# Patient Record
Sex: Female | Born: 1955 | Race: Black or African American | Hispanic: No | Marital: Married | Smoking: Never smoker
Health system: Southern US, Community
[De-identification: ages and names within clinical notes are randomized; demographics above are authoritative.]

## PROBLEM LIST (undated history)

## (undated) DIAGNOSIS — E119 Type 2 diabetes mellitus without complications: Secondary | ICD-10-CM

## (undated) DIAGNOSIS — E079 Disorder of thyroid, unspecified: Secondary | ICD-10-CM

## (undated) DIAGNOSIS — I10 Essential (primary) hypertension: Secondary | ICD-10-CM

## (undated) HISTORY — PX: BREAST BIOPSY: SHX20

## (undated) HISTORY — PX: ABDOMINAL HYSTERECTOMY: SHX81

## (undated) HISTORY — PX: TUBAL LIGATION: SHX77

---

## 2007-10-19 ENCOUNTER — Ambulatory Visit: Payer: Self-pay

## 2008-11-12 ENCOUNTER — Inpatient Hospital Stay (HOSPITAL_COMMUNITY): Admission: EM | Admit: 2008-11-12 | Discharge: 2008-11-14 | Payer: Self-pay | Admitting: Emergency Medicine

## 2011-01-07 LAB — BRAIN NATRIURETIC PEPTIDE: Pro B Natriuretic peptide (BNP): 35 pg/mL (ref 0.0–100.0)

## 2011-01-07 LAB — CBC
HCT: 32.7 % — ABNORMAL LOW (ref 36.0–46.0)
HCT: 40.6 % (ref 36.0–46.0)
Hemoglobin: 11.2 g/dL — ABNORMAL LOW (ref 12.0–15.0)
Hemoglobin: 11.8 g/dL — ABNORMAL LOW (ref 12.0–15.0)
Hemoglobin: 13.9 g/dL (ref 12.0–15.0)
MCHC: 34.1 g/dL (ref 30.0–36.0)
MCHC: 34.2 g/dL (ref 30.0–36.0)
MCHC: 34.8 g/dL (ref 30.0–36.0)
MCV: 88.6 fL (ref 78.0–100.0)
MCV: 89.2 fL (ref 78.0–100.0)
MCV: 89.3 fL (ref 78.0–100.0)
Platelets: 265 10*3/uL (ref 150–400)
RBC: 3.66 MIL/uL — ABNORMAL LOW (ref 3.87–5.11)
RBC: 3.81 MIL/uL — ABNORMAL LOW (ref 3.87–5.11)
RBC: 4.55 MIL/uL (ref 3.87–5.11)
RDW: 13.4 % (ref 11.5–15.5)
WBC: 3.4 10*3/uL — ABNORMAL LOW (ref 4.0–10.5)
WBC: 4.2 10*3/uL (ref 4.0–10.5)

## 2011-01-07 LAB — CULTURE, BLOOD (ROUTINE X 2)
Culture: NO GROWTH
Culture: NO GROWTH

## 2011-01-07 LAB — COMPREHENSIVE METABOLIC PANEL
ALT: 16 U/L (ref 0–35)
BUN: 15 mg/dL (ref 6–23)
CO2: 22 mEq/L (ref 19–32)
Calcium: 8.8 mg/dL (ref 8.4–10.5)
GFR calc non Af Amer: 58 mL/min — ABNORMAL LOW (ref >60)
Glucose, Bld: 105 mg/dL — ABNORMAL HIGH (ref 70–99)
Sodium: 140 mEq/L (ref 135–145)

## 2011-01-07 LAB — URINE CULTURE: Colony Count: >=100000

## 2011-01-07 LAB — CK TOTAL AND CKMB (NOT AT ARMC)
CK, MB: 1.3 ng/mL (ref 0.3–4.0)
Relative Index: 0.7 (ref 0.0–2.5)
Relative Index: 0.9 (ref 0.0–2.5)

## 2011-01-07 LAB — BASIC METABOLIC PANEL
CO2: 22 mEq/L (ref 19–32)
CO2: 24 mEq/L (ref 19–32)
Calcium: 8.2 mg/dL — ABNORMAL LOW (ref 8.4–10.5)
Chloride: 110 mEq/L (ref 96–112)
Creatinine, Ser: 0.91 mg/dL (ref 0.4–1.2)
GFR calc Af Amer: >60 mL/min (ref >60)
GFR calc Af Amer: >60 mL/min (ref >60)
Potassium: 3.2 mEq/L — ABNORMAL LOW (ref 3.5–5.1)
Sodium: 136 mEq/L (ref 135–145)

## 2011-01-07 LAB — HEMOCCULT GUIAC POC 1CARD (OFFICE)
Fecal Occult Bld: NEGATIVE
Fecal Occult Bld: POSITIVE

## 2011-01-07 LAB — URINALYSIS, ROUTINE W REFLEX MICROSCOPIC
Bilirubin Urine: NEGATIVE
Hgb urine dipstick: NEGATIVE
Nitrite: NEGATIVE
Protein, ur: NEGATIVE mg/dL
Specific Gravity, Urine: 1.009 (ref 1.005–1.030)
Urobilinogen, UA: 0.2 mg/dL (ref 0.0–1.0)

## 2011-01-07 LAB — DIFFERENTIAL
Basophils Absolute: 0 10*3/uL (ref 0.0–0.1)
Eosinophils Relative: 0 % (ref 0–5)
Monocytes Absolute: 0.4 10*3/uL (ref 0.1–1.0)
Monocytes Relative: 3 % (ref 3–12)
Neutro Abs: 11.8 10*3/uL — ABNORMAL HIGH (ref 1.7–7.7)
Neutrophils Relative %: 94 % — ABNORMAL HIGH (ref 43–77)

## 2011-01-07 LAB — APTT: aPTT: 31 seconds (ref 24–37)

## 2011-01-07 LAB — TROPONIN I: Troponin I: <0.01 ng/mL (ref 0.00–0.06)

## 2011-01-07 LAB — PROTIME-INR: INR: 1 (ref 0.00–1.49)

## 2011-02-04 NOTE — Discharge Summary (Signed)
Rebekah Salinas, RINER            ACCOUNT NO.:  [id number]   MEDICAL RECORD NO.:  [id number]          PATIENT TYPE:  INP   LOCATION:  3707                         FACILITY:  [facility]   PHYSICIAN:  Richarda Overlie, MD       DATE OF BIRTH:  1956-09-12   DATE OF ADMISSION:  11/11/2008  DATE OF DISCHARGE:  11/14/2008                               DISCHARGE SUMMARY   DISCHARGE DIAGNOSES:  1. Community-acquired pneumonia.  2. Syncope secondary to dehydration.  3. Elevated D-dimer secondary to pneumonia, negative for pulmonary      embolus.   SUBJECTIVE:  1. This is a 55 year old female who was brought in by her daughter for      further evaluation after a presyncopal episode.  The patient      reported nausea and vomiting following the event.  The patient on      initial assessment was found to have mild tachypnea and a fever of      101.8 in the ER.  Initial chest x-ray was negative but revealed      mild bibasilar atelectasis.  The patient had an elevated D-dimer      and therefore CT angiogram of the chest was done that showed no      evidence of acute pulmonary embolism and possible linear      atelectasis and mild interstitial edema was difficult to exclude.      The patient was presumed to have a community-acquired pneumonia as      she was febrile with an elevated white count of 12.7 and was      treated with ceftriaxone and azithromycin, to which she responded      well.  She had a urinalysis that was negative.  The patient is to      continue with Avelox 400 p.o. daily for another 5 days.  2. Syncopal episode thought to be secondary to dehydration:  The      patient had an initial blood pressure which was normal following      admission.  The patient was found to have sinus tachycardia as well      as orthostatics which were mildly positive, and the patient was      hydrated aggressively with IV fluids.  She had an MRI/MRA of the      brain done that was essentially negative without  any acute      intracranial abnormality.  A CT angio was done that was negative      for PE.  Serial troponins were negative.  Telemetry showed one      episode of sinus tachycardia with heart rate in the 120s, probably      related to the episodes of fever and nausea and vomiting.  This      subsequently resolved with IV hydration.  3. Anemia:  The patient was notified that her hemoglobin was 11.2 and      a fecal occult blood testing was positive and a follow-up      colonoscopy strongly recommended.   DISCHARGE MEDICATIONS:  1. Avelox 100 mg p.o. daily x5  days.  2. Prilosec 20 mg p.o. daily.  3. Senna/Colace 1 tablet p.o. q. 12.  4. Zofran 4 mg p.o. q. 12 p.r.n. nausea.  5. K-Dur 40 mEq p.o. daily x5 days.-up.   FOLLOW-UP RECOMMENDATIONS:  Patient recommended to drink plenty of  fluids and increase activity gradually and set up a PCP appointment in 5-  10 days.      Richarda Overlie, MD  Electronically Signed     NA/MEDQ  D:  11/14/2008  T:  11/14/2008  Job:  [id number]

## 2011-02-04 NOTE — H&P (Signed)
NAMEMINH, JASPER            ACCOUNT NO.:  [id number]   MEDICAL RECORD NO.:  [id number]          PATIENT TYPE:  INP   LOCATION:  3707                         FACILITY:  [facility]   PHYSICIAN:  Della Goo, M.D. DATE OF BIRTH:  1955/12/10   DATE OF ADMISSION:  11/11/2008  DATE OF DISCHARGE:                              HISTORY & PHYSICAL   PRIMARY CARE PHYSICIAN:  Unassigned.  Patient receives her primary care  in [place], [place].   CHIEF COMPLAINT:  Felt faint, dizzy.   HISTORY OF PRESENT ILLNESS:  This is a 55 year old female who was in the  mall with her daughter in the afternoon and she began to feel faint and  almost passed out.  Patient reports having nausea and 2 episodes of  vomiting following the event.  She denied having any headache prior to  the event but reports having a headache afterward.  She denies having  any previous fevers, chills, congestion.  She also denies having any  chest pain prior to or after the event.  Patient did report feeling weak  and dizzy.   PAST MEDICAL HISTORY:  None.   PAST SURGICAL HISTORY:  History of a hysterectomy.   MEDICATIONS:  None.   ALLERGIES:  ASPIRIN WHICH CAUSED SWELLING.   SOCIAL HISTORY:  Patient is a nonsmoker, nondrinker.   FAMILY HISTORY:  Noncontributory.   REVIEW OF SYSTEMS:  Pertinents are mentioned above.   PHYSICAL EXAMINATION FINDINGS:  This is a 55 year old, mildly  overweight, well-developed female in discomfort but no acute distress.  VITAL SIGNS ON ADMISSION:  Temperature 97.5.  Blood pressure 123/75.  Heart rate 94.  Respirations 36 and now they are 24.  Her temperature  did increase while in the emergency department to 101.8.  Her O2  saturations were 98% to 99%.  HEENT EXAMINATION:  Normocephalic, atraumatic.  There is no scleral  icterus.  Pupils are equally round and reactive to light.  Extraocular  movements are intact.  Funduscopic benign.  Oropharynx is clear.  NECK:  Supple.   Full range of motion.  No thyromegaly, adenopathy, or  jugular venous distention.  CARDIOVASCULAR:  Regular rate and rhythm.  No murmurs, gallops, or rubs.  LUNGS:  Clear to auscultation bilaterally.  ABDOMEN:  Positive bowel sounds.  Soft, nontender, nondistended.  Extremities:  Without cyanosis, clubbing, or edema.  NEUROLOGIC EXAMINATION:  Patient is alert and oriented x3.  There are no  focal deficits on examination.   LABORATORY STUDIES:  White blood cell count 12.7, hemoglobin 13.9,  hematocrit 40.6, MCV 89.2, platelets 336, neutrophils 94%, lymphocytes  3%.  Sodium 140, potassium 3.7, chloride 109, carbon dioxide 22, BUN 15,  creatinine 1.0, and glucose 105.  Albumin 3.8, AST 21, ALT 16.  D-dimer  0.54.  Pro time 13.6, INR 1.0, and PTT 31.  The portable chest x-ray  revealed an opacity at the left lung base.  A 2-view chest x-ray was  recommended.  The repeat x-ray, 2 view, revealed mild bibasilar  atelectasis.  Patient also had a CT angiogram study based on her  elevated D-dimer study and results of the  CT angiogram were negative for  a pulmonary embolism.  Also did reveal mild interstitial edema and  linear atelectasis at the bases.   ASSESSMENT:  A 55 year old female being admitted with:  1. Presyncope.  2. Early pneumonia and this is based on the elevated white blood cell      count with the left shift and the x-ray report.  3. Hypotension.  4. Elevated D-dimer.   PLAN:  Patient will be admitted to a telemetry area for cardiac  monitoring.  Cardiac enzymes will be performed and a syncope workup will  be started.  Patient will be placed on IV antibiotic therapy of Rocephin  and azithromycin for now.  Supplemental oxygen has been ordered as  needed.  Patient will be placed on DVT and GI prophylaxis and further  workup will ensue pending results of patient's studies, as well as her  clinical course.      Della Goo, M.D.  Electronically Signed     HJ/MEDQ  D:   11/12/2008  T:  11/12/2008  Job:  [id number]

## 2012-05-04 ENCOUNTER — Ambulatory Visit: Payer: Self-pay

## 2012-09-07 ENCOUNTER — Ambulatory Visit: Payer: Self-pay | Admitting: Internal Medicine

## 2012-10-08 ENCOUNTER — Ambulatory Visit: Payer: Self-pay | Admitting: Internal Medicine

## 2013-03-14 ENCOUNTER — Encounter: Payer: Self-pay | Admitting: Nurse Practitioner

## 2013-04-06 ENCOUNTER — Ambulatory Visit: Payer: Self-pay | Admitting: Pain Medicine

## 2013-05-03 ENCOUNTER — Ambulatory Visit: Payer: Self-pay | Admitting: Pain Medicine

## 2013-05-03 LAB — SEDIMENTATION RATE: Erythrocyte Sed Rate: 19 mm/hr (ref 0–30)

## 2013-05-09 ENCOUNTER — Ambulatory Visit: Payer: Self-pay | Admitting: Pain Medicine

## 2013-05-20 ENCOUNTER — Ambulatory Visit: Payer: Self-pay | Admitting: Oncology

## 2013-05-20 LAB — CBC CANCER CENTER
Basophil %: 0.6 %
Eosinophil #: 0.1 x10 3/mm (ref 0.0–0.7)
HCT: 39 % (ref 35.0–47.0)
HGB: 13 g/dL (ref 12.0–16.0)
Lymphocyte #: 1.6 x10 3/mm (ref 1.0–3.6)
MCH: 28.4 pg (ref 26.0–34.0)
MCHC: 33.4 g/dL (ref 32.0–36.0)
Monocyte %: 7.8 %
Neutrophil #: 3.5 x10 3/mm (ref 1.4–6.5)
RBC: 4.59 10*6/uL (ref 3.80–5.20)

## 2013-05-23 ENCOUNTER — Ambulatory Visit: Payer: Self-pay | Admitting: Oncology

## 2013-05-31 ENCOUNTER — Ambulatory Visit: Payer: Self-pay | Admitting: Oncology

## 2013-06-22 ENCOUNTER — Ambulatory Visit: Payer: Self-pay | Admitting: Oncology

## 2013-07-05 ENCOUNTER — Ambulatory Visit: Payer: Self-pay | Admitting: Gastroenterology

## 2013-07-21 ENCOUNTER — Ambulatory Visit: Payer: Self-pay | Admitting: Gastroenterology

## 2013-07-23 ENCOUNTER — Ambulatory Visit: Payer: Self-pay | Admitting: Oncology

## 2013-10-10 ENCOUNTER — Ambulatory Visit: Payer: Self-pay | Admitting: Nurse Practitioner

## 2014-10-17 ENCOUNTER — Ambulatory Visit: Payer: Self-pay | Admitting: Nurse Practitioner

## 2015-11-13 ENCOUNTER — Other Ambulatory Visit: Payer: Self-pay | Admitting: Nurse Practitioner

## 2015-11-13 DIAGNOSIS — Z1231 Encounter for screening mammogram for malignant neoplasm of breast: Secondary | ICD-10-CM

## 2015-11-21 ENCOUNTER — Ambulatory Visit
Admission: RE | Admit: 2015-11-21 | Discharge: 2015-11-21 | Disposition: A | Discharge status: Home / Self Care | Payer: Medicaid Other | Source: Ambulatory Visit | Attending: Nurse Practitioner | Admitting: Nurse Practitioner

## 2015-11-21 DIAGNOSIS — Z1231 Encounter for screening mammogram for malignant neoplasm of breast: Secondary | ICD-10-CM | POA: Diagnosis present

## 2016-04-08 ENCOUNTER — Encounter: Payer: Self-pay | Admitting: Emergency Medicine

## 2016-04-08 ENCOUNTER — Emergency Department
Admission: EM | Admit: 2016-04-08 | Discharge: 2016-04-08 | Disposition: A | Discharge status: Home / Self Care | Payer: Medicaid Other | Attending: Emergency Medicine | Admitting: Emergency Medicine

## 2016-04-08 ENCOUNTER — Emergency Department: Payer: Medicaid Other

## 2016-04-08 DIAGNOSIS — R52 Pain, unspecified: Secondary | ICD-10-CM

## 2016-04-08 DIAGNOSIS — R200 Anesthesia of skin: Secondary | ICD-10-CM

## 2016-04-08 DIAGNOSIS — R202 Paresthesia of skin: Secondary | ICD-10-CM

## 2016-04-08 DIAGNOSIS — M79661 Pain in right lower leg: Secondary | ICD-10-CM | POA: Diagnosis present

## 2016-04-08 DIAGNOSIS — M79604 Pain in right leg: Secondary | ICD-10-CM

## 2016-04-08 MED ORDER — BACLOFEN 10 MG PO TABS: 10.0000 mg | ORAL_TABLET | Freq: Three times a day (TID) | ORAL | Status: DC

## 2016-04-08 MED ORDER — NAPROXEN 500 MG PO TABS: 500.0000 mg | ORAL_TABLET | Freq: Two times a day (BID) | ORAL | Status: DC

## 2016-04-08 NOTE — Discharge Instructions (Signed)
Heat Therapy °Heat therapy can help ease sore, stiff, injured, and tight muscles and joints. Heat relaxes your muscles, which may help ease your pain.  °RISKS AND COMPLICATIONS °If you have any of the following conditions, do not use heat therapy unless your health care provider has approved: °· Poor circulation. °· Healing wounds or scarred skin in the area being treated. °· Diabetes, heart disease, or high blood pressure. °· Not being able to feel (numbness) the area being treated. °· Unusual swelling of the area being treated. °· Active infections. °· Blood clots. °· Cancer. °· Inability to communicate pain. This may include young children and people who have problems with their brain function (dementia). °· Pregnancy. °Heat therapy should only be used on old, pre-existing, or long-lasting (chronic) injuries. Do not use heat therapy on new injuries unless directed by your health care provider. °HOW TO USE HEAT THERAPY °There are several different kinds of heat therapy, including: °· Moist heat pack. °· Warm water bath. °· Hot water bottle. °· Electric heating pad. °· Heated gel pack. °· Heated wrap. °· Electric heating pad. °Use the heat therapy method suggested by your health care provider. Follow your health care provider's instructions on when and how to use heat therapy. °GENERAL HEAT THERAPY RECOMMENDATIONS °· Do not sleep while using heat therapy. Only use heat therapy while you are awake. °· Your skin may turn pink while using heat therapy. Do not use heat therapy if your skin turns red. °· Do not use heat therapy if you have new pain. °· High heat or long exposure to heat can cause burns. Be careful when using heat therapy to avoid burning your skin. °· Do not use heat therapy on areas of your skin that are already irritated, such as with a rash or sunburn. °SEEK MEDICAL CARE IF: °· You have blisters, redness, swelling, or numbness. °· You have new pain. °· Your pain is worse. °MAKE SURE  YOU: °· Understand these instructions. °· Will watch your condition. °· Will get help right away if you are not doing well or get worse. °  °This information is not intended to replace advice given to you by your health care provider. Make sure you discuss any questions you have with your health care provider. °  °Document Released: 12/01/2011 Document Revised: 09/29/2014 Document Reviewed: 11/01/2013 °Elsevier Interactive Patient Education ©2016 [employer] ° °Musculoskeletal Pain °Musculoskeletal pain is muscle and boney aches and pains. These pains can occur in any part of the body. Your caregiver may treat you without knowing the cause of the pain. They may treat you if blood or urine tests, X-rays, and other tests were normal.  °CAUSES °There is often not a definite cause or reason for these pains. These pains may be caused by a type of germ (virus). The discomfort may also come from overuse. Overuse includes working out too hard when your body is not fit. Boney aches also come from weather changes. Bone is sensitive to atmospheric pressure changes. °HOME CARE INSTRUCTIONS  °· Ask when your test results will be ready. Make sure you get your test results. °· Only take over-the-counter or prescription medicines for pain, discomfort, or fever as directed by your caregiver. If you were given medications for your condition, do not drive, operate machinery or power tools, or sign legal documents for 24 hours. Do not drink alcohol. Do not take sleeping pills or other medications that may interfere with treatment. °· Continue all activities unless the activities cause   more pain. When the pain lessens, slowly resume normal activities. Gradually increase the intensity and duration of the activities or exercise. °· During periods of severe pain, bed rest may be helpful. Lay or sit in any position that is comfortable. °· Putting ice on the injured area. °¨ Put ice in a bag. °¨ Place a towel between your skin and the  bag. °¨ Leave the ice on for 15 to 20 minutes, 3 to 4 times a day. °· Follow up with your caregiver for continued problems and no reason can be found for the pain. If the pain becomes worse or does not go away, it may be necessary to repeat tests or do additional testing. Your caregiver may need to look further for a possible cause. °SEEK IMMEDIATE MEDICAL CARE IF: °· You have pain that is getting worse and is not relieved by medications. °· You develop chest pain that is associated with shortness or breath, sweating, feeling sick to your stomach (nauseous), or throw up (vomit). °· Your pain becomes localized to the abdomen. °· You develop any new symptoms that seem different or that concern you. °MAKE SURE YOU:  °· Understand these instructions. °· Will watch your condition. °· Will get help right away if you are not doing well or get worse. °  °This information is not intended to replace advice given to you by your health care provider. Make sure you discuss any questions you have with your health care provider. °  °Document Released: 09/08/2005 Document Revised: 12/01/2011 Document Reviewed: 05/13/2013 °Elsevier Interactive Patient Education ©2016 [employer] ° °

## 2016-04-08 NOTE — ED Notes (Signed)
Confirmed with patient and provider that RLE is area of concern. Correction from triage note

## 2016-04-08 NOTE — ED Notes (Signed)
Reports LLE pain onset this am.  Ambulates well. +pulse in foot.  No redness or swelling noted.

## 2016-04-08 NOTE — ED Provider Notes (Signed)
[facility] Emergency Department Provider Note   ____________________________________________  Time seen: Approximately 9:23 AM  I have reviewed the triage vital signs and the nursing notes.   HISTORY  Chief Complaint Leg Pain    HPI Rebekah Salinas is a 60 y.o. female who presents today for evaluation of right lower leg pain. Patient states that when she woke up this AM she noticed that her right leg seemed cooler to touch than her left leg. She states that when she rubs the right leg it feels different from the left leg. She is able to ambulate without change in gait, but she has noticed some numbness while walking. Patient denies any recent travel or recent surgery/trauma. She denies cp, sob, or cough. Pain is a 5/10.   History reviewed. No pertinent past medical history.  There are no active problems to display for this patient.   Past Surgical History  Procedure Laterality Date  . Breast biopsy Left     neg  . Breast biopsy Left     core bx- neg    Current Outpatient Rx  Name  Route  Sig  Dispense  Refill  . baclofen (LIORESAL) 10 MG tablet   Oral   Take 1 tablet (10 mg total) by mouth 3 (three) times daily.   30 tablet   0     Allergies Review of patient's allergies indicates no known allergies.  Family History  Problem Relation Age of Onset  . Breast cancer Neg Hx     Social History Social History  Substance Use Topics  . Smoking status: Never Smoker   . Smokeless tobacco: None  . Alcohol Use: None    Review of Systems Constitutional: No fever/chills Eyes: No visual changes. ENT: No sore throat. Cardiovascular: Denies chest pain. Respiratory: Denies shortness of breath. Gastrointestinal: No abdominal pain.  No nausea, no vomiting.  No diarrhea.  No constipation. Genitourinary: Negative for dysuria. Musculoskeletal: Negative for back pain. Moderate pain in right lower leg. Patient states that the right extremity is  cooler to touch than the left Skin: Negative for rash. Neurological: Negative for headaches, focal weakness or numbness.  10-point ROS otherwise negative.  ____________________________________________   PHYSICAL EXAM:  VITAL SIGNS: ED Triage Vitals  Enc Vitals Group     BP 04/08/16 0856 137/88 mmHg     Pulse Rate 04/08/16 0856 90     Resp 04/08/16 0856 18     Temp 04/08/16 0856 98.4 F (36.9 C)     Temp Source 04/08/16 0856 Oral     SpO2 04/08/16 0856 97 %     Weight 04/08/16 0856 204 lb (92.534 kg)     Height 04/08/16 0856 5\' 3"  (1.6 m)     Head Cir --      Peak Flow --      Pain Score 04/08/16 0857 5     Pain Loc --      Pain Edu? --      Excl. in GC? --     Constitutional: Alert and oriented. Well appearing and in no acute distress. Eyes: Conjunctivae are normal. PERRL. EOMI. Head: Atraumatic. Nose: No congestion/rhinnorhea. Mouth/Throat: Mucous membranes are moist.  Oropharynx non-erythematous. Neck: No stridor.   Cardiovascular: Normal rate, regular rhythm. Grossly normal heart sounds.  Good peripheral circulation. Respiratory: Normal respiratory effort.  No retractions. Lungs CTAB. Gastrointestinal: Soft and nontender. No distention. No abdominal bruits. No CVA tenderness. Musculoskeletal: Moderate pain to palpation of right calf muscle.  No swelling or redness appreciated Neurologic:  Normal speech and language. No gross focal neurologic deficits are appreciated. No gait instability. Skin:  Skin is warm, dry and intact. No rash noted. Psychiatric: Mood and affect are normal. Speech and behavior are normal.  ____________________________________________   LABS (all labs ordered are listed, but only abnormal results are displayed)  Labs Reviewed - No data to display ____________________________________________  EKG  Not indicated ____________________________________________  RADIOLOGY  [place] No DVT  visualized ____________________________________________   PROCEDURES  Procedure(s) performed: No  Procedures  Critical Care performed: No  ____________________________________________   INITIAL IMPRESSION / ASSESSMENT AND PLAN / ED COURSE  Pertinent labs & imaging results that were available during my care of the patient were reviewed by me and considered in my medical decision making (see chart for details).  Patient has been reassured that she does not have a DVT. She has been instructed to follow up with orthopedics or her PCP if the symptoms worsen. She has been advised to be aware of SOB or chest pain. Patient is agreeable. Pt prescribed Bactroban. ____________________________________________   FINAL CLINICAL IMPRESSION(S) / ED DIAGNOSES  Final diagnoses:  Pain of right lower extremity      NEW MEDICATIONS STARTED DURING THIS VISIT:  New Prescriptions   BACLOFEN (LIORESAL) 10 MG TABLET    Take 1 tablet (10 mg total) by mouth 3 (three) times daily.     Note:  This document was prepared using Dragon voice recognition software and may include unintentional dictation errors.   Evangeline Dakin, PA-C 04/08/16 1228  Charlynne Pander, MD 04/08/16 [phone]

## 2016-06-02 ENCOUNTER — Encounter: Payer: Self-pay | Admitting: Emergency Medicine

## 2016-06-02 ENCOUNTER — Observation Stay
Admission: EM | Admit: 2016-06-02 | Discharge: 2016-06-03 | Disposition: A | Discharge status: Home / Self Care | Payer: Medicaid Other | Attending: Internal Medicine | Admitting: Internal Medicine

## 2016-06-02 ENCOUNTER — Emergency Department: Payer: Medicaid Other

## 2016-06-02 DIAGNOSIS — I10 Essential (primary) hypertension: Secondary | ICD-10-CM | POA: Insufficient documentation

## 2016-06-02 DIAGNOSIS — Z79899 Other long term (current) drug therapy: Secondary | ICD-10-CM | POA: Insufficient documentation

## 2016-06-02 DIAGNOSIS — I208 Other forms of angina pectoris: Secondary | ICD-10-CM

## 2016-06-02 DIAGNOSIS — R079 Chest pain, unspecified: Secondary | ICD-10-CM | POA: Diagnosis present

## 2016-06-02 DIAGNOSIS — Z90710 Acquired absence of both cervix and uterus: Secondary | ICD-10-CM | POA: Insufficient documentation

## 2016-06-02 DIAGNOSIS — R072 Precordial pain: Secondary | ICD-10-CM | POA: Diagnosis present

## 2016-06-02 DIAGNOSIS — E119 Type 2 diabetes mellitus without complications: Secondary | ICD-10-CM | POA: Diagnosis not present

## 2016-06-02 DIAGNOSIS — E785 Hyperlipidemia, unspecified: Secondary | ICD-10-CM | POA: Insufficient documentation

## 2016-06-02 DIAGNOSIS — E039 Hypothyroidism, unspecified: Secondary | ICD-10-CM | POA: Diagnosis not present

## 2016-06-02 DIAGNOSIS — Z9889 Other specified postprocedural states: Secondary | ICD-10-CM | POA: Insufficient documentation

## 2016-06-02 DIAGNOSIS — Z8249 Family history of ischemic heart disease and other diseases of the circulatory system: Secondary | ICD-10-CM | POA: Insufficient documentation

## 2016-06-02 DIAGNOSIS — Z886 Allergy status to analgesic agent status: Secondary | ICD-10-CM | POA: Insufficient documentation

## 2016-06-02 DIAGNOSIS — I2089 Other forms of angina pectoris: Secondary | ICD-10-CM

## 2016-06-02 DIAGNOSIS — N179 Acute kidney failure, unspecified: Secondary | ICD-10-CM | POA: Insufficient documentation

## 2016-06-02 HISTORY — DX: Essential (primary) hypertension: I10

## 2016-06-02 HISTORY — DX: Disorder of thyroid, unspecified: E07.9

## 2016-06-02 HISTORY — DX: Type 2 diabetes mellitus without complications: E11.9

## 2016-06-02 LAB — CBC WITH DIFFERENTIAL/PLATELET
BASOS PCT: 1 %
Basophils Absolute: 0.1 10*3/uL (ref 0–0.1)
Eosinophils Absolute: 0.1 10*3/uL (ref 0–0.7)
Eosinophils Relative: 2 %
HCT: 39 % (ref 35.0–47.0)
HEMOGLOBIN: 13.3 g/dL (ref 12.0–16.0)
LYMPHS ABS: 2.5 10*3/uL (ref 1.0–3.6)
LYMPHS PCT: 40 %
MCH: 29.2 pg (ref 26.0–34.0)
MCHC: 34.2 g/dL (ref 32.0–36.0)
MCV: 85.3 fL (ref 80.0–100.0)
MONO ABS: 0.7 10*3/uL (ref 0.2–0.9)
MONOS PCT: 10 %
NEUTROS ABS: 3 10*3/uL (ref 1.4–6.5)
NEUTROS PCT: 47 %
PLATELETS: 338 10*3/uL (ref 150–440)
RBC: 4.57 MIL/uL (ref 3.80–5.20)
RDW: 14.2 % (ref 11.5–14.5)
WBC: 6.2 10*3/uL (ref 3.6–11.0)

## 2016-06-02 LAB — COMPREHENSIVE METABOLIC PANEL
ALBUMIN: 3.8 g/dL (ref 3.5–5.0)
ALT: 17 U/L (ref 14–54)
ANION GAP: 10 (ref 5–15)
AST: 20 U/L (ref 15–41)
Alkaline Phosphatase: 68 U/L (ref 38–126)
BUN: 19 mg/dL (ref 6–20)
CHLORIDE: 105 mmol/L (ref 101–111)
CO2: 24 mmol/L (ref 22–32)
Calcium: 9.2 mg/dL (ref 8.9–10.3)
Creatinine, Ser: 1.25 mg/dL — ABNORMAL HIGH (ref 0.44–1.00)
GFR calc Af Amer: 53 mL/min — ABNORMAL LOW (ref >60)
GFR calc non Af Amer: 46 mL/min — ABNORMAL LOW (ref >60)
GLUCOSE: 106 mg/dL — AB (ref 65–99)
POTASSIUM: 3.3 mmol/L — AB (ref 3.5–5.1)
SODIUM: 139 mmol/L (ref 135–145)
TOTAL PROTEIN: 7 g/dL (ref 6.5–8.1)
Total Bilirubin: 0.4 mg/dL (ref 0.3–1.2)

## 2016-06-02 LAB — TROPONIN I
Troponin I: <0.03 ng/mL (ref <0.03)
Troponin I: <0.03 ng/mL (ref <0.03)
Troponin I: <0.03 ng/mL (ref <0.03)

## 2016-06-02 LAB — LIPASE, BLOOD: Lipase: 25 U/L (ref 11–51)

## 2016-06-02 LAB — TSH: TSH: 4.111 u[IU]/mL (ref 0.350–4.500)

## 2016-06-02 LAB — HEMOGLOBIN A1C: Hgb A1c MFr Bld: 5.9 % (ref 4.0–6.0)

## 2016-06-02 MED ORDER — SODIUM CHLORIDE 0.9 % IV BOLUS (SEPSIS)
1000.0000 mL | Freq: Once | INTRAVENOUS | Status: AC
  Administered 2016-06-02: 1000 mL via INTRAVENOUS

## 2016-06-02 MED ORDER — POTASSIUM CHLORIDE CRYS ER 20 MEQ PO TBCR
40.0000 meq | EXTENDED_RELEASE_TABLET | ORAL | Status: AC
  Administered 2016-06-02: 40 meq via ORAL
  Filled 2016-06-02: qty 2

## 2016-06-02 MED ORDER — SIMVASTATIN 20 MG PO TABS
10.0000 mg | ORAL_TABLET | Freq: Every evening | ORAL | Status: DC
  Administered 2016-06-02: 10 mg via ORAL
  Filled 2016-06-02: qty 1

## 2016-06-02 MED ORDER — ACETAMINOPHEN 325 MG PO TABS
650.0000 mg | ORAL_TABLET | Freq: Four times a day (QID) | ORAL | Status: DC | PRN
  Administered 2016-06-02 – 2016-06-03 (×2): 650 mg via ORAL
  Filled 2016-06-02 (×2): qty 2

## 2016-06-02 MED ORDER — LISINOPRIL 5 MG PO TABS: 5.0000 mg | ORAL_TABLET | Freq: Every day | ORAL | Status: DC

## 2016-06-02 MED ORDER — LEVOTHYROXINE SODIUM 88 MCG PO TABS
88.0000 ug | ORAL_TABLET | Freq: Every day | ORAL | Status: DC
  Administered 2016-06-02 – 2016-06-03 (×2): 88 ug via ORAL
  Filled 2016-06-02 (×2): qty 1

## 2016-06-02 MED ORDER — ONDANSETRON HCL 4 MG/2ML IJ SOLN: 4.0000 mg | Freq: Four times a day (QID) | INTRAMUSCULAR | Status: DC | PRN

## 2016-06-02 MED ORDER — BACLOFEN 10 MG PO TABS
10.0000 mg | ORAL_TABLET | Freq: Three times a day (TID) | ORAL | Status: DC
  Administered 2016-06-02 – 2016-06-03 (×4): 10 mg via ORAL
  Filled 2016-06-02 (×6): qty 1

## 2016-06-02 MED ORDER — CYCLOBENZAPRINE HCL 10 MG PO TABS
10.0000 mg | ORAL_TABLET | Freq: Two times a day (BID) | ORAL | Status: DC
  Administered 2016-06-02 (×2): 10 mg via ORAL
  Filled 2016-06-02 (×3): qty 1

## 2016-06-02 MED ORDER — PRIMIDONE 50 MG PO TABS
50.0000 mg | ORAL_TABLET | Freq: Two times a day (BID) | ORAL | Status: DC
  Administered 2016-06-02 – 2016-06-03 (×3): 50 mg via ORAL
  Filled 2016-06-02 (×4): qty 1

## 2016-06-02 MED ORDER — PNEUMOCOCCAL VAC POLYVALENT 25 MCG/0.5ML IJ INJ
0.5000 mL | INJECTION | INTRAMUSCULAR | Status: DC
  Filled 2016-06-02: qty 0.5

## 2016-06-02 MED ORDER — ONDANSETRON HCL 4 MG PO TABS: 4.0000 mg | ORAL_TABLET | Freq: Four times a day (QID) | ORAL | Status: DC | PRN

## 2016-06-02 MED ORDER — PANTOPRAZOLE SODIUM 40 MG PO TBEC
40.0000 mg | DELAYED_RELEASE_TABLET | Freq: Every day | ORAL | Status: DC
  Administered 2016-06-02 – 2016-06-03 (×2): 40 mg via ORAL
  Filled 2016-06-02 (×2): qty 1

## 2016-06-02 MED ORDER — METOCLOPRAMIDE HCL 5 MG/ML IJ SOLN
10.0000 mg | Freq: Once | INTRAMUSCULAR | Status: AC
  Administered 2016-06-02: 10 mg via INTRAVENOUS
  Filled 2016-06-02: qty 2

## 2016-06-02 MED ORDER — POTASSIUM CHLORIDE IN NACL 20-0.9 MEQ/L-% IV SOLN
INTRAVENOUS | Status: DC
  Administered 2016-06-02: 08:00:00 via INTRAVENOUS
  Filled 2016-06-02 (×3): qty 1000

## 2016-06-02 MED ORDER — DOCUSATE SODIUM 100 MG PO CAPS
100.0000 mg | ORAL_CAPSULE | Freq: Two times a day (BID) | ORAL | Status: DC
  Administered 2016-06-02 – 2016-06-03 (×3): 100 mg via ORAL
  Filled 2016-06-02 (×3): qty 1

## 2016-06-02 MED ORDER — ACETAMINOPHEN 500 MG PO TABS
1000.0000 mg | ORAL_TABLET | Freq: Once | ORAL | Status: AC
  Administered 2016-06-02: 1000 mg via ORAL
  Filled 2016-06-02: qty 2

## 2016-06-02 MED ORDER — ENOXAPARIN SODIUM 40 MG/0.4ML ~~LOC~~ SOLN
40.0000 mg | SUBCUTANEOUS | Status: DC
  Administered 2016-06-02: 40 mg via SUBCUTANEOUS
  Filled 2016-06-02: qty 0.4

## 2016-06-02 MED ORDER — CLOPIDOGREL BISULFATE 75 MG PO TABS
300.0000 mg | ORAL_TABLET | Freq: Once | ORAL | Status: AC
  Administered 2016-06-02: 300 mg via ORAL
  Filled 2016-06-02: qty 4

## 2016-06-02 MED ORDER — SODIUM CHLORIDE 0.9% FLUSH
3.0000 mL | Freq: Two times a day (BID) | INTRAVENOUS | Status: DC
  Administered 2016-06-02 – 2016-06-03 (×3): 3 mL via INTRAVENOUS

## 2016-06-02 MED ORDER — ACETAMINOPHEN 650 MG RE SUPP: 650.0000 mg | Freq: Four times a day (QID) | RECTAL | Status: DC | PRN

## 2016-06-02 MED ORDER — NITROGLYCERIN 0.4 MG SL SUBL: 0.4000 mg | SUBLINGUAL_TABLET | SUBLINGUAL | Status: DC | PRN

## 2016-06-02 MED ORDER — OXYCODONE HCL 5 MG PO TABS: 5.0000 mg | ORAL_TABLET | ORAL | Status: DC | PRN

## 2016-06-02 NOTE — H&P (Addendum)
Margurite Duffy is an 60 y.o. female.   Chief Complaint: Chest pain HPI: The patient with past medical history of hypertension and metabolic syndrome presents emergency department complaining of chest pain. The patient states that began at rest that she was laying in bed. She became hot and mildly nauseous but did not vomit. The pain radiates to the patient's back. She denies any associated shortness of breath. In the emergency department she continued to have chest pain for approximately 5 hours by the time of admission. Laboratory evaluation was unremarkable. The patient is allergic to aspirin so she did not receive antiplatelet therapy. No EKG changes, however due to the patient's respiratory emergency [facility] staff called for admission.  Past Medical History:  Diagnosis Date  . Diabetes mellitus without complication ([place])   . Hypertension   . Thyroid disease     Past Surgical History:  Procedure Laterality Date  . ABDOMINAL HYSTERECTOMY    . BREAST BIOPSY Left    neg  . BREAST BIOPSY Left    core bx- neg    Family History  Problem Relation Age of Onset  . CAD Sister   . Breast cancer Neg Hx    Social History:  reports that she has never smoked. She does not have any smokeless tobacco history on file. Her alcohol and drug histories are not on file.  Allergies:  Allergies  Allergen Reactions  . Aspirin     Prior to Admission medications   Medication Sig Start Date End Date Taking? Authorizing Provider  cyclobenzaprine (FLEXERIL) 10 MG tablet Take 10 mg by mouth 2 (two) times daily.   Yes Historical Provider, MD  Levothyroxine Sodium (TIROSINT) 88 MCG CAPS Take 88 mcg by mouth daily before breakfast.   Yes Historical Provider, MD  lisinopril (PRINIVIL,ZESTRIL) 5 MG tablet Take 5 mg by mouth daily.   Yes Historical Provider, MD  omeprazole (PRILOSEC) 20 MG capsule Take 20 mg by mouth daily.   Yes Historical Provider, MD  oxyCODONE (OXY IR/ROXICODONE) 5 MG immediate release tablet  Take 5 mg by mouth 2 (two) times daily as needed for severe pain.   Yes Historical Provider, MD  primidone (MYSOLINE) 50 MG tablet Take 50 mg by mouth 2 (two) times daily.   Yes Historical Provider, MD  simvastatin (ZOCOR) 10 MG tablet Take 10 mg by mouth every evening.   Yes Historical Provider, MD     Results for orders placed or performed during the hospital encounter of 06/02/16 (from the past 48 hour(s))  Comprehensive metabolic panel     Status: Abnormal   Collection Time: 06/02/16  2:51 AM  Result Value Ref Range   Sodium 139 135 - 145 mmol/L   Potassium 3.3 (L) 3.5 - 5.1 mmol/L   Chloride 105 101 - 111 mmol/L   CO2 24 22 - 32 mmol/L   Glucose, Bld 106 (H) 65 - 99 mg/dL   BUN 19 6 - 20 mg/dL   Creatinine, Ser 1.25 (H) 0.44 - 1.00 mg/dL   Calcium 9.2 8.9 - 10.3 mg/dL   Total Protein 7.0 6.5 - 8.1 g/dL   Albumin 3.8 3.5 - 5.0 g/dL   AST 20 15 - 41 U/L   ALT 17 14 - 54 U/L   Alkaline Phosphatase 68 38 - 126 U/L   Total Bilirubin 0.4 0.3 - 1.2 mg/dL   GFR calc non Af Amer 46 (L) >60 mL/min   GFR calc Af Amer 53 (L) >60 mL/min    Comment: (NOTE)  The eGFR has been calculated using the CKD EPI equation. This calculation has not been validated in all clinical situations. eGFR's persistently <60 mL/min signify possible Chronic Kidney Disease.    Anion gap 10 5 - 15  Lipase, blood     Status: None   Collection Time: 06/02/16  2:51 AM  Result Value Ref Range   Lipase 25 11 - 51 U/L  CBC with Differential     Status: None   Collection Time: 06/02/16  2:51 AM  Result Value Ref Range   WBC 6.2 3.6 - 11.0 K/uL   RBC 4.57 3.80 - 5.20 MIL/uL   Hemoglobin 13.3 12.0 - 16.0 g/dL   HCT 39.0 35.0 - 47.0 %   MCV 85.3 80.0 - 100.0 fL   MCH 29.2 26.0 - 34.0 pg   MCHC 34.2 32.0 - 36.0 g/dL   RDW 14.2 11.5 - 14.5 %   Platelets 338 150 - 440 K/uL   Neutrophils Relative % 47 %   Neutro Abs 3.0 1.4 - 6.5 K/uL   Lymphocytes Relative 40 %   Lymphs Abs 2.5 1.0 - 3.6 K/uL   Monocytes  Relative 10 %   Monocytes Absolute 0.7 0.2 - 0.9 K/uL   Eosinophils Relative 2 %   Eosinophils Absolute 0.1 0 - 0.7 K/uL   Basophils Relative 1 %   Basophils Absolute 0.1 0 - 0.1 K/uL  Troponin I     Status: None   Collection Time: 06/02/16  2:51 AM  Result Value Ref Range   Troponin I <0.03 <0.03 ng/mL   Dg Chest Portable 1 View  Result Date: 06/02/2016 CLINICAL DATA:  Chest pain.  Onset tonight. EXAM: PORTABLE CHEST 1 VIEW COMPARISON:  11/11/2008 FINDINGS: A single AP portable view of the chest demonstrates no focal airspace consolidation or alveolar edema. The lungs are grossly clear except for unchanged linear scarring in the bases. There is no large effusion or pneumothorax. Cardiac and mediastinal contours appear unremarkable. IMPRESSION: No active disease. Electronically Signed   By: Andreas Newport M.D.   On: 06/02/2016 04:03    Review of Systems  Constitutional: Positive for diaphoresis. Negative for chills and fever.  HENT: Negative for sore throat and tinnitus.   Eyes: Negative for blurred vision and redness.  Respiratory: Negative for cough and shortness of breath.   Cardiovascular: Positive for chest pain. Negative for palpitations, orthopnea and PND.  Gastrointestinal: Negative for abdominal pain, diarrhea, nausea and vomiting.  Genitourinary: Negative for dysuria, frequency and urgency.  Musculoskeletal: Negative for joint pain and myalgias.  Skin: Negative for rash.       No lesions  Neurological: Negative for speech change, focal weakness and weakness.  Endo/Heme/Allergies: Does not bruise/bleed easily.       No temperature intolerance  Psychiatric/Behavioral: Negative for depression and suicidal ideas.    Blood pressure 133/76, pulse 73, temperature 97.5 F (36.4 C), temperature source Oral, resp. rate (!) 0, height 5' 7"  (1.702 m), weight 90.7 kg (200 lb), SpO2 98 %. Physical Exam  Vitals reviewed. Constitutional: She is oriented to person, place, and time.  She appears well-developed and well-nourished. No distress.  HENT:  Head: Normocephalic and atraumatic.  Mouth/Throat: Oropharynx is clear and moist.  Eyes: Conjunctivae and EOM are normal. Pupils are equal, round, and reactive to light. No scleral icterus.  Neck: Normal range of motion. Neck supple. No JVD present. No tracheal deviation present. No thyromegaly present.  Cardiovascular: Normal rate, regular rhythm and normal heart sounds.  Exam reveals no gallop and no friction rub.   No murmur heard. Respiratory: Effort normal and breath sounds normal. No respiratory distress.  GI: Soft. Bowel sounds are normal. She exhibits no distension. There is no tenderness.  Genitourinary:  Genitourinary Comments: Deferred  Musculoskeletal: Normal range of motion. She exhibits no edema.  Lymphadenopathy:    She has no cervical adenopathy.  Neurological: She is alert and oriented to person, place, and time. No cranial nerve deficit. She exhibits normal muscle tone.  Skin: Skin is warm and dry. No rash noted. No erythema.  Psychiatric: She has a normal mood and affect. Her behavior is normal. Judgment and thought content normal.     Assessment/Plan This is a 60 year old female admitted for chest pain. 1. Chest pain: Atypical due to duration. Follow cardiac biomarkers. Monitor telemetry. Nitroglycerin as needed for chest pain. I have made the patient nothing by mouth in anticipation of stress test. Cardiology has been consulted. 2. Hypertension: Controlled. The patient had been on lisinopril which I have held due to mild acute kidney injury area and continue to monitor. Labetalol as needed. 3. Acute kidney injury: Mild. Hydrate with intravenous saline. Avoid nephrotoxic agents. 4. Hypothyroidism: Check TSH. Continue Synthroid 5. Hyperlipidemia: Continue statin therapy 6. DVT prophylaxis: Lovenox 7. GI prophylaxis: Pantoprazole per home regimen The patient is a full code. Time spent on admission  orders and patient care approximately 45 minutes  Harrie Foreman, MD 06/02/2016, 5:47 AM

## 2016-06-02 NOTE — ED Triage Notes (Signed)
Pt arrived via ems from home with complaints of chest pain. Pt states pain is a 9 on a 0-10 scale. Pt has 1 NTG spray en route. Upon assessment pt is very sleepy.

## 2016-06-02 NOTE — Progress Notes (Signed)
Nutrition Brief Note  Patient identified on the Malnutrition Screening Tool (MST) Report  Wt Readings from Last 15 Encounters:  06/02/16 197 lb 9.6 oz (89.6 kg)  04/08/16 204 lb (92.5 kg)    Body mass index is 30.95 kg/m. Patient meets criteria for obese class I based on current BMI.   Current diet order is NPO, patient is consuming approximately no meals at this time. Labs and medications reviewed.   Spoke with pt, she endorses a 20# wt loss over 2 months, stating she was trying to lose weight because she found out she was pre-diabetic. Pt denies any problems with appetite or PO intake, states last meal was yesterday evening around 6pm. Wt hx above indicates a 7#/3.4% insignificant wt loss over 2 months. Will monitor  PO intake when pt is advanced  No nutrition interventions warranted at this time. If nutrition issues arise, please consult RD.   Dionne AnoWilliam M. Anaiyah Anglemyer, MS, RD LDN Inpatient Clinical Dietitian Pager [phone]

## 2016-06-02 NOTE — Progress Notes (Signed)
Patient is alert and oriented x 4, admitted to room 234 with the diagnosis of chest pain. C/o pain of 5/10 on a pain scale but refused to take any pain medication. Tele box called to CCMD by Roderick PeeLisa R. RN and Marchelle FolksAmanda NT. Password was set up.  Skin assessment done with Misty StanleyLisa RN, skin dry and intact. Husband at bedside, voiced no complaint. Will continue to monitor.

## 2016-06-02 NOTE — Care Management (Signed)
Patient placed in observation for Chest pain.  Negative troponins.  Cardiology consult is pending

## 2016-06-02 NOTE — Progress Notes (Signed)
[facility] [place] at [facility]   PATIENT NAME: Rebekah Salinas    MRN#:  [id number]  DATE OF BIRTH:  Jul 25, 1956  SUBJECTIVE:  Hospital Day: 0 days Rebekah Salinas is a 61 y.o. female presenting with Chest Pain .   Overnight events: No acute overnight events Interval Events: Still complains of chest pain 4/10 intensity  REVIEW OF SYSTEMS:  CONSTITUTIONAL: No fever, fatigue or weakness.  EYES: No blurred or double vision.  EARS, NOSE, AND THROAT: No tinnitus or ear pain.  RESPIRATORY: No cough, shortness of breath, wheezing or hemoptysis.  CARDIOVASCULAR: Positive chest pain, denies orthopnea, edema.  GASTROINTESTINAL: No nausea, vomiting, diarrhea or abdominal pain.  GENITOURINARY: No dysuria, hematuria.  ENDOCRINE: No polyuria, nocturia,  HEMATOLOGY: No anemia, easy bruising or bleeding SKIN: No rash or lesion. MUSCULOSKELETAL: No joint pain or arthritis.   NEUROLOGIC: No tingling, numbness, weakness.  PSYCHIATRY: No anxiety or depression.   DRUG ALLERGIES:   Allergies  Allergen Reactions  . Aspirin     VITALS:  Blood pressure 123/65, pulse 69, temperature 97.6 F (36.4 C), temperature source Oral, resp. rate 17, height 5\' 7"  (1.702 m), weight 89.6 kg (197 lb 9.6 oz), SpO2 98 %.  PHYSICAL EXAMINATION:  VITAL SIGNS: Vitals:   06/02/16 1110 06/02/16 1142  BP: 127/66 123/65  Pulse: 73 69  Resp:  17  Temp: 97.3 F (36.3 C) 97.6 F (36.4 C)   GENERAL:60 y.o.female currently in no acute distress.  HEAD: Normocephalic, atraumatic.  EYES: Pupils equal, round, reactive to light. Extraocular muscles intact. No scleral icterus.  MOUTH: Moist mucosal membrane. Dentition intact. No abscess noted.  EAR, NOSE, THROAT: Clear without exudates. No external lesions.  NECK: Supple. No thyromegaly. No nodules. No JVD.  PULMONARY: Clear to ascultation, without wheeze rails or rhonci. No use of accessory muscles, Good respiratory effort. good air entry  bilaterally CHEST: Nontender to palpation.  CARDIOVASCULAR: S1 and S2. Regular rate and rhythm. No murmurs, rubs, or gallops. No edema. Pedal pulses 2+ bilaterally.  GASTROINTESTINAL: Soft, nontender, nondistended. No masses. Positive bowel sounds. No hepatosplenomegaly.  MUSCULOSKELETAL: No swelling, clubbing, or edema. Range of motion full in all extremities.  NEUROLOGIC: Cranial nerves II through XII are intact. No gross focal neurological deficits. Sensation intact. Reflexes intact.  SKIN: No ulceration, lesions, rashes, or cyanosis. Skin warm and dry. Turgor intact.  PSYCHIATRIC: Mood, affect within normal limits. The patient is awake, alert and oriented x 3. Insight, judgment intact.      LABORATORY PANEL:   CBC  Recent Labs Lab 06/02/16 0251  WBC 6.2  HGB 13.3  HCT 39.0  PLT 338   ------------------------------------------------------------------------------------------------------------------  Chemistries   Recent Labs Lab 06/02/16 0251  NA 139  K 3.3*  CL 105  CO2 24  GLUCOSE 106*  BUN 19  CREATININE 1.25*  CALCIUM 9.2  AST 20  ALT 17  ALKPHOS 68  BILITOT 0.4   ------------------------------------------------------------------------------------------------------------------  Cardiac Enzymes  Recent Labs Lab 06/02/16 1232  TROPONINI <0.03   ------------------------------------------------------------------------------------------------------------------  RADIOLOGY:  Dg Chest Portable 1 View  Result Date: 06/02/2016 CLINICAL DATA:  Chest pain.  Onset tonight. EXAM: PORTABLE CHEST 1 VIEW COMPARISON:  11/11/2008 FINDINGS: A single AP portable view of the chest demonstrates no focal airspace consolidation or alveolar edema. The lungs are grossly clear except for unchanged linear scarring in the bases. There is no large effusion or pneumothorax. Cardiac and mediastinal contours appear unremarkable. IMPRESSION: No active disease. Electronically Signed   By:  Ellery Plunkaniel R Mitchell M.D.   On: 06/02/2016 04:03    EKG:   Orders placed or performed during the hospital encounter of 06/02/16  . ED EKG  . ED EKG  . EKG 12-Lead  . EKG 12-Lead    ASSESSMENT AND PLAN:   Rebekah Salinas is a 60 y.o. female presenting with Chest Pain . Admitted 06/02/2016 : Day #: 0 days 1. Chest pain: Cardiac enzymes negative thus far, patient evaluated by cardiology plans for stress test in the morning 2. Essential hypertension: 3. Acute kidney injury: Stop IV fluids, resolved 4. Hyperlipidemia unspecified: Statin therapy  All the records are reviewed and case discussed with Care Management/Social Workerr. Management plans discussed with the patient, family and they are in agreement.  CODE STATUS: full TOTAL TIME TAKING CARE OF THIS PATIENT: 28 minutes.   POSSIBLE D/C IN 1DAYS, DEPENDING ON CLINICAL CONDITION.   [name],  Mardi MainlandDavid K M.D on 06/02/2016 at 1:24 PM  Between 7am to 6pm - Pager - [phone]  After 6pm: House Pager: - [phone]  [facility]  Office  [phone]  CC: Primary care physician; Imelda PillowHOLLAND, CHELSA, NP

## 2016-06-02 NOTE — ED Provider Notes (Signed)
[facility] Emergency Department Provider Note  ____________________________________________  Time seen: Approximately 4:27 AM  I have reviewed the triage vital signs and the nursing notes.   HISTORY  Chief Complaint Chest Pain    HPI Rebekah Salinas is a 60 y.o. female who complains of severe chest pressure that radiates to her back associated with shortness of breath nausea and diaphoresis that started tonight. No aggravating or alleviating factors. Never had anything like this before. Was given nitroglycerin spray by EMS without relief. She does complain of a headache though. Been compliant with her medications. Pain is constant since onset.     Past Medical History:  Diagnosis Date  . Diabetes mellitus without complication ([facility])   . Hypertension   . Thyroid disease      There are no active problems to display for this patient.    Past Surgical History:  Procedure Laterality Date  . ABDOMINAL HYSTERECTOMY    . BREAST BIOPSY Left    neg  . BREAST BIOPSY Left    core bx- neg     Prior to Admission medications   Medication Sig Start Date End Date Taking? Authorizing Provider  baclofen (LIORESAL) 10 MG tablet Take 1 tablet (10 mg total) by mouth 3 (three) times daily. 04/08/16   Evangeline Dakinharles M Beers, PA-C     Allergies Aspirin   Family History  Problem Relation Age of Onset  . Breast cancer Neg Hx     Social History Social History  Substance Use Topics  . Smoking status: Never Smoker  . Smokeless tobacco: Not on file  . Alcohol use Not on file    Review of Systems  Constitutional:   No fever or chills.  ENT:   No sore throat. No rhinorrhea. Cardiovascular:   Positive as above chest pain. Respiratory:   No dyspnea or cough. Gastrointestinal:   Negative for abdominal pain, vomiting and diarrhea.   10-point ROS otherwise negative.  ____________________________________________   PHYSICAL EXAM:  VITAL SIGNS: ED Triage Vitals   Enc Vitals Group     BP 06/02/16 0257 140/82     Pulse Rate 06/02/16 0257 86     Resp 06/02/16 0257 (!) 23     Temp 06/02/16 0257 97.5 F (36.4 C)     Temp Source 06/02/16 0257 Oral     SpO2 06/02/16 0257 99 %     Weight 06/02/16 0258 200 lb (90.7 kg)     Height 06/02/16 0258 5\' 7"  (1.702 m)     Head Circumference --      Peak Flow --      Pain Score 06/02/16 0259 9     Pain Loc --      Pain Edu? --      Excl. in GC? --     Vital signs reviewed, nursing assessments reviewed.   Constitutional:   Alert and oriented. Not in distress. Eyes:   No scleral icterus. No conjunctival pallor. PERRL. EOMI.  No nystagmus. ENT   Head:   Normocephalic and atraumatic.   Nose:   No congestion/rhinnorhea. No septal hematoma   Mouth/Throat:   MMM, no pharyngeal erythema. No peritonsillar mass.    Neck:   No stridor. No SubQ emphysema. No meningismus. Hematological/Lymphatic/Immunilogical:   No cervical lymphadenopathy. Cardiovascular:   RRR. Symmetric bilateral radial and DP pulses.  No murmurs.  Respiratory:   Normal respiratory effort without tachypnea nor retractions. Breath sounds are clear and equal bilaterally. No wheezes/rales/rhonchi. Chest wall nontender Gastrointestinal:  Soft and nontender. Non distended. There is no CVA tenderness.  No rebound, rigidity, or guarding. Genitourinary:   deferred Musculoskeletal:   Nontender with normal range of motion in all extremities. No joint effusions.  No lower extremity tenderness.  No edema. Neurologic:   Normal speech and language.  CN 2-10 normal. Motor grossly intact. No gross focal neurologic deficits are appreciated.  Skin:    Skin is warm, moist. No rash noted.  No petechiae, purpura, or bullae.  ____________________________________________    LABS (pertinent positives/negatives) (all labs ordered are listed, but only abnormal results are displayed) Labs Reviewed  COMPREHENSIVE METABOLIC PANEL - Abnormal; Notable for  the following:       Result Value   Potassium 3.3 (*)    Glucose, Bld 106 (*)    Creatinine, Ser 1.25 (*)    GFR calc non Af Amer 46 (*)    GFR calc Af Amer 53 (*)    All other components within normal limits  LIPASE, BLOOD  CBC WITH DIFFERENTIAL/PLATELET  TROPONIN I   ____________________________________________   EKG  Interpreted by me Sinus rhythm rate of 91, left axis, normal intervals. Normal QRS ST segments. Isolated T-wave inversion in lead 3 which is nonspecific.  ____________________________________________    RADIOLOGY  Chest x-ray unremarkable  ____________________________________________   PROCEDURES Procedures  ____________________________________________   INITIAL IMPRESSION / ASSESSMENT AND PLAN / ED COURSE  Pertinent labs & imaging results that were available during my care of the patient were reviewed by me and considered in my medical decision making (see chart for details).  Patient presents with precordial chest pain, has risk factors including hypertension diabetes. Troponin negative. EKG nonischemic. Discussed with hospitalist for further evaluation. Patients allergic to aspirin. We'll order Plavix.     Clinical Course   ____________________________________________   FINAL CLINICAL IMPRESSION(S) / ED DIAGNOSES  Final diagnoses:  Precordial pain       Portions of this note were generated with dragon dictation software. Dictation errors may occur despite best attempts at proofreading.    Sharman Cheek, MD 06/02/16 [id number]

## 2016-06-03 ENCOUNTER — Ambulatory Visit: Payer: Medicaid Other

## 2016-06-03 ENCOUNTER — Encounter: Payer: Self-pay | Admitting: Radiology

## 2016-06-03 MED ORDER — REGADENOSON 0.4 MG/5ML IV SOLN
0.4000 mg | Freq: Once | INTRAVENOUS | Status: AC
  Administered 2016-06-03: 0.4 mg via INTRAVENOUS

## 2016-06-03 MED ORDER — TECHNETIUM TC 99M TETROFOSMIN IV KIT
30.3800 | PACK | Freq: Once | INTRAVENOUS | Status: AC | PRN
  Administered 2016-06-03: 30.38 via INTRAVENOUS

## 2016-06-03 MED ORDER — TECHNETIUM TC 99M TETROFOSMIN IV KIT
13.6770 | PACK | Freq: Once | INTRAVENOUS | Status: AC | PRN
  Administered 2016-06-03: 13.677 via INTRAVENOUS

## 2016-06-03 NOTE — Care Management (Signed)
Stress test for today.  If negative will discharge.  No needs

## 2016-06-03 NOTE — Progress Notes (Signed)
Pt returns from stress test, VSS, 3/10 chest pressure but refusing intervention. I will administer a.m. Medication and continue to assess.

## 2016-06-03 NOTE — Progress Notes (Signed)
[facility] - [place] at [facility]   PATIENT NAME: Rebekah Salinas    MR#:  [id number]  DATE OF BIRTH:  [birth year]  SUBJECTIVE:   no chest pain overnight  REVIEW OF SYSTEMS:    Review of Systems  Constitutional: Negative.  Negative for chills, fever and malaise/fatigue.  HENT: Negative.  Negative for ear discharge, ear pain, hearing loss, nosebleeds and sore throat.   Eyes: Negative.  Negative for blurred vision and pain.  Respiratory: Negative.  Negative for cough, hemoptysis, shortness of breath and wheezing.   Cardiovascular: Negative.  Negative for chest pain, palpitations and leg swelling.  Gastrointestinal: Negative.  Negative for abdominal pain, blood in stool, diarrhea, nausea and vomiting.  Genitourinary: Negative.  Negative for dysuria.  Musculoskeletal: Negative.  Negative for back pain.  Skin: Negative.   Neurological: Negative for dizziness, tremors, speech change, focal weakness, seizures and headaches.  Endo/Heme/Allergies: Negative.  Does not bruise/bleed easily.  Psychiatric/Behavioral: Negative.  Negative for depression, hallucinations and suicidal ideas.    Tolerating Diet:yes      DRUG ALLERGIES:   Allergies  Allergen Reactions  . Aspirin     VITALS:  Blood pressure 135/73, pulse 77, temperature 98.4 F (36.9 C), temperature source Oral, resp. rate 16, height 5\' 7"  (1.702 m), weight 88.7 kg (195 lb 8 oz), SpO2 97 %.  PHYSICAL EXAMINATION:   Physical Exam  Constitutional: She is oriented to person, place, and time and well-developed, well-nourished, and in no distress. No distress.  HENT:  Head: Normocephalic.  Eyes: No scleral icterus.  Neck: Normal range of motion. Neck supple. No JVD present. No tracheal deviation present.  Cardiovascular: Normal rate, regular rhythm and normal heart sounds.  Exam reveals no gallop and no friction rub.   No murmur heard. Pulmonary/Chest: Effort normal and breath sounds normal. No respiratory  distress. She has no wheezes. She has no rales. She exhibits no tenderness.  Abdominal: Soft. Bowel sounds are normal. She exhibits no distension and no mass. There is no tenderness. There is no rebound and no guarding.  Musculoskeletal: Normal range of motion. She exhibits no edema.  Neurological: She is alert and oriented to person, place, and time.  Skin: Skin is warm. No rash noted. No erythema.  Psychiatric: Affect and judgment normal.      LABORATORY PANEL:   CBC  Recent Labs Lab 06/02/16 0251  WBC 6.2  HGB 13.3  HCT 39.0  PLT 338   ------------------------------------------------------------------------------------------------------------------  Chemistries   Recent Labs Lab 06/02/16 0251  NA 139  K 3.3*  CL 105  CO2 24  GLUCOSE 106*  BUN 19  CREATININE 1.25*  CALCIUM 9.2  AST 20  ALT 17  ALKPHOS 68  BILITOT 0.4   ------------------------------------------------------------------------------------------------------------------  Cardiac Enzymes  Recent Labs Lab 06/02/16 0623 06/02/16 1232 06/02/16 1800  TROPONINI <0.03 <0.03 <0.03   ------------------------------------------------------------------------------------------------------------------  RADIOLOGY:  Dg Chest Portable 1 View  Result Date: 06/02/2016 CLINICAL DATA:  Chest pain.  Onset tonight. EXAM: PORTABLE CHEST 1 VIEW COMPARISON:  11/11/2008 FINDINGS: A single AP portable view of the chest demonstrates no focal airspace consolidation or alveolar edema. The lungs are grossly clear except for unchanged linear scarring in the bases. There is no large effusion or pneumothorax. Cardiac and mediastinal contours appear unremarkable. IMPRESSION: No active disease. Electronically Signed   By: Ellery Plunk M.D.   On: 06/02/2016 04:03     ASSESSMENT AND PLAN:   60 y.o. female with HTN presented with chest Pain  1. Chest pain, atypical: Cardiac enzymes negative  Follow up on stress.  2.  Essential hypertension: Continue Lisinopril  3. Acute kidney injury: Resolved  4. Hyperlipidemia unspecified: Continue ZOCOR  5. Hypothyroid: continue Synthroid        Management plans discussed with the patient and she is in agreement.  CODE STATUS: full  TOTAL TIME TAKING CARE OF THIS PATIENT: 30 minutes.     POSSIBLE D/C today, DEPENDING ON CLINICAL CONDITION.   Ardyn Forge M.D on 06/03/2016 at 12:46 PM  Between 7am to 6pm - Pager - [phone] After 6pm go to [email or web address] - password [email or web address]  [facility] [place] [place] [facility]  Office  [phone]  CC: Primary care physician; Imelda PillowHOLLAND, CHELSA, NP  Note: This dictation was prepared with Dragon dictation along with smaller phrase technology. Any transcriptional errors that result from this process are unintentional.

## 2016-06-03 NOTE — Discharge Summary (Signed)
[facility] - [place] at [facility]   PATIENT NAME: Rebekah Salinas    MR#:  [id number]  DATE OF BIRTH:  1956/09/02  DATE OF ADMISSION:  06/02/2016 ADMITTING PHYSICIAN: Arnaldo Natal, MD  DATE OF DISCHARGE: 06/03/2016    PRIMARY CARE PHYSICIAN: Imelda Pillow, NP    ADMISSION DIAGNOSIS:  Precordial pain [R07.2]  DISCHARGE DIAGNOSIS:  Active Problems:   Chest pain   SECONDARY DIAGNOSIS:   Past Medical History:  Diagnosis Date  . Diabetes mellitus without complication (HCC)   . Hypertension   . Thyroid disease     HOSPITAL COURSE:   60 y.o. female with HTN presented with chest Pain  1. Chest pain, atypical: She was admitted to evalute for ACS. She was ruled out for ACS withengative cardiac enzymes. She underwent stress test which was negative for ischemia.  2. Essential hypertension: Continue Lisinopril  3. Acute kidney injury: Resolved  4. Hyperlipidemia unspecified: Continue ZOCOR  5. Hypothyroid: continue Synthroid    DISCHARGE CONDITIONS AND DIET:  Stable Cardiac diet  CONSULTS OBTAINED:  Treatment Team:  Adrian Saran, MD  DRUG ALLERGIES:   Allergies  Allergen Reactions  . Aspirin     DISCHARGE MEDICATIONS:   Current Discharge Medication List    CONTINUE these medications which have NOT CHANGED   Details  cyclobenzaprine (FLEXERIL) 10 MG tablet Take 10 mg by mouth 2 (two) times daily.    Levothyroxine Sodium (TIROSINT) 88 MCG CAPS Take 88 mcg by mouth daily before breakfast.    lisinopril (PRINIVIL,ZESTRIL) 5 MG tablet Take 5 mg by mouth daily.    omeprazole (PRILOSEC) 20 MG capsule Take 20 mg by mouth daily.    oxyCODONE (OXY IR/ROXICODONE) 5 MG immediate release tablet Take 5 mg by mouth 2 (two) times daily as needed for severe pain.    primidone (MYSOLINE) 50 MG tablet Take 50 mg by mouth 2 (two) times daily.    simvastatin (ZOCOR) 10 MG tablet Take 10 mg by mouth every evening.      STOP taking these medications      baclofen (LIORESAL) 10 MG tablet               Today   CHIEF COMPLAINT:  Doing ok no chest pain or SOB or back pain   VITAL SIGNS:  Blood pressure 135/73, pulse 77, temperature 98.4 F (36.9 C), temperature source Oral, resp. rate 16, height 5\' 7"  (1.702 m), weight 88.7 kg (195 lb 8 oz), SpO2 97 %.   REVIEW OF SYSTEMS:  Review of Systems  Constitutional: Negative.  Negative for chills, fever and malaise/fatigue.  HENT: Negative.  Negative for ear discharge, ear pain, hearing loss, nosebleeds and sore throat.   Eyes: Negative.  Negative for blurred vision and pain.  Respiratory: Negative.  Negative for cough, hemoptysis, shortness of breath and wheezing.   Cardiovascular: Negative.  Negative for chest pain, palpitations and leg swelling.  Gastrointestinal: Negative.  Negative for abdominal pain, blood in stool, diarrhea, nausea and vomiting.  Genitourinary: Negative.  Negative for dysuria.  Musculoskeletal: Negative.  Negative for back pain.  Skin: Negative.   Neurological: Negative for dizziness, tremors, speech change, focal weakness, seizures and headaches.  Endo/Heme/Allergies: Negative.  Does not bruise/bleed easily.  Psychiatric/Behavioral: Negative.  Negative for depression, hallucinations and suicidal ideas.     PHYSICAL EXAMINATION:  GENERAL:  60 y.o.-year-old patient lying in the bed with no acute distress.  NECK:  Supple, no jugular venous distention. No thyroid enlargement, no tenderness.  LUNGS: Normal breath sounds bilaterally, no wheezing, rales,rhonchi  No use of accessory muscles of respiration.  CARDIOVASCULAR: S1, S2 normal. No murmurs, rubs, or gallops.  ABDOMEN: Soft, non-tender, non-distended. Bowel sounds present. No organomegaly or mass.  EXTREMITIES: No pedal edema, cyanosis, or clubbing.  PSYCHIATRIC: The patient is alert and oriented x 3.  SKIN: No obvious rash, lesion, or ulcer.   DATA REVIEW:   CBC  Recent Labs Lab  06/02/16 0251  WBC 6.2  HGB 13.3  HCT 39.0  PLT 338    Chemistries   Recent Labs Lab 06/02/16 0251  NA 139  K 3.3*  CL 105  CO2 24  GLUCOSE 106*  BUN 19  CREATININE 1.25*  CALCIUM 9.2  AST 20  ALT 17  ALKPHOS 68  BILITOT 0.4    Cardiac Enzymes  Recent Labs Lab 06/02/16 0623 06/02/16 1232 06/02/16 1800  TROPONINI <0.03 <0.03 <0.03    Microbiology Results  @[email or web address] @  RADIOLOGY:  Dg Chest Portable 1 View  Result Date: 06/02/2016 CLINICAL DATA:  Chest pain.  Onset tonight. EXAM: PORTABLE CHEST 1 VIEW COMPARISON:  11/11/2008 FINDINGS: A single AP portable view of the chest demonstrates no focal airspace consolidation or alveolar edema. The lungs are grossly clear except for unchanged linear scarring in the bases. There is no large effusion or pneumothorax. Cardiac and mediastinal contours appear unremarkable. IMPRESSION: No active disease. Electronically Signed   By: Ellery Plunkaniel R Mitchell M.D.   On: 06/02/2016 04:03      Management plans discussed with the patient and she is in agreement. Stable for discharge home  Patient should follow up with pcp  CODE STATUS:     Code Status Orders        Start     Ordered   06/02/16 0559  Full code  Continuous     06/02/16 0558    Code Status History    Date Active Date Inactive Code Status Order ID Comments User Context   This patient has a current code status but no historical code status.      TOTAL TIME TAKING CARE OF THIS PATIENT: 36 minutes.    Note: This dictation was prepared with Dragon dictation along with smaller phrase technology. Any transcriptional errors that result from this process are unintentional.  Emeric Novinger M.D on 06/03/2016 at 12:49 PM  Between 7am to 6pm - Pager - [phone] After 6pm go to [email or web address] - [employer] research officer, governmentpassword [email or web address]  [facility] [place] [facility]  Office  [phone]  CC: Primary care physician; Imelda PillowHOLLAND, CHELSA, NP

## 2016-06-04 LAB — NM MYOCAR MULTI W/SPECT W/WALL MOTION / EF
CHL CUP MPHR: 161 {beats}/min
CHL CUP NUCLEAR SDS: 2
CHL CUP NUCLEAR SRS: 2
Estimated workload: 1 METS
Exercise duration (min): 1 min
Exercise duration (sec): 0 s
LVDIAVOL: 50 mL (ref 46–106)
LVSYSVOL: 15 mL
Peak HR: 113 {beats}/min
Percent HR: 70 %
Rest HR: 75 {beats}/min
SSS: 2
TID: 1

## 2016-10-31 ENCOUNTER — Other Ambulatory Visit: Payer: Self-pay | Admitting: Nurse Practitioner

## 2016-10-31 DIAGNOSIS — Z1231 Encounter for screening mammogram for malignant neoplasm of breast: Secondary | ICD-10-CM

## 2016-12-05 ENCOUNTER — Other Ambulatory Visit: Payer: Self-pay | Admitting: Nurse Practitioner

## 2016-12-05 DIAGNOSIS — Z1231 Encounter for screening mammogram for malignant neoplasm of breast: Secondary | ICD-10-CM

## 2017-01-09 ENCOUNTER — Ambulatory Visit
Admission: RE | Admit: 2017-01-09 | Discharge: 2017-01-09 | Disposition: A | Discharge status: Home / Self Care | Payer: Medicaid Other | Source: Ambulatory Visit | Attending: Nurse Practitioner | Admitting: Nurse Practitioner

## 2017-01-09 ENCOUNTER — Encounter: Payer: Self-pay | Admitting: Radiology

## 2017-01-09 DIAGNOSIS — Z1231 Encounter for screening mammogram for malignant neoplasm of breast: Secondary | ICD-10-CM | POA: Insufficient documentation

## 2017-09-17 ENCOUNTER — Other Ambulatory Visit: Payer: Self-pay | Admitting: Anesthesiology

## 2017-09-17 DIAGNOSIS — M5382 Other specified dorsopathies, cervical region: Secondary | ICD-10-CM

## 2017-09-24 ENCOUNTER — Ambulatory Visit
Admission: RE | Admit: 2017-09-24 | Discharge: 2017-09-24 | Disposition: A | Discharge status: Home / Self Care | Payer: Medicaid Other | Source: Ambulatory Visit | Attending: Anesthesiology | Admitting: Anesthesiology

## 2017-09-24 DIAGNOSIS — M50223 Other cervical disc displacement at C6-C7 level: Secondary | ICD-10-CM | POA: Insufficient documentation

## 2017-09-24 DIAGNOSIS — M5031 Other cervical disc degeneration,  high cervical region: Secondary | ICD-10-CM | POA: Diagnosis not present

## 2017-09-24 DIAGNOSIS — M5382 Other specified dorsopathies, cervical region: Secondary | ICD-10-CM

## 2017-09-24 DIAGNOSIS — M47813 Spondylosis without myelopathy or radiculopathy, cervicothoracic region: Secondary | ICD-10-CM | POA: Insufficient documentation

## 2017-09-24 DIAGNOSIS — M4802 Spinal stenosis, cervical region: Secondary | ICD-10-CM | POA: Diagnosis not present

## 2017-09-24 DIAGNOSIS — M542 Cervicalgia: Secondary | ICD-10-CM | POA: Diagnosis present

## 2017-11-12 ENCOUNTER — Other Ambulatory Visit: Payer: Self-pay | Admitting: Nurse Practitioner

## 2017-11-12 DIAGNOSIS — Z1231 Encounter for screening mammogram for malignant neoplasm of breast: Secondary | ICD-10-CM

## 2018-01-12 ENCOUNTER — Ambulatory Visit
Admission: RE | Admit: 2018-01-12 | Discharge: 2018-01-12 | Disposition: A | Discharge status: Home / Self Care | Payer: Medicaid Other | Source: Ambulatory Visit | Attending: Nurse Practitioner | Admitting: Nurse Practitioner

## 2018-01-12 DIAGNOSIS — Z1231 Encounter for screening mammogram for malignant neoplasm of breast: Secondary | ICD-10-CM | POA: Insufficient documentation

## 2018-01-13 ENCOUNTER — Other Ambulatory Visit: Payer: Self-pay | Admitting: Nurse Practitioner

## 2018-01-13 DIAGNOSIS — N6489 Other specified disorders of breast: Secondary | ICD-10-CM

## 2018-01-13 DIAGNOSIS — R928 Other abnormal and inconclusive findings on diagnostic imaging of breast: Secondary | ICD-10-CM

## 2018-01-25 ENCOUNTER — Ambulatory Visit
Admission: RE | Admit: 2018-01-25 | Discharge: 2018-01-25 | Disposition: A | Discharge status: Home / Self Care | Payer: Medicaid Other | Source: Ambulatory Visit | Attending: Nurse Practitioner | Admitting: Nurse Practitioner

## 2018-01-25 DIAGNOSIS — N6489 Other specified disorders of breast: Secondary | ICD-10-CM | POA: Diagnosis present

## 2018-01-25 DIAGNOSIS — R928 Other abnormal and inconclusive findings on diagnostic imaging of breast: Secondary | ICD-10-CM

## 2018-01-26 ENCOUNTER — Other Ambulatory Visit: Payer: Self-pay | Admitting: Nurse Practitioner

## 2018-01-26 DIAGNOSIS — N63 Unspecified lump in unspecified breast: Secondary | ICD-10-CM

## 2018-08-12 ENCOUNTER — Ambulatory Visit
Admission: RE | Admit: 2018-08-12 | Discharge: 2018-08-12 | Disposition: A | Discharge status: Home / Self Care | Payer: Medicaid Other | Source: Ambulatory Visit | Attending: Nurse Practitioner | Admitting: Nurse Practitioner

## 2018-08-12 DIAGNOSIS — N63 Unspecified lump in unspecified breast: Secondary | ICD-10-CM

## 2018-11-25 ENCOUNTER — Ambulatory Visit: Payer: Self-pay

## 2018-12-07 ENCOUNTER — Encounter: Payer: Self-pay | Admitting: Gerontology

## 2018-12-07 ENCOUNTER — Other Ambulatory Visit: Payer: Self-pay

## 2018-12-07 ENCOUNTER — Ambulatory Visit: Payer: Medicaid Other | Admitting: Gerontology

## 2018-12-07 VITALS — BP 138/86 | HR 93 | Ht 65.0 in | Wt 219.1 lb

## 2018-12-07 DIAGNOSIS — G8929 Other chronic pain: Secondary | ICD-10-CM

## 2018-12-07 DIAGNOSIS — Z8719 Personal history of other diseases of the digestive system: Secondary | ICD-10-CM

## 2018-12-07 DIAGNOSIS — Z0000 Encounter for general adult medical examination without abnormal findings: Secondary | ICD-10-CM

## 2018-12-07 DIAGNOSIS — Z8639 Personal history of other endocrine, nutritional and metabolic disease: Secondary | ICD-10-CM

## 2018-12-07 DIAGNOSIS — M25512 Pain in left shoulder: Secondary | ICD-10-CM

## 2018-12-07 DIAGNOSIS — Z7689 Persons encountering health services in other specified circumstances: Secondary | ICD-10-CM

## 2018-12-07 DIAGNOSIS — M545 Low back pain, unspecified: Secondary | ICD-10-CM

## 2018-12-07 DIAGNOSIS — M542 Cervicalgia: Secondary | ICD-10-CM

## 2018-12-07 DIAGNOSIS — G250 Essential tremor: Secondary | ICD-10-CM

## 2018-12-07 DIAGNOSIS — I10 Essential (primary) hypertension: Secondary | ICD-10-CM

## 2018-12-07 DIAGNOSIS — E039 Hypothyroidism, unspecified: Secondary | ICD-10-CM

## 2018-12-07 MED ORDER — LEVOTHYROXINE SODIUM 88 MCG PO CAPS: 88.0000 ug | ORAL_CAPSULE | Freq: Every day | ORAL | 0 refills | Status: DC

## 2018-12-07 MED ORDER — ATORVASTATIN CALCIUM 20 MG PO TABS: 20.0000 mg | ORAL_TABLET | Freq: Every day | ORAL | 0 refills | Status: DC

## 2018-12-07 MED ORDER — PRIMIDONE 50 MG PO TABS: 50.0000 mg | ORAL_TABLET | Freq: Two times a day (BID) | ORAL | 0 refills | Status: DC

## 2018-12-07 MED ORDER — CYCLOBENZAPRINE HCL 10 MG PO TABS: 10.0000 mg | ORAL_TABLET | Freq: Two times a day (BID) | ORAL | 0 refills | Status: DC

## 2018-12-07 MED ORDER — LISINOPRIL 5 MG PO TABS: 5.0000 mg | ORAL_TABLET | Freq: Every day | ORAL | 0 refills | Status: DC

## 2018-12-07 MED ORDER — OMEPRAZOLE 20 MG PO CPDR: 20.0000 mg | DELAYED_RELEASE_CAPSULE | Freq: Every day | ORAL | 0 refills | Status: DC

## 2018-12-07 NOTE — Patient Instructions (Signed)
   DASH Eating Plan DASH stands for "Dietary Approaches to Stop Hypertension." The DASH eating plan is a healthy eating plan that has been shown to reduce high blood pressure (hypertension). It may also reduce your risk for type 2 diabetes, heart disease, and stroke. The DASH eating plan may also help with weight loss. What are tips for following this plan?  General guidelines  Avoid eating more than 2,300 mg (milligrams) of salt (sodium) a day. If you have hypertension, you may need to reduce your sodium intake to 1,500 mg a day.  Limit alcohol intake to no more than 1 drink a day for nonpregnant women and 2 drinks a day for men. One drink equals 12 oz of beer, 5 oz of wine, or 1 oz of hard liquor.  Work with your health care provider to maintain a healthy body weight or to lose weight. Ask what an ideal weight is for you.  Get at least 30 minutes of exercise that causes your heart to beat faster (aerobic exercise) most days of the week. Activities may include walking, swimming, or biking.  Work with your health care provider or diet and nutrition specialist (dietitian) to adjust your eating plan to your individual calorie needs. Reading food labels   Check food labels for the amount of sodium per serving. Choose foods with less than 5 percent of the Daily Value of sodium. Generally, foods with less than 300 mg of sodium per serving fit into this eating plan.  To find whole grains, look for the word "whole" as the first word in the ingredient list. Shopping  Buy products labeled as "low-sodium" or "no salt added."  Buy fresh foods. Avoid canned foods and premade or frozen meals. Cooking  Avoid adding salt when cooking. Use salt-free seasonings or herbs instead of table salt or sea salt. Check with your health care provider or pharmacist before using salt substitutes.  Do not fry foods. Cook foods using healthy methods such as baking, boiling, grilling, and broiling instead.   Cook with heart-healthy oils, such as olive, canola, soybean, or sunflower oil. Meal planning  Eat a balanced diet that includes: ? 5 or more servings of fruits and vegetables each day. At each meal, try to fill half of your plate with fruits and vegetables. ? Up to 6-8 servings of whole grains each day. ? Less than 6 oz of lean meat, poultry, or fish each day. A 3-oz serving of meat is about the same size as a deck of cards. One egg equals 1 oz. ? 2 servings of low-fat dairy each day. ? A serving of nuts, seeds, or beans 5 times each week. ? Heart-healthy fats. Healthy fats called Omega-3 fatty acids are found in foods such as flaxseeds and coldwater fish, like sardines, salmon, and mackerel.  Limit how much you eat of the following: ? Canned or prepackaged foods. ? Food that is high in trans fat, such as fried foods. ? Food that is high in saturated fat, such as fatty meat. ? Sweets, desserts, sugary drinks, and other foods with added sugar. ? Full-fat dairy products.  Do not salt foods before eating.  Try to eat at least 2 vegetarian meals each week.  Eat more home-cooked food and less restaurant, buffet, and fast food.  When eating at a restaurant, ask that your food be prepared with less salt or no salt, if possible. What foods are recommended? The items listed may not be a complete list. Talk with   your dietitian about what dietary choices are best for you. Grains Whole-grain or whole-wheat bread. Whole-grain or whole-wheat pasta. Brown rice. Oatmeal. Quinoa. Bulgur. Whole-grain and low-sodium cereals. Pita bread. Low-fat, low-sodium crackers. Whole-wheat flour tortillas. Vegetables Fresh or frozen vegetables (raw, steamed, roasted, or grilled). Low-sodium or reduced-sodium tomato and vegetable juice. Low-sodium or reduced-sodium tomato sauce and tomato paste. Low-sodium or reduced-sodium canned vegetables. Fruits All fresh, dried, or frozen fruit. Canned fruit in natural juice  (without added sugar). Meat and other protein foods Skinless chicken or turkey. Ground chicken or turkey. Pork with fat trimmed off. Fish and seafood. Egg whites. Dried beans, peas, or lentils. Unsalted nuts, nut butters, and seeds. Unsalted canned beans. Lean cuts of beef with fat trimmed off. Low-sodium, lean deli meat. Dairy Low-fat (1%) or fat-free (skim) milk. Fat-free, low-fat, or reduced-fat cheeses. Nonfat, low-sodium ricotta or cottage cheese. Low-fat or nonfat yogurt. Low-fat, low-sodium cheese. Fats and oils Soft margarine without trans fats. Vegetable oil. Low-fat, reduced-fat, or light mayonnaise and salad dressings (reduced-sodium). Canola, safflower, olive, soybean, and sunflower oils. Avocado. Seasoning and other foods Herbs. Spices. Seasoning mixes without salt. Unsalted popcorn and pretzels. Fat-free sweets. What foods are not recommended? The items listed may not be a complete list. Talk with your dietitian about what dietary choices are best for you. Grains Baked goods made with fat, such as croissants, muffins, or some breads. Dry pasta or rice meal packs. Vegetables Creamed or fried vegetables. Vegetables in a cheese sauce. Regular canned vegetables (not low-sodium or reduced-sodium). Regular canned tomato sauce and paste (not low-sodium or reduced-sodium). Regular tomato and vegetable juice (not low-sodium or reduced-sodium). Pickles. Olives. Fruits Canned fruit in a light or heavy syrup. Fried fruit. Fruit in cream or butter sauce. Meat and other protein foods Fatty cuts of meat. Ribs. Fried meat. Bacon. Sausage. Bologna and other processed lunch meats. Salami. Fatback. Hotdogs. Bratwurst. Salted nuts and seeds. Canned beans with added salt. Canned or smoked fish. Whole eggs or egg yolks. Chicken or turkey with skin. Dairy Whole or 2% milk, cream, and half-and-half. Whole or full-fat cream cheese. Whole-fat or sweetened yogurt. Full-fat cheese. Nondairy creamers. Whipped  toppings. Processed cheese and cheese spreads. Fats and oils Butter. Stick margarine. Lard. Shortening. Ghee. Bacon fat. Tropical oils, such as coconut, palm kernel, or palm oil. Seasoning and other foods Salted popcorn and pretzels. Onion salt, garlic salt, seasoned salt, table salt, and sea salt. Worcestershire sauce. Tartar sauce. Barbecue sauce. Teriyaki sauce. Soy sauce, including reduced-sodium. Steak sauce. Canned and packaged gravies. Fish sauce. Oyster sauce. Cocktail sauce. Horseradish that you find on the shelf. Ketchup. Mustard. Meat flavorings and tenderizers. Bouillon cubes. Hot sauce and Tabasco sauce. Premade or packaged marinades. Premade or packaged taco seasonings. Relishes. Regular salad dressings. Where to find more information:  National Heart, Lung, and Blood Institute: www.nhlbi.nih.gov  American Heart Association: www.heart.org Summary  The DASH eating plan is a healthy eating plan that has been shown to reduce high blood pressure (hypertension). It may also reduce your risk for type 2 diabetes, heart disease, and stroke.  With the DASH eating plan, you should limit salt (sodium) intake to 2,300 mg a day. If you have hypertension, you may need to reduce your sodium intake to 1,500 mg a day.  When on the DASH eating plan, aim to eat more fresh fruits and vegetables, whole grains, lean proteins, low-fat dairy, and heart-healthy fats.  Work with your health care provider or diet and nutrition specialist (dietitian) to adjust your eating   plan to your individual calorie needs. This information is not intended to replace advice given to you by your health care provider. Make sure you discuss any questions you have with your health care provider. Document Released: 08/28/2011 Document Revised: 09/01/2016 Document Reviewed: 09/01/2016 Elsevier Interactive Patient Education  2019 Elsevier Inc.  Back Exercises If you have pain in your back, do these exercises 2-3 times each  day or as told by your doctor. When the pain goes away, do the exercises once each day, but repeat the steps more times for each exercise (do more repetitions). If you do not have pain in your back, do these exercises once each day or as told by your doctor. Exercises Single Knee to Chest Do these steps 3-5 times in a row for each leg: 1. Lie on your back on a firm bed or the floor with your legs stretched out. 2. Bring one knee to your chest. 3. Hold your knee to your chest by grabbing your knee or thigh. 4. Pull on your knee until you feel a gentle stretch in your lower back. 5. Keep doing the stretch for 10-30 seconds. 6. Slowly let go of your leg and straighten it. Pelvic Tilt Do these steps 5-10 times in a row: 1. Lie on your back on a firm bed or the floor with your legs stretched out. 2. Bend your knees so they point up to the ceiling. Your feet should be flat on the floor. 3. Tighten your lower belly (abdomen) muscles to press your lower back against the floor. This will make your tailbone point up to the ceiling instead of pointing down to your feet or the floor. 4. Stay in this position for 5-10 seconds while you gently tighten your muscles and breathe evenly. Cat-Cow Do these steps until your lower back bends more easily: 1. Get on your hands and knees on a firm surface. Keep your hands under your shoulders, and keep your knees under your hips. You may put padding under your knees. 2. Let your head hang down, and make your tailbone point down to the floor so your lower back is round like the back of a cat. 3. Stay in this position for 5 seconds. 4. Slowly lift your head and make your tailbone point up to the ceiling so your back hangs low (sags) like the back of a cow. 5. Stay in this position for 5 seconds.  Press-Ups Do these steps 5-10 times in a row: 1. Lie on your belly (face-down) on the floor. 2. Place your hands near your head, about shoulder-width apart. 3. While you  keep your back relaxed and keep your hips on the floor, slowly straighten your arms to raise the top half of your body and lift your shoulders. Do not use your back muscles. To make yourself more comfortable, you may change where you place your hands. 4. Stay in this position for 5 seconds. 5. Slowly return to lying flat on the floor.  Bridges Do these steps 10 times in a row: 1. Lie on your back on a firm surface. 2. Bend your knees so they point up to the ceiling. Your feet should be flat on the floor. 3. Tighten your butt muscles and lift your butt off of the floor until your waist is almost as high as your knees. If you do not feel the muscles working in your butt and the back of your thighs, slide your feet 1-2 inches farther away from your butt. 4.   Stay in this position for 3-5 seconds. 5. Slowly lower your butt to the floor, and let your butt muscles relax. If this exercise is too easy, try doing it with your arms crossed over your chest. Belly Crunches Do these steps 5-10 times in a row: 1. Lie on your back on a firm bed or the floor with your legs stretched out. 2. Bend your knees so they point up to the ceiling. Your feet should be flat on the floor. 3. Cross your arms over your chest. 4. Tip your chin a little bit toward your chest but do not bend your neck. 5. Tighten your belly muscles and slowly raise your chest just enough to lift your shoulder blades a tiny bit off of the floor. 6. Slowly lower your chest and your head to the floor. Back Lifts Do these steps 5-10 times in a row: 1. Lie on your belly (face-down) with your arms at your sides, and rest your forehead on the floor. 2. Tighten the muscles in your legs and your butt. 3. Slowly lift your chest off of the floor while you keep your hips on the floor. Keep the back of your head in line with the curve in your back. Look at the floor while you do this. 4. Stay in this position for 3-5 seconds. 5. Slowly lower your chest  and your face to the floor. Contact a doctor if:  Your back pain gets a lot worse when you do an exercise.  Your back pain does not lessen 2 hours after you exercise. If you have any of these problems, stop doing the exercises. Do not do them again unless your doctor says it is okay. Get help right away if:  You have sudden, very bad back pain. If this happens, stop doing the exercises. Do not do them again unless your doctor says it is okay. This information is not intended to replace advice given to you by your health care provider. Make sure you discuss any questions you have with your health care provider. Document Released: 10/11/2010 Document Revised: 06/02/2018 Document Reviewed: 11/02/2014 Elsevier Interactive Patient Education  2019 Elsevier Inc.  

## 2018-12-07 NOTE — Progress Notes (Signed)
Patient ID: Rebekah Salinas, female   DOB: 08-07-1956, 63 y.o.   MRN: [id number]  Chief Complaint  Patient presents with  . New Patient (Initial Visit)    establish care; pain in L shoulder, neck and arm, lower back    HPI Rebekah Salinas is a 63 y.o. female presents to establish care and for evaluation of left shoulder, neck, arm and lower back pain., hypertension, hypothyroidism, hyperlipidemia and Left breast nodule.  Left shoulder, neck and lower back pain: She states that she was going to the pain clinic for management of pain to her left shoulder, arm,  neck, lower back. Her medicaid elapsed and prior to that [facility]. She states that she's having  Constant  non radiating pain 7/10 to her left shoulder, left arm, neck and lower back. She states that she takes oxycodone 5 mg bid and 10 mg cyclobenzaprine bid with relief.  She had MRI to cervical spine on 09/24/17 which showed 1. 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 2. 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 Moderate to advanced right foraminal stenosis from asymmetric uncovertebral ridging. 3. C4-5 discogenic spinal stenosis with mild cord flattening. Mild to moderate bilateral foraminal narrowing. 4. C6-7 left foraminal protrusion and impingement. Patent spinal Canal per Rebekah Salinas.  History of Hypertension: She states that she has not taking lisinopril for approximately 3-4 months because her medicaid elapsed, but she monitors her diet checks her blood pressure at home sometimes. She denies headache, chest pain, and palpitation.  Head Tremor: She states that she has a history of head tremor with shaky voice, and states that she was put on Primodone  for  The past 5 years by a Neurologist per patient and Rebekah Spry NP at [facility], and no documentation was found. She states that the tremor has being better controlled since  taking primodone,   GERD: She reprts that she takes 20 mg Omeprazole daily for acid reflux, which is under control.  History hypothyroidism: She states that she was prescribed 88 mcg of levothyroxine daily. She has being taking it twice weekly for the past 3- 4 months, so it will last since her medicaid elapsed.  Hyperlipidemia: She reports taking 20 mg Atorvastatin  Daily and was prescribed by Rebekah Spry NP at [facility]. She denies myalgia, fever, chills and otherwise no further concern.   Past Medical History:  Diagnosis Date  . Diabetes mellitus without complication ([place])   . Hypertension   . Thyroid disease     Past Surgical History:  Procedure Laterality Date  . ABDOMINAL HYSTERECTOMY    . BREAST BIOPSY Left    neg  . BREAST BIOPSY Left    core bx- neg    Family History  Problem Relation Age of Onset  . CAD Sister   . Breast cancer Sister 1       twin sister    Social History Social History   Tobacco Use  . Smoking status: Never Smoker  . Smokeless tobacco: Never Used  Substance Use Topics  . Alcohol use: Never    Frequency: Never  . Drug use: Never    Allergies  Allergen Reactions  . Aspirin     Current Outpatient Medications  Medication Sig Dispense Refill  . atorvastatin (LIPITOR) 20 MG tablet Take 1 tablet (20 mg total) by mouth daily. 30 tablet 0  . cyclobenzaprine (FLEXERIL) 10 MG tablet Take  1 tablet (10 mg total) by mouth 2 (two) times daily. 30 tablet 0  . Levothyroxine Sodium (TIROSINT) 88 MCG CAPS Take 1 capsule (88 mcg total) by mouth daily before breakfast. 30 capsule 0  . lisinopril (PRINIVIL,ZESTRIL) 5 MG tablet Take 1 tablet (5 mg total) by mouth daily. 30 tablet 0  . omeprazole (PRILOSEC) 20 MG capsule Take 1 capsule (20 mg total) by mouth daily. 30 capsule 0  . oxyCODONE (OXY IR/ROXICODONE) 5 MG immediate release tablet Take 5 mg by mouth 2 (two) times daily as needed for severe pain.    . primidone (MYSOLINE) 50 MG tablet Take 1  tablet (50 mg total) by mouth 2 (two) times daily. 60 tablet 0   No current facility-administered medications for this visit.     Review of Systems Review of Systems  Constitutional: Negative.   HENT: Negative.   Eyes: Negative.   Respiratory: Negative.   Cardiovascular: Negative.   Gastrointestinal: Negative.   Endocrine: Negative.   Genitourinary: Negative.   Musculoskeletal: Positive for arthralgias (left shoulder, arm.), back pain (lower back) and neck pain.  Skin: Negative.   Neurological: Positive for tremors (chronic head tremor).  Psychiatric/Behavioral: Negative.     Blood pressure 138/86, pulse 93, height 5' 5"  (1.651 m), weight 219 lb 1.6 oz (99.4 kg), SpO2 95 %.  Physical Exam Physical Exam Constitutional:      Appearance: She is obese.  HENT:     Head: Normocephalic.     Right Ear: Tympanic membrane normal.     Nose: Nose normal.     Mouth/Throat:     Mouth: Mucous membranes are moist.  Eyes:     Extraocular Movements: Extraocular movements intact.     Pupils: Pupils are equal, round, and reactive to light.  Neck:     Musculoskeletal: Muscular tenderness (tenderness on palpation) present.  Cardiovascular:     Rate and Rhythm: Normal rate and regular rhythm.     Pulses: Normal pulses.     Heart sounds: Normal heart sounds.  Pulmonary:     Effort: Pulmonary effort is normal.     Breath sounds: Normal breath sounds.  Abdominal:     General: Bowel sounds are normal.     Palpations: Abdomen is soft.  Musculoskeletal:     Left shoulder: She exhibits decreased range of motion (Unable to elevate left arm above shoulder level) and tenderness (tenderness on palpation to left shoulder).     Lumbar back: She exhibits tenderness (with flexion).       Arms:  Neurological:     General: No focal deficit present.     Mental Status: She is alert and oriented to person, place, and time.     Motor: Tremor (head tremor and shaky voice) present.     Gait: Gait is  intact.  Psychiatric:        Mood and Affect: Mood normal.        Behavior: Behavior normal.        Thought Content: Thought content normal.        Judgment: Judgment normal.     Data Reviewed Medical history and labs were reviewed.  Assessment and Plan  1. Encounter to establish care - Routine labs was collected. - Comp Met (CMET); Future - CBC w/Diff; Future - HgB A1c; Future - Urinalysis; Future  2. Essential hypertension . -Low salt DASH diet . Take medications regularly on time . Exercise regularly as tolerated . Check blood pressure at least once a  week at home or a nearby pharmacy and record . Goal is less than 140/90 and normal blood pressure is 120/80  - lisinopril (PRINIVIL,ZESTRIL) 5 MG tablet; Take 1 tablet (5 mg total) by mouth daily.  Dispense: 30 tablet; Refill: 0 - Comp Met (CMET); Future  3. History of elevated lipids - She will continue on current treatment regimen - atorvastatin (LIPITOR) 20 MG tablet; Take 1 tablet (20 mg total) by mouth daily.  Dispense: 30 tablet; Refill: 0 - Lipid panel; Future - CK (Creatine Kinase); Future  4. History of gastroesophageal reflux (GERD) - Under control, will continue on current treatment regimen. - omeprazole (PRILOSEC) 20 MG capsule; Take 1 capsule (20 mg total) by mouth daily.  Dispense: 30 capsule; Refill: 0  5. Chronic low back pain, unspecified back pain laterality, unspecified whether sciatica present - She will continue on Flexeril 10 mg bid and Tylenol 500 mg every 6-8 hours as needed. - Follow up with Re[facility]ick[facility] - cyclobenzaprine (FLEXERIL) 10 MG tablet; Take 1 tablet (10 mg total) by mouth 2 (two) times daily.  Dispense: 30 tablet; Refill: 0  6. Chronic left shoulder pain - Same as in #5 - cyclobenzaprine (FLEXERIL) 10 MG tablet; Take 1 tablet (10 mg total) by mouth 2 (two) times daily.  Dispense: 30 tablet; Refill: 0  7. Neck pain - Same as in # 5 - cyclobenzaprine (FLEXERIL) 10 MG tablet; Take 1  tablet (10 mg total) by mouth 2 (two) times daily.  Dispense: 30 tablet; Refill: 0  8. Benign head tremor - She will continue on current treatment regimen and was encouraged to notify clinic of worsening symptoms. - primidone (MYSOLINE) 50 MG tablet; Take 1 tablet (50 mg total) by mouth 2 (two) times daily.  Dispense: 60 tablet; Refill: 0  9. Hypothyroidism, unspecified type - She will continue on current treatment regimen pending TSH result. - Levothyroxine Sodium (TIROSINT) 88 MCG CAPS; Take 1 capsule (88 mcg total) by mouth daily before breakfast.  Dispense: 30 capsule; Refill: 0 - TSH; Future 10. Health care Maintenance - Follow up left breast ultrasound in  April 20/20 for evaluation of stable probably mass in the 2 o'clock position of the left breast found on ultrasound performed on 08/12/2018 per [facility] Theodosia Blender.   Follow up in 2 weeks or if symptom worsens.   Mar Zettler E Iliza Blankenbeckler 12/07/2018, 11:04 PM

## 2018-12-08 LAB — URINALYSIS
Bilirubin, UA: NEGATIVE
Glucose, UA: NEGATIVE
KETONES UA: NEGATIVE
LEUKOCYTES UA: NEGATIVE
NITRITE UA: NEGATIVE
PH UA: 5 (ref 5.0–7.5)
Protein, UA: NEGATIVE
RBC, UA: NEGATIVE
Specific Gravity, UA: 1.022 (ref 1.005–1.030)
Urobilinogen, Ur: 1 mg/dL (ref 0.2–1.0)

## 2018-12-08 LAB — COMPREHENSIVE METABOLIC PANEL
A/G RATIO: 1.4 (ref 1.2–2.2)
ALBUMIN: 4.4 g/dL (ref 3.8–4.8)
ALK PHOS: 93 IU/L (ref 39–117)
ALT: 21 IU/L (ref 0–32)
AST: 20 IU/L (ref 0–40)
BILIRUBIN TOTAL: 0.3 mg/dL (ref 0.0–1.2)
BUN/Creatinine Ratio: 12 (ref 12–28)
BUN: 12 mg/dL (ref 8–27)
CHLORIDE: 104 mmol/L (ref 96–106)
CO2: 22 mmol/L (ref 20–29)
Calcium: 9.4 mg/dL (ref 8.7–10.3)
Creatinine, Ser: 0.99 mg/dL (ref 0.57–1.00)
GFR calc Af Amer: 71 mL/min/{1.73_m2} (ref >59)
GFR calc non Af Amer: 62 mL/min/{1.73_m2} (ref >59)
GLOBULIN, TOTAL: 3.1 g/dL (ref 1.5–4.5)
Glucose: 83 mg/dL (ref 65–99)
Potassium: 4.8 mmol/L (ref 3.5–5.2)
SODIUM: 142 mmol/L (ref 134–144)
Total Protein: 7.5 g/dL (ref 6.0–8.5)

## 2018-12-08 LAB — CBC WITH DIFFERENTIAL/PLATELET
BASOS ABS: 0 10*3/uL (ref 0.0–0.2)
Basos: 1 %
EOS (ABSOLUTE): 0.1 10*3/uL (ref 0.0–0.4)
Eos: 2 %
Hematocrit: 41.2 % (ref 34.0–46.6)
Hemoglobin: 13.6 g/dL (ref 11.1–15.9)
IMMATURE GRANS (ABS): 0 10*3/uL (ref 0.0–0.1)
IMMATURE GRANULOCYTES: 1 %
LYMPHS: 31 %
Lymphocytes Absolute: 1.7 10*3/uL (ref 0.7–3.1)
MCH: 28.3 pg (ref 26.6–33.0)
MCHC: 33 g/dL (ref 31.5–35.7)
MCV: 86 fL (ref 79–97)
Monocytes Absolute: 0.5 10*3/uL (ref 0.1–0.9)
Monocytes: 9 %
NEUTROS PCT: 56 %
Neutrophils Absolute: 3.2 10*3/uL (ref 1.4–7.0)
PLATELETS: 369 10*3/uL (ref 150–450)
RBC: 4.8 x10E6/uL (ref 3.77–5.28)
RDW: 13.3 % (ref 11.7–15.4)
WBC: 5.5 10*3/uL (ref 3.4–10.8)

## 2018-12-08 LAB — LIPID PANEL
CHOLESTEROL TOTAL: 236 mg/dL — AB (ref 100–199)
Chol/HDL Ratio: 4.5 ratio — ABNORMAL HIGH (ref 0.0–4.4)
HDL: 53 mg/dL (ref >39)
LDL Calculated: 156 mg/dL — ABNORMAL HIGH (ref 0–99)
Triglycerides: 135 mg/dL (ref 0–149)
VLDL CHOLESTEROL CAL: 27 mg/dL (ref 5–40)

## 2018-12-08 LAB — TSH: TSH: 2.72 u[IU]/mL (ref 0.450–4.500)

## 2018-12-08 LAB — HEMOGLOBIN A1C
ESTIMATED AVERAGE GLUCOSE: 126 mg/dL
HEMOGLOBIN A1C: 6 % — AB (ref 4.8–5.6)

## 2018-12-08 LAB — CK: Total CK: 183 U/L — ABNORMAL HIGH (ref 24–173)

## 2018-12-13 ENCOUNTER — Ambulatory Visit: Payer: Medicaid Other | Admitting: Pharmacy Technician

## 2018-12-13 ENCOUNTER — Other Ambulatory Visit: Payer: Self-pay

## 2018-12-13 DIAGNOSIS — Z79899 Other long term (current) drug therapy: Secondary | ICD-10-CM

## 2018-12-13 NOTE — Progress Notes (Signed)
Conducted phone consult.  Read [facility]'s contract.  Patient verbally acknowledged that she understood and did not have questions about the contract.  Mailing patient contract.  Patient to sign and return to [facility].  Patient approved to receive medication assistance from [facility] as long as eligibility criteria continues to be met.  Referred patient for MTM/per phone visit.  [facility]

## 2018-12-21 ENCOUNTER — Ambulatory Visit: Payer: Medicaid Other | Admitting: Gerontology

## 2018-12-21 ENCOUNTER — Other Ambulatory Visit: Payer: Self-pay

## 2018-12-21 DIAGNOSIS — I10 Essential (primary) hypertension: Secondary | ICD-10-CM

## 2018-12-21 DIAGNOSIS — G250 Essential tremor: Secondary | ICD-10-CM

## 2018-12-21 DIAGNOSIS — Z09 Encounter for follow-up examination after completed treatment for conditions other than malignant neoplasm: Secondary | ICD-10-CM

## 2018-12-21 DIAGNOSIS — M542 Cervicalgia: Secondary | ICD-10-CM

## 2018-12-21 DIAGNOSIS — Z8639 Personal history of other endocrine, nutritional and metabolic disease: Secondary | ICD-10-CM

## 2018-12-21 DIAGNOSIS — E039 Hypothyroidism, unspecified: Secondary | ICD-10-CM

## 2018-12-21 DIAGNOSIS — G8929 Other chronic pain: Secondary | ICD-10-CM

## 2018-12-21 DIAGNOSIS — M545 Low back pain, unspecified: Secondary | ICD-10-CM

## 2018-12-21 DIAGNOSIS — Z8719 Personal history of other diseases of the digestive system: Secondary | ICD-10-CM

## 2018-12-21 DIAGNOSIS — M25512 Pain in left shoulder: Secondary | ICD-10-CM

## 2018-12-21 MED ORDER — LEVOTHYROXINE SODIUM 88 MCG PO CAPS: 88.0000 ug | ORAL_CAPSULE | Freq: Every day | ORAL | 3 refills | Status: DC

## 2018-12-21 MED ORDER — PRIMIDONE 50 MG PO TABS: 50.0000 mg | ORAL_TABLET | Freq: Two times a day (BID) | ORAL | 2 refills | Status: DC

## 2018-12-21 MED ORDER — CYCLOBENZAPRINE HCL 10 MG PO TABS: 10.0000 mg | ORAL_TABLET | Freq: Two times a day (BID) | ORAL | 2 refills | Status: DC

## 2018-12-21 MED ORDER — PRIMIDONE 50 MG PO TABS: 50.0000 mg | ORAL_TABLET | Freq: Two times a day (BID) | ORAL | 3 refills | Status: DC

## 2018-12-21 MED ORDER — LISINOPRIL 5 MG PO TABS: 5.0000 mg | ORAL_TABLET | Freq: Every day | ORAL | 3 refills | Status: DC

## 2018-12-21 MED ORDER — LEVOTHYROXINE SODIUM 88 MCG PO CAPS: 88.0000 ug | ORAL_CAPSULE | Freq: Every day | ORAL | 2 refills | Status: DC

## 2018-12-21 MED ORDER — OMEPRAZOLE 20 MG PO CPDR: 20.0000 mg | DELAYED_RELEASE_CAPSULE | Freq: Every day | ORAL | 3 refills | Status: DC

## 2018-12-21 MED ORDER — CYCLOBENZAPRINE HCL 10 MG PO TABS: 10.0000 mg | ORAL_TABLET | Freq: Two times a day (BID) | ORAL | 3 refills | Status: DC

## 2018-12-21 MED ORDER — OMEPRAZOLE 20 MG PO CPDR: 20.0000 mg | DELAYED_RELEASE_CAPSULE | Freq: Every day | ORAL | 2 refills | Status: DC

## 2018-12-21 MED ORDER — LISINOPRIL 5 MG PO TABS: 5.0000 mg | ORAL_TABLET | Freq: Every day | ORAL | 2 refills | Status: DC

## 2018-12-21 MED ORDER — ATORVASTATIN CALCIUM 20 MG PO TABS: 20.0000 mg | ORAL_TABLET | Freq: Every day | ORAL | 2 refills | Status: DC

## 2018-12-21 MED ORDER — ATORVASTATIN CALCIUM 20 MG PO TABS: 20.0000 mg | ORAL_TABLET | Freq: Every day | ORAL | 3 refills | Status: DC

## 2018-12-21 NOTE — Patient Instructions (Signed)
Fat and Cholesterol Restricted Eating Plan Getting too much fat and cholesterol in your diet may cause health problems. Choosing the right foods helps keep your fat and cholesterol at normal levels. This can keep you from getting certain diseases. Your doctor may recommend an eating plan that includes:  Total fat: ______% or less of total calories a day.  Saturated fat: ______% or less of total calories a day.  Cholesterol: less than _________mg a day.  Fiber: ______g a day. What are tips for following this plan? Meal planning  At meals, divide your plate into four equal parts: ? Fill one-half of your plate with vegetables and green salads. ? Fill one-fourth of your plate with whole grains. ? Fill one-fourth of your plate with low-fat (lean) protein foods.  Eat fish that is high in omega-3 fats at least two times a week. This includes mackerel, tuna, sardines, and salmon.  Eat foods that are high in fiber, such as whole grains, beans, apples, broccoli, carrots, peas, and barley. General tips   Work with your doctor to lose weight if you need to.  Avoid: ? Foods with added sugar. ? Fried foods. ? Foods with partially hydrogenated oils.  Limit alcohol intake to no more than 1 drink a day for nonpregnant women and 2 drinks a day for men. One drink equals 12 oz of beer, 5 oz of wine, or 1 oz of hard liquor. Reading food labels  Check food labels for: ? Trans fats. ? Partially hydrogenated oils. ? Saturated fat (g) in each serving. ? Cholesterol (mg) in each serving. ? Fiber (g) in each serving.  Choose foods with healthy fats, such as: ? Monounsaturated fats. ? Polyunsaturated fats. ? Omega-3 fats.  Choose grain products that have whole grains. Look for the word "whole" as the first word in the ingredient list. Cooking  Cook foods using low-fat methods. These include baking, boiling, grilling, and broiling.  Eat more home-cooked foods. Eat at restaurants and buffets  less often.  Avoid cooking using saturated fats, such as butter, cream, palm oil, palm kernel oil, and coconut oil. Recommended foods  Fruits  All fresh, canned (in natural juice), or frozen fruits. Vegetables  Fresh or frozen vegetables (raw, steamed, roasted, or grilled). Green salads. Grains  Whole grains, such as whole wheat or whole grain breads, crackers, cereals, and pasta. Unsweetened oatmeal, bulgur, barley, quinoa, or brown rice. Corn or whole wheat flour tortillas. Meats and other protein foods  Ground beef (85% or leaner), grass-fed beef, or beef trimmed of fat. Skinless chicken or turkey. Ground chicken or turkey. Pork trimmed of fat. All fish and seafood. Egg whites. Dried beans, peas, or lentils. Unsalted nuts or seeds. Unsalted canned beans. Nut butters without added sugar or oil. Dairy  Low-fat or nonfat dairy products, such as skim or 1% milk, 2% or reduced-fat cheeses, low-fat and fat-free ricotta or cottage cheese, or plain low-fat and nonfat yogurt. Fats and oils  Tub margarine without trans fats. Light or reduced-fat mayonnaise and salad dressings. Avocado. Olive, canola, sesame, or safflower oils. The items listed above may not be a complete list of foods and beverages you can eat. Contact a dietitian for more information. Foods to avoid Fruits  Canned fruit in heavy syrup. Fruit in cream or butter sauce. Fried fruit. Vegetables  Vegetables cooked in cheese, cream, or butter sauce. Fried vegetables. Grains  White bread. White pasta. White rice. Cornbread. Bagels, pastries, and croissants. Crackers and snack foods that contain trans fat   and hydrogenated oils. Meats and other protein foods  Fatty cuts of meat. Ribs, chicken wings, bacon, sausage, bologna, salami, chitterlings, fatback, hot dogs, bratwurst, and packaged lunch meats. Liver and organ meats. Whole eggs and egg yolks. Chicken and turkey with skin. Fried meat. Dairy  Whole or 2% milk, cream,  half-and-half, and cream cheese. Whole milk cheeses. Whole-fat or sweetened yogurt. Full-fat cheeses. Nondairy creamers and whipped toppings. Processed cheese, cheese spreads, and cheese curds. Beverages  Alcohol. Sugar-sweetened drinks such as sodas, lemonade, and fruit drinks. Fats and oils  Butter, stick margarine, lard, shortening, ghee, or bacon fat. Coconut, palm kernel, and palm oils. Sweets and desserts  Corn syrup, sugars, honey, and molasses. Candy. Jam and jelly. Syrup. Sweetened cereals. Cookies, pies, cakes, donuts, muffins, and ice cream. The items listed above may not be a complete list of foods and beverages you should avoid. Contact a dietitian for more information. Summary  Choosing the right foods helps keep your fat and cholesterol at normal levels. This can keep you from getting certain diseases.  At meals, fill one-half of your plate with vegetables and green salads.  Eat high-fiber foods, like whole grains, beans, apples, carrots, peas, and barley.  Limit added sugar, saturated fats, alcohol, and fried foods. This information is not intended to replace advice given to you by your health care provider. Make sure you discuss any questions you have with your health care provider. Document Released: 03/09/2012 Document Revised: 05/12/2018 Document Reviewed: 05/26/2017 Elsevier Interactive Patient Education  2019 Elsevier Inc.   

## 2018-12-21 NOTE — Progress Notes (Signed)
Established Patient Office Visit  Subjective:  Patient ID: Rebekah Salinas, female    DOB: 1956-02-20  Age: 63 y.o. MRN: [id number]  CC:  Chief Complaint  Patient presents with  . Follow-up    HPI Rebekah Salinas presents for follow up on hypertension, hyperlipidemia,head tremor, Gerd, hypothyroidism, chronic  Left shoulder,neck and lower back pain, and left breast nodule.  Left shoulder, neck and lower back pain: She reports that her pain has greatly improved with taking 10 mg Flexeril twice daily.  She states that she continues to experience intermittent non radiating dull 5/10 pain which does not affect her ADL.  Hypertension: She states that she continues to take lisinopril 5 mg daily and checks her blood pressure once a week at home. She states that she cannot remember her last blood pressure reading.  She reports that she continues to adhere to DASH diet.  She denies headache, dizziness, cough, chest pain and palpitation.  Head tremor: She states that she continues to take primidone 50 mg twice daily and her head and voice tremors are manageable.  Gerd: She states that taking 20 mg of omeprazole controls her acid reflux.  Hypothyroidism: She reports that she takes 88 mcg of levothyroxine and denies any symptoms.  Left breast nodule: She denies pain to left breast, and will follow up for mammogram on 01/10/19, due to stable benign mass in the 2 o'clock position of the left breast on ultrasound done on 08/12/18.  Hyperlipidemia: She states that she continues to take 20 mg Atorvastatin, she denies any myalgia.  She continues on low-fat low-cholesterol diet.  Otherwise she denies fever, chills and no further concern.     Past Medical History:  Diagnosis Date  . Diabetes mellitus without complication ([facility])   . Hypertension   . Thyroid disease     Past Surgical History:  Procedure Laterality Date  . ABDOMINAL HYSTERECTOMY    . BREAST BIOPSY Left    neg  . BREAST BIOPSY Left     core bx- neg    Family History  Problem Relation Age of Onset  . CAD Sister   . Breast cancer Sister 70       twin sister    Social History   Socioeconomic History  . Marital status: Married    Spouse name: Not on file  . Number of children: Not on file  . Years of education: Not on file  . Highest education level: Not on file  Occupational History  . Not on file  Social Needs  . Financial resource strain: Not on file  . Food insecurity:    Worry: Not on file    Inability: Not on file  . Transportation needs:    Medical: Not on file    Non-medical: Not on file  Tobacco Use  . Smoking status: Never Smoker  . Smokeless tobacco: Never Used  Substance and Sexual Activity  . Alcohol use: Never    Frequency: Never  . Drug use: Never  . Sexual activity: Not on file  Lifestyle  . Physical activity:    Days per week: Not on file    Minutes per session: Not on file  . Stress: Not on file  Relationships  . Social connections:    Talks on phone: Not on file    Gets together: Not on file    Attends religious service: Not on file    Active member of club or organization: Not on file    Attends meetings  of clubs or organizations: Not on file    Relationship status: Not on file  . Intimate partner violence:    Fear of current or ex partner: Not on file    Emotionally abused: Not on file    Physically abused: Not on file    Forced sexual activity: Not on file  Other Topics Concern  . Not on file  Social History Narrative  . Not on file    Outpatient Medications Prior to Visit  Medication Sig Dispense Refill  . atorvastatin (LIPITOR) 20 MG tablet Take 1 tablet (20 mg total) by mouth daily. 30 tablet 0  . cyclobenzaprine (FLEXERIL) 10 MG tablet Take 1 tablet (10 mg total) by mouth 2 (two) times daily. 30 tablet 0  . Levothyroxine Sodium (TIROSINT) 88 MCG CAPS Take 1 capsule (88 mcg total) by mouth daily before breakfast. 30 capsule 0  . lisinopril (PRINIVIL,ZESTRIL) 5  MG tablet Take 1 tablet (5 mg total) by mouth daily. 30 tablet 0  . omeprazole (PRILOSEC) 20 MG capsule Take 1 capsule (20 mg total) by mouth daily. 30 capsule 0  . primidone (MYSOLINE) 50 MG tablet Take 1 tablet (50 mg total) by mouth 2 (two) times daily. 60 tablet 0  . oxyCODONE (OXY IR/ROXICODONE) 5 MG immediate release tablet Take 5 mg by mouth 2 (two) times daily as needed for severe pain.     No facility-administered medications prior to visit.     Allergies  Allergen Reactions  . Aspirin     ROS Review of Systems  Constitutional: Negative.   HENT: Negative.   Eyes: Negative for visual disturbance.  Respiratory: Negative.   Cardiovascular: Negative.   Gastrointestinal: Negative.   Genitourinary: Negative.   Musculoskeletal: Arthralgias: chronic left shoulder, neck, lower back.  Skin: Negative.   Neurological: Positive for tremors (chronic head and voice).  Psychiatric/Behavioral: Negative.       Objective:    Physical Exam  There were no vitals taken for this visit. Wt Readings from Last 3 Encounters:  12/07/18 219 lb 1.6 oz (99.4 kg)  06/03/16 195 lb 8 oz (88.7 kg)  04/08/16 204 lb (92.5 kg)   She was encouraged to lose weight, decrease caloric intake, exercise 30 minutes daily.  Health Maintenance Due  Topic Date Due  . Hepatitis C Screening  12-10-1955  . HIV Screening  03/05/1972  . TETANUS/TDAP  03/05/1976  . PAP SMEAR-Modifier  03/05/1978  . COLONOSCOPY  03/06/2007  . INFLUENZA VACCINE  04/22/2018    There are no preventive care reminders to display for this patient.  Lab Results  Component Value Date   TSH 2.720 12/07/2018   Lab Results  Component Value Date   WBC 5.5 12/07/2018   HGB 13.6 12/07/2018   HCT 41.2 12/07/2018   MCV 86 12/07/2018   PLT 369 12/07/2018   Lab Results  Component Value Date   NA 142 12/07/2018   K 4.8 12/07/2018   CO2 22 12/07/2018   GLUCOSE 83 12/07/2018   BUN 12 12/07/2018   CREATININE 0.99 12/07/2018    BILITOT 0.3 12/07/2018   ALKPHOS 93 12/07/2018   AST 20 12/07/2018   ALT 21 12/07/2018   PROT 7.5 12/07/2018   ALBUMIN 4.4 12/07/2018   CALCIUM 9.4 12/07/2018   ANIONGAP 10 06/02/2016   Lab Results  Component Value Date   CHOL 236 (H) 12/07/2018   Lab Results  Component Value Date   HDL 53 12/07/2018   Lab Results  Component Value Date  LDLCALC 156 (H) 12/07/2018  She was encouraged to continue on low fat low cholesterol diet, and 20 mg Atorvastatin, will recheck lipid panel in 3 months.  Lab Results  Component Value Date   TRIG 135 12/07/2018   Lab Results  Component Value Date   CHOLHDL 4.5 (H) 12/07/2018   Lab Results  Component Value Date   HGBA1C 6.0 (H) 12/07/2018   She was encouraged to continue on low-carb hydrate diet, exercise 30 minutes daily.   Assessment & Plan:    1. Follow up -She will follow-up for mammogram on April 20/2020 2. History of elevated lipids - She will continue on current treatment regimen and lipid panel will be rechecked in 3 months. . -Low fat Diet, like low fat dairy products eg skimmed milk . Avoid any fried food . Regular exercise/walk . Goal for Total Cholesterol is less than 200 . Goal for bad cholesterol LDL is less than 70 . Goal for Good cholesterol HDL is more than 45 . Goal for Triglyceride is less than 150 - Lipid panel; Future - CK (Creatine Kinase); Future - atorvastatin (LIPITOR) 20 MG tablet; Take 1 tablet (20 mg total) by mouth daily.  Dispense: 30 tablet; Refill: 3  3. Chronic low back pain, unspecified back pain laterality, unspecified whether sciatica present - She will follow up with Dr Justice Rocher - cyclobenzaprine (FLEXERIL) 10 MG tablet; Take 1 tablet (10 mg total) by mouth 2 (two) times daily.  Dispense: 30 tablet; Refill: 3  4. Chronic left shoulder pain - She will follow up with Dr Justice Rocher - cyclobenzaprine (FLEXERIL) 10 MG tablet; Take 1 tablet (10 mg total) by mouth 2 (two) times daily.  Dispense: 30  tablet; Refill: 3  5. Neck pain - She will follow up with Dr Justice Rocher - cyclobenzaprine (FLEXERIL) 10 MG tablet; Take 1 tablet (10 mg total) by mouth 2 (two) times daily.  Dispense: 30 tablet; Refill: 3  6. Hypothyroidism, unspecified type - Her TSH is 2.720, she will continue on current treatment regimen. - Levothyroxine Sodium (TIROSINT) 88 MCG CAPS; Take 1 capsule (88 mcg total) by mouth daily before breakfast.  Dispense: 30 capsule; Refill: 3  7. Essential hypertension - She will continue on current treatment regimen - lisinopril (PRINIVIL,ZESTRIL) 5 MG tablet; Take 1 tablet (5 mg total) by mouth daily.  Dispense: 30 tablet; Refill: 3 . -Low salt DASH diet . Take medications regularly on time . Exercise regularly as tolerated . Check blood pressure at least once a week at home or a nearby pharmacy and record . Goal is less than 140/90 and normal blood pressure is 120/80   8. History of gastroesophageal reflux (GERD) - She will continue on current treatment regimen. - omeprazole (PRILOSEC) 20 MG capsule; Take 1 capsule (20 mg total) by mouth daily.  Dispense: 30 capsule; Refill: 3 . -Avoid spicy, fatty and fried food . Avoid sodas and sour juices . Avoid heavy meals . Avoid eating 4 hours before bedtime . Elevate head of bed at night . May use OTC Maalox or Tums as needed for heart burns or pain in upper stomach   9. Benign head tremor - She will continue on current treatment regimen. - primidone (MYSOLINE) 50 MG tablet; Take 1 tablet (50 mg total) by mouth 2 (two) times daily.  Dispense: 60 tablet; Refill: 3   Follow-up: Return in about 3 months (around 03/09/2019), or if symptoms worsen or fail to improve.    Burnis Kaser Trellis Paganini, NP

## 2019-01-04 ENCOUNTER — Other Ambulatory Visit: Payer: Self-pay

## 2019-01-05 ENCOUNTER — Other Ambulatory Visit: Payer: Self-pay

## 2019-01-05 DIAGNOSIS — Z0000 Encounter for general adult medical examination without abnormal findings: Secondary | ICD-10-CM

## 2019-01-10 ENCOUNTER — Other Ambulatory Visit: Payer: Self-pay | Admitting: Gerontology

## 2019-01-10 ENCOUNTER — Ambulatory Visit: Payer: Medicaid Other | Attending: Oncology

## 2019-01-10 ENCOUNTER — Ambulatory Visit
Admission: RE | Admit: 2019-01-10 | Discharge: 2019-01-10 | Disposition: A | Discharge status: Home / Self Care | Payer: Medicaid Other | Source: Ambulatory Visit | Attending: Gerontology | Admitting: Gerontology

## 2019-01-10 ENCOUNTER — Other Ambulatory Visit: Payer: Self-pay

## 2019-01-10 DIAGNOSIS — Z0000 Encounter for general adult medical examination without abnormal findings: Secondary | ICD-10-CM

## 2019-01-13 ENCOUNTER — Ambulatory Visit: Payer: Medicaid Other | Admitting: Pharmacist

## 2019-01-13 ENCOUNTER — Other Ambulatory Visit: Payer: Self-pay

## 2019-01-13 ENCOUNTER — Encounter: Payer: Self-pay | Admitting: Pharmacist

## 2019-01-13 DIAGNOSIS — Z79899 Other long term (current) drug therapy: Secondary | ICD-10-CM

## 2019-01-13 NOTE — Progress Notes (Addendum)
Medication Management Clinic Visit Note  Patient: Rebekah Salinas MRN: [id number] Date of Birth: [birth year] PCP: Fayrene HelperBoswell, Chelsa H, NP   Rebekah Salinas 63 y.o. female was contacted by telephone for and outreach call with the pharmacist for an initial MTM visit.   2 identifiers were used to verify patient identity.  There were no vitals taken for this visit.  Patient Information   Past Medical History:  Diagnosis Date  . Diabetes mellitus without complication (HCC)   . Hypertension   . Thyroid disease       Past Surgical History:  Procedure Laterality Date  . ABDOMINAL HYSTERECTOMY    . BREAST BIOPSY Left    neg  . BREAST BIOPSY Left    core bx- neg     Family History  Problem Relation Age of Onset  . CAD Sister   . Breast cancer Sister [id number]       twin sister    New Diagnoses (since last visit):   Family Support: GOOD  Lifestyle Diet: ON DASH DIET            Social History   Substance and Sexual Activity  Alcohol Use Never  . Frequency: Never      Social History   Tobacco Use  Smoking Status Never Smoker  Smokeless Tobacco Never Used      Health Maintenance  Topic Date Due  . Hepatitis C Screening  012/09/58  . HIV Screening  03/05/1972  . TETANUS/TDAP  03/05/1976  . PAP SMEAR-Modifier  03/05/1978  . COLONOSCOPY  03/06/2007  . INFLUENZA VACCINE  04/23/2019  . MAMMOGRAM  01/13/2020   Outpatient Encounter Medications as of 01/13/2019  Medication Sig  . atorvastatin (LIPITOR) 20 MG tablet Take 1 tablet (20 mg total) by mouth daily.  . cyclobenzaprine (FLEXERIL) 10 MG tablet Take 1 tablet (10 mg total) by mouth 2 (two) times daily.  . Levothyroxine Sodium (TIROSINT) 88 MCG CAPS Take 1 capsule (88 mcg total) by mouth daily before breakfast.  . lisinopril (PRINIVIL,ZESTRIL) 5 MG tablet Take 1 tablet (5 mg total) by mouth daily.  Marland Kitchen. omeprazole (PRILOSEC) 20 MG capsule Take 1 capsule (20 mg total) by mouth daily.  . primidone (MYSOLINE) 50 MG  tablet Take 1 tablet (50 mg total) by mouth 2 (two) times daily.  . [DISCONTINUED] oxyCODONE (OXY IR/ROXICODONE) 5 MG immediate release tablet Take 5 mg by mouth 2 (two) times daily as needed for severe pain.   No facility-administered encounter medications on file as of 01/13/2019.     Assessment and Plan:  Compliance/Adherence: pt knows the names, frequencies, and indications for all her medications. She states that when she has her medications she never mises doses. She admits that she took medications every other day before coming to our clinic since she could not afford her medications. She states she is allergic to Aspirin. She told me that her allergy is nauseavomitting. It happened along time ago but she remembers that she was very sick and states she will not take aspirin.  HTN: pt states she does check her BP about 1x/week at home and that is usually runs about 120/80 most of the time but that sometimes it can be higher. [facility] was 138/86. I think it may have been running higher since pt was not taking her medication at frequently. She seems to be well-controlled with Lisinipril 5mg  QDAY and below goal of <130/80.  HLD: On mod-intensity Atorvastatin 20mg , denies SEs or myalgias. Recent  lipid panel on 12/07/18 TC 236 HDL 53 LDL 156 TG 135. This dose is appropriate for her calculated ASCVD 10 yr risk est at 10.4%. This was not calculated to consider DM since pt is not being treated at this time. It is not appropriate to add Aspirin 81mg  daily  for this patient since she states she has an allergy to Aspirin. Since she can not take Aspirn and has pre-diabetes will recommend increasing statin to 40mg (high-intensity).  Pre-Diabetes: Pt is not on any medication at this time. Recent A1c was 6.0 on 12/07/18  Hypothyroid: TSH from 12/07/18 of 2.72, on levothyroxine . Pt taking correctly and told me that she takes it daily in the morning 30 min before breakfast on an empty  stomach.  GERD: on Omeprazole 20mg  qday, she told me that she has been on Omeprazole for over 5 years. She states that this medication controls her acid reflux symptoms well. Will make recommendation to provider to consider checking mag and B12 levels since this medication can cause depletion of both.  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  Head/neck/muscle pain: well-controlled on cyclobenzaprine  RTC 6 mos for f/u  Denice Paradise, PharmD, RPh [facility]) [phone]

## 2019-01-18 ENCOUNTER — Other Ambulatory Visit: Payer: Self-pay | Admitting: Gerontology

## 2019-01-18 DIAGNOSIS — Z8639 Personal history of other endocrine, nutritional and metabolic disease: Secondary | ICD-10-CM

## 2019-01-18 DIAGNOSIS — Z0000 Encounter for general adult medical examination without abnormal findings: Secondary | ICD-10-CM

## 2019-01-18 MED ORDER — ATORVASTATIN CALCIUM 20 MG PO TABS: 40.0000 mg | ORAL_TABLET | Freq: Every day | ORAL | 3 refills | Status: DC

## 2019-03-09 ENCOUNTER — Other Ambulatory Visit: Payer: Medicaid Other

## 2019-03-09 ENCOUNTER — Ambulatory Visit: Payer: Medicaid Other | Admitting: Gerontology

## 2019-03-09 ENCOUNTER — Other Ambulatory Visit: Payer: Self-pay

## 2019-03-09 DIAGNOSIS — Z0000 Encounter for general adult medical examination without abnormal findings: Secondary | ICD-10-CM

## 2019-03-09 DIAGNOSIS — Z8639 Personal history of other endocrine, nutritional and metabolic disease: Secondary | ICD-10-CM

## 2019-03-10 LAB — CBC WITH DIFFERENTIAL/PLATELET
Basophils Absolute: 0.1 10*3/uL (ref 0.0–0.2)
Basos: 1 %
EOS (ABSOLUTE): 0.2 10*3/uL (ref 0.0–0.4)
Eos: 4 %
Hematocrit: 40.2 % (ref 34.0–46.6)
Hemoglobin: 12.9 g/dL (ref 11.1–15.9)
Immature Grans (Abs): 0 10*3/uL (ref 0.0–0.1)
Immature Granulocytes: 0 %
Lymphocytes Absolute: 1.6 10*3/uL (ref 0.7–3.1)
Lymphs: 35 %
MCH: 27.7 pg (ref 26.6–33.0)
MCHC: 32.1 g/dL (ref 31.5–35.7)
MCV: 87 fL (ref 79–97)
Monocytes Absolute: 0.5 10*3/uL (ref 0.1–0.9)
Monocytes: 10 %
Neutrophils Absolute: 2.3 10*3/uL (ref 1.4–7.0)
Neutrophils: 50 %
Platelets: 373 10*3/uL (ref 150–450)
RBC: 4.65 x10E6/uL (ref 3.77–5.28)
RDW: 13.4 % (ref 11.7–15.4)
WBC: 4.6 10*3/uL (ref 3.4–10.8)

## 2019-03-10 LAB — LIPID PANEL
Chol/HDL Ratio: 3.9 ratio (ref 0.0–4.4)
Cholesterol, Total: 154 mg/dL (ref 100–199)
HDL: 40 mg/dL (ref >39)
LDL Calculated: 85 mg/dL (ref 0–99)
Triglycerides: 144 mg/dL (ref 0–149)
VLDL Cholesterol Cal: 29 mg/dL (ref 5–40)

## 2019-03-10 LAB — COMPREHENSIVE METABOLIC PANEL WITH GFR
ALT: 24 [IU]/L (ref 0–32)
AST: 17 [IU]/L (ref 0–40)
Albumin/Globulin Ratio: 1.7 (ref 1.2–2.2)
Albumin: 4.3 g/dL (ref 3.8–4.8)
Alkaline Phosphatase: 86 [IU]/L (ref 39–117)
BUN/Creatinine Ratio: 13 (ref 12–28)
BUN: 13 mg/dL (ref 8–27)
Bilirubin Total: 0.4 mg/dL (ref 0.0–1.2)
CO2: 25 mmol/L (ref 20–29)
Calcium: 9 mg/dL (ref 8.7–10.3)
Chloride: 102 mmol/L (ref 96–106)
Creatinine, Ser: 0.98 mg/dL (ref 0.57–1.00)
GFR calc Af Amer: 71 mL/min/{1.73_m2}
GFR calc non Af Amer: 62 mL/min/{1.73_m2}
Globulin, Total: 2.6 g/dL (ref 1.5–4.5)
Glucose: 96 mg/dL (ref 65–99)
Potassium: 4.3 mmol/L (ref 3.5–5.2)
Sodium: 140 mmol/L (ref 134–144)
Total Protein: 6.9 g/dL (ref 6.0–8.5)

## 2019-03-10 LAB — VITAMIN B12: Vitamin B-12: 575 pg/mL (ref 232–1245)

## 2019-03-10 LAB — CK: Total CK: 157 U/L (ref 32–182)

## 2019-03-10 LAB — HEMOGLOBIN A1C
Est. average glucose Bld gHb Est-mCnc: 134 mg/dL
Hgb A1c MFr Bld: 6.3 % — ABNORMAL HIGH (ref 4.8–5.6)

## 2019-03-10 LAB — MAGNESIUM: Magnesium: 2 mg/dL (ref 1.6–2.3)

## 2019-03-11 ENCOUNTER — Other Ambulatory Visit: Payer: Self-pay | Admitting: Gerontology

## 2019-03-11 DIAGNOSIS — R7303 Prediabetes: Secondary | ICD-10-CM

## 2019-03-11 MED ORDER — METFORMIN HCL 1000 MG PO TABS: 500.0000 mg | ORAL_TABLET | Freq: Every day | ORAL | 0 refills | Status: DC

## 2019-03-15 ENCOUNTER — Ambulatory Visit: Payer: Medicaid Other | Admitting: Gerontology

## 2019-03-15 ENCOUNTER — Other Ambulatory Visit: Payer: Self-pay

## 2019-03-15 VITALS — BP 102/66 | HR 96 | Ht 64.0 in | Wt 221.6 lb

## 2019-03-15 DIAGNOSIS — M545 Low back pain, unspecified: Secondary | ICD-10-CM

## 2019-03-15 DIAGNOSIS — Z8719 Personal history of other diseases of the digestive system: Secondary | ICD-10-CM

## 2019-03-15 DIAGNOSIS — I10 Essential (primary) hypertension: Secondary | ICD-10-CM | POA: Insufficient documentation

## 2019-03-15 DIAGNOSIS — M25512 Pain in left shoulder: Secondary | ICD-10-CM

## 2019-03-15 DIAGNOSIS — Z1231 Encounter for screening mammogram for malignant neoplasm of breast: Secondary | ICD-10-CM | POA: Insufficient documentation

## 2019-03-15 DIAGNOSIS — G8929 Other chronic pain: Secondary | ICD-10-CM

## 2019-03-15 DIAGNOSIS — G250 Essential tremor: Secondary | ICD-10-CM

## 2019-03-15 DIAGNOSIS — R7303 Prediabetes: Secondary | ICD-10-CM | POA: Insufficient documentation

## 2019-03-15 DIAGNOSIS — M542 Cervicalgia: Secondary | ICD-10-CM

## 2019-03-15 DIAGNOSIS — N6321 Unspecified lump in the left breast, upper outer quadrant: Secondary | ICD-10-CM

## 2019-03-15 DIAGNOSIS — E039 Hypothyroidism, unspecified: Secondary | ICD-10-CM | POA: Insufficient documentation

## 2019-03-15 DIAGNOSIS — Z0000 Encounter for general adult medical examination without abnormal findings: Secondary | ICD-10-CM | POA: Insufficient documentation

## 2019-03-15 MED ORDER — METFORMIN HCL 1000 MG PO TABS: 1000.0000 mg | ORAL_TABLET | Freq: Every day | ORAL | 2 refills | Status: DC

## 2019-03-15 MED ORDER — CYCLOBENZAPRINE HCL 10 MG PO TABS: 10.0000 mg | ORAL_TABLET | Freq: Two times a day (BID) | ORAL | 3 refills | Status: DC

## 2019-03-15 NOTE — Patient Instructions (Signed)
Carbohydrate Counting for Diabetes Mellitus, Adult  Carbohydrate counting is a method of keeping track of how many carbohydrates you eat. Eating carbohydrates naturally increases the amount of sugar (glucose) in the blood. Counting how many carbohydrates you eat helps keep your blood glucose within normal limits, which helps you manage your diabetes (diabetes mellitus). It is important to know how many carbohydrates you can safely have in each meal. This is different for every person. A diet and nutrition specialist (registered dietitian) can help you make a meal plan and calculate how many carbohydrates you should have at each meal and snack. Carbohydrates are found in the following foods:  Grains, such as breads and cereals.  Dried beans and soy products.  Starchy vegetables, such as potatoes, peas, and corn.  Fruit and fruit juices.  Milk and yogurt.  Sweets and snack foods, such as cake, cookies, candy, chips, and soft drinks. How do I count carbohydrates? There are two ways to count carbohydrates in food. You can use either of the methods or a combination of both. Reading "Nutrition Facts" on packaged food The "Nutrition Facts" list is included on the labels of almost all packaged foods and beverages in the [place] It includes:  The serving size.  Information about nutrients in each serving, including the grams (g) of carbohydrate per serving. To use the "Nutrition Facts":  Decide how many servings you will have.  Multiply the number of servings by the number of carbohydrates per serving.  The resulting number is the total amount of carbohydrates that you will be having. Learning standard serving sizes of other foods When you eat carbohydrate foods that are not packaged or do not include "Nutrition Facts" on the label, you need to measure the servings in order to count the amount of carbohydrates:  Measure the foods that you will eat with a food scale or measuring cup, if needed.   Decide how many standard-size servings you will eat.  Multiply the number of servings by 15. Most carbohydrate-rich foods have about 15 g of carbohydrates per serving. ? For example, if you eat 8 oz (170 g) of strawberries, you will have eaten 2 servings and 30 g of carbohydrates (2 servings x 15 g = 30 g).  For foods that have more than one food mixed, such as soups and casseroles, you must count the carbohydrates in each food that is included. The following list contains standard serving sizes of common carbohydrate-rich foods. Each of these servings has about 15 g of carbohydrates:   hamburger bun or  English muffin.   oz (15 mL) syrup.   oz (14 g) jelly.  1 slice of bread.  1 six-inch tortilla.  3 oz (85 g) cooked rice or pasta.  4 oz (113 g) cooked dried beans.  4 oz (113 g) starchy vegetable, such as peas, corn, or potatoes.  4 oz (113 g) hot cereal.  4 oz (113 g) mashed potatoes or  of a large baked potato.  4 oz (113 g) canned or frozen fruit.  4 oz (120 mL) fruit juice.  4-6 crackers.  6 chicken nuggets.  6 oz (170 g) unsweetened dry cereal.  6 oz (170 g) plain fat-free yogurt or yogurt sweetened with artificial sweeteners.  8 oz (240 mL) milk.  8 oz (170 g) fresh fruit or one small piece of fruit.  24 oz (680 g) popped popcorn. Example of carbohydrate counting Sample meal  3 oz (85 g) chicken breast.  6 oz (170 g)   brown rice.  4 oz (113 g) corn.  8 oz (240 mL) milk.  8 oz (170 g) strawberries with sugar-free whipped topping. Carbohydrate calculation 1. Identify the foods that contain carbohydrates: ? Rice. ? Corn. ? Milk. ? Strawberries. 2. Calculate how many servings you have of each food: ? 2 servings rice. ? 1 serving corn. ? 1 serving milk. ? 1 serving strawberries. 3. Multiply each number of servings by 15 g: ? 2 servings rice x 15 g = 30 g. ? 1 serving corn x 15 g = 15 g. ? 1 serving milk x 15 g = 15 g. ? 1 serving  strawberries x 15 g = 15 g. 4. Add together all of the amounts to find the total grams of carbohydrates eaten: ? 30 g + 15 g + 15 g + 15 g = 75 g of carbohydrates total. Summary  Carbohydrate counting is a method of keeping track of how many carbohydrates you eat.  Eating carbohydrates naturally increases the amount of sugar (glucose) in the blood.  Counting how many carbohydrates you eat helps keep your blood glucose within normal limits, which helps you manage your diabetes.  A diet and nutrition specialist (registered dietitian) can help you make a meal plan and calculate how many carbohydrates you should have at each meal and snack. This information is not intended to replace advice given to you by your health care provider. Make sure you discuss any questions you have with your health care provider. Document Released: 09/08/2005 Document Revised: 03/18/2017 Document Reviewed: 02/20/2016 Elsevier Interactive Patient Education  2019 Elsevier Inc.  

## 2019-03-15 NOTE — Progress Notes (Signed)
Established Patient Office Visit  Subjective:  Patient ID: Rebekah Salinas, female    DOB: [birth year]  Age: 63 y.o. MRN: [id number]  CC:  Chief Complaint  Patient presents with  . Follow-up  Patient consents to telephone visit and 2 patient identifiers was used to identify patient.  HPI Rebekah Stacknnette Soltero presents for follow up hypertension, hyperlipidemia, head tremor, Gerd, hypothyroidism, chronic left shoulder, neck and lower back pain and left breast nodule. She reports that she is doing well and she is compliant with her medications. She continues to take lisinopril 5 mg daily, monitors her blood pressure at home and it's normal. She denies cough, dizziness, chest pain and palpitation. Her head and voice tremor is controlled by taking primidone 50 mg bid, and she denies acid reflux with taking 20 mg omeprazole. Her TSH done 3 months ago was 2.720 and she continues to take 88 mcg levothyroxine, denies fatigue or weakness. Her LDL done 3 months ago has improved from 156 mg/dl to 85 mg/dl, and total cholesterol improved from 236 to 154 mg/dl taking 20 mg of Atorvastatin. She denies myalgia and weakness. Her HgbA1c 6 days ago was 6.3% and she tolerates 1000 mg of metformin daily. She reports that she's adhering to low fat, low cholesterol, low carb diet and exercising as tolerated. Also she reports that she continues to experience intermittent non radiating 5/10 dull pain to her left neck, shoulder and low back which is relieved with taking 10 mg flexeril bid. She has mammogram done 2 months ago and she has stable likely benign 8 mm mass involving the upper outer quadrant of the left breast, possibly a fibroadenoma and no mammographic evidence of malignancy involving the right breast. She denies breast pain, nipple discharge and lymphadenopathy. She reports that her mood is good, denies fever, chills and no further concerns.  Past Medical History:  Diagnosis Date  . Diabetes mellitus without  complication (HCC)   . Hypertension   . Thyroid disease     Past Surgical History:  Procedure Laterality Date  . ABDOMINAL HYSTERECTOMY    . BREAST BIOPSY Left    neg  . BREAST BIOPSY Left    core bx- neg    Family History  Problem Relation Age of Onset  . CAD Sister   . Breast cancer Sister 1560       twin sister  . Cancer Sister   . Cancer Maternal Grandmother     Social History   Socioeconomic History  . Marital status: Married    Spouse name: Not on file  . Number of children: Not on file  . Years of education: Not on file  . Highest education level: Not on file  Occupational History  . Not on file  Social Needs  . Financial resource strain: Not on file  . Food insecurity    Worry: Not on file    Inability: Not on file  . Transportation needs    Medical: Not on file    Non-medical: Not on file  Tobacco Use  . Smoking status: Never Smoker  . Smokeless tobacco: Never Used  Substance and Sexual Activity  . Alcohol use: Never    Frequency: Never  . Drug use: Never  . Sexual activity: Not on file  Lifestyle  . Physical activity    Days per week: Not on file    Minutes per session: Not on file  . Stress: Not on file  Relationships  . Social connections    Talks  on phone: Not on file    Gets together: Not on file    Attends religious service: Not on file    Active member of club or organization: Not on file    Attends meetings of clubs or organizations: Not on file    Relationship status: Not on file  . Intimate partner violence    Fear of current or ex partner: Not on file    Emotionally abused: Not on file    Physically abused: Not on file    Forced sexual activity: Not on file  Other Topics Concern  . Not on file  Social History Narrative  . Not on file    Outpatient Medications Prior to Visit  Medication Sig Dispense Refill  . acetaminophen (TYLENOL) 325 MG tablet Take 650 mg by mouth every 6 (six) hours as needed.    Marland Kitchen atorvastatin (LIPITOR)  20 MG tablet Take 2 tablets (40 mg total) by mouth daily. 30 tablet 3  . Levothyroxine Sodium (TIROSINT) 88 MCG CAPS Take 1 capsule (88 mcg total) by mouth daily before breakfast. 30 capsule 3  . lisinopril (PRINIVIL,ZESTRIL) 5 MG tablet Take 1 tablet (5 mg total) by mouth daily. 30 tablet 3  . omeprazole (PRILOSEC) 20 MG capsule Take 1 capsule (20 mg total) by mouth daily. 30 capsule 3  . primidone (MYSOLINE) 50 MG tablet Take 1 tablet (50 mg total) by mouth 2 (two) times daily. 60 tablet 3  . cyclobenzaprine (FLEXERIL) 10 MG tablet Take 1 tablet (10 mg total) by mouth 2 (two) times daily. 30 tablet 3  . metFORMIN (GLUCOPHAGE) 1000 MG tablet Take 0.5 tablets (500 mg total) by mouth daily with breakfast. (Patient taking differently: Take 1,000 mg by mouth daily with breakfast. ) 30 tablet 0   No facility-administered medications prior to visit.     Allergies  Allergen Reactions  . Aspirin Nausea And Vomiting    ROS Review of Systems  Constitutional: Negative.   Eyes: Negative.   Respiratory: Negative.   Cardiovascular: Negative.   Gastrointestinal: Negative.   Endocrine: Negative.   Genitourinary: Negative.   Musculoskeletal: Positive for neck pain (chronic left neck, shoulder and low back pain).  Skin: Negative.   Neurological: Positive for tremors (chronic head and voice tremor).  Psychiatric/Behavioral: Negative.       Objective:    Physical Exam  Constitutional: She is oriented to person, place, and time. She appears well-developed and well-nourished.  HENT:  Head: Normocephalic and atraumatic.  Eyes: Pupils are equal, round, and reactive to light. EOM are normal.  Neck: Normal range of motion. Neck supple.  Cardiovascular: Normal rate, regular rhythm and normal heart sounds.  Pulmonary/Chest: Effort normal and breath sounds normal.  Abdominal: Soft. Bowel sounds are normal.  Musculoskeletal: Normal range of motion.  Neurological: She is alert and oriented to person,  place, and time.  Constant head and voice tremor.  Skin: Skin is warm and dry.  Psychiatric: She has a normal mood and affect.    BP 102/66 (BP Location: Left Arm, Patient Position: Sitting)   Pulse 96   Ht 5\' 4"  (1.626 m)   Wt 221 lb 9.6 oz (100.5 kg)   SpO2 99%   BMI 38.04 kg/m  Wt Readings from Last 3 Encounters:  03/15/19 221 lb 9.6 oz (100.5 kg)  12/07/18 219 lb 1.6 oz (99.4 kg)  06/03/16 195 lb 8 oz (88.7 kg)  She was advised to continue on weight loss regimen.   Health Maintenance Due  Topic Date Due  . Hepatitis C Screening  Oct [birth year], 1956  . HIV Screening  03/05/1972  . TETANUS/TDAP  03/05/1976  . PAP SMEAR-Modifier  03/05/1978  . COLONOSCOPY  03/06/2007    There are no preventive care reminders to display for this patient.  Lab Results  Component Value Date   TSH 2.720 12/07/2018   Lab Results  Component Value Date   WBC 4.6 03/09/2019   HGB 12.9 03/09/2019   HCT 40.2 03/09/2019   MCV 87 03/09/2019   PLT 373 03/09/2019   Lab Results  Component Value Date   NA 140 03/09/2019   K 4.3 03/09/2019   CO2 25 03/09/2019   GLUCOSE 96 03/09/2019   BUN 13 03/09/2019   CREATININE 0.98 03/09/2019   BILITOT 0.4 03/09/2019   ALKPHOS 86 03/09/2019   AST 17 03/09/2019   ALT 24 03/09/2019   PROT 6.9 03/09/2019   ALBUMIN 4.3 03/09/2019   CALCIUM 9.0 03/09/2019   ANIONGAP 10 06/02/2016   Lab Results  Component Value Date   CHOL 154 03/09/2019   Lab Results  Component Value Date   HDL 40 03/09/2019   Lab Results  Component Value Date   LDLCALC 85 03/09/2019   Lab Results  Component Value Date   TRIG 144 03/09/2019   Lab Results  Component Value Date   CHOLHDL 3.9 03/09/2019   Lab Results  Component Value Date   HGBA1C 6.3 (H) 03/09/2019      Assessment & Plan:     1. Prediabetes - Her HgbA1c was 6.3% and she will continue on current treatment and HgbA1c will be rechecked in 3 months. - metFORMIN (GLUCOPHAGE) 1000 MG tablet; Take 1 tablet  (1,000 mg total) by mouth daily with breakfast.  Dispense: 30 tablet; Refill: 2 - HgB A1c; Future --Use Diabetic diet as advised  -Regular exercise    2. Chronic low back pain, unspecified back pain laterality, unspecified whether sciatica present - She will continue on current medication. - cyclobenzaprine (FLEXERIL) 10 MG tablet; Take 1 tablet (10 mg total) by mouth 2 (two) times daily.  Dispense: 60 tablet; Refill: 3  3. Chronic left shoulder pain - Same as #2 - cyclobenzaprine (FLEXERIL) 10 MG tablet; Take 1 tablet (10 mg total) by mouth 2 (two) times daily.  Dispense: 60 tablet; Refill: 3  4. Neck pain - Same as #2 - cyclobenzaprine (FLEXERIL) 10 MG tablet; Take 1 tablet (10 mg total) by mouth 2 (two) times daily.  Dispense: 60 tablet; Refill: 3  5. Hypothyroidism, unspecified type - She will continue on current treatment regimen and will recheck TSH in 3 months  6. Essential hypertension - She will continue on current treatment regimen. -Low salt DASH diet -Exercise regularly as tolerated -Check blood pressure at least once a week at home and record -Goal is less than 140/90 and normal blood pressure is 120/80   7. History of gastroesophageal reflux (GERD) - She will continue on current treatment regimen. -Avoid spicy, fatty and fried food -Avoid sodas and sour juices -Avoid heavy meals -Avoid eating 4 hours before bedtime -Elevate head of bed at night   8. Benign head tremor - She will continue on current treatment regimen and notify clinic for worsening symptoms.  9. Mass of upper outer quadrant of left breast - She was encouraged to complete charity care application for Gyn Oncology referral for breast mass biopsy. - Ambulatory referral to Gynecologic Oncology  10. Health care maintenance She was provided with stool card  for occult blood test and [facility]. - Cytology - non gyn   Follow-up: Return in about 11 weeks (around  05/31/2019), or if symptoms worsen or fail to improve.    Delisha Peaden Trellis PaganiniE Lakeishia [place], NP

## 2019-04-01 ENCOUNTER — Other Ambulatory Visit: Payer: Self-pay | Admitting: Gerontology

## 2019-04-01 DIAGNOSIS — Z8719 Personal history of other diseases of the digestive system: Secondary | ICD-10-CM

## 2019-04-01 DIAGNOSIS — I10 Essential (primary) hypertension: Secondary | ICD-10-CM

## 2019-04-25 ENCOUNTER — Other Ambulatory Visit: Payer: Self-pay | Admitting: Gerontology

## 2019-04-25 DIAGNOSIS — E039 Hypothyroidism, unspecified: Secondary | ICD-10-CM

## 2019-04-25 MED ORDER — TIROSINT 88 MCG PO CAPS: 88.0000 ug | ORAL_CAPSULE | Freq: Every day | ORAL | 1 refills | Status: DC

## 2019-05-23 ENCOUNTER — Other Ambulatory Visit: Payer: Self-pay

## 2019-05-23 DIAGNOSIS — N6321 Unspecified lump in the left breast, upper outer quadrant: Secondary | ICD-10-CM

## 2019-05-23 NOTE — Progress Notes (Signed)
bu

## 2019-06-03 ENCOUNTER — Other Ambulatory Visit: Payer: Self-pay | Admitting: Gerontology

## 2019-06-03 DIAGNOSIS — G250 Essential tremor: Secondary | ICD-10-CM

## 2019-06-03 DIAGNOSIS — Z8639 Personal history of other endocrine, nutritional and metabolic disease: Secondary | ICD-10-CM

## 2019-06-08 ENCOUNTER — Telehealth: Payer: Self-pay

## 2019-06-08 ENCOUNTER — Encounter: Payer: Self-pay | Admitting: Surgery

## 2019-06-08 ENCOUNTER — Ambulatory Visit (INDEPENDENT_AMBULATORY_CARE_PROVIDER_SITE_OTHER): Payer: Self-pay | Admitting: Surgery

## 2019-06-08 ENCOUNTER — Other Ambulatory Visit: Payer: Self-pay

## 2019-06-08 VITALS — BP 144/83 | HR 102 | Temp 97.7°F | Resp 16 | Ht 64.0 in | Wt 215.4 lb

## 2019-06-08 DIAGNOSIS — N6320 Unspecified lump in the left breast, unspecified quadrant: Secondary | ICD-10-CM

## 2019-06-08 DIAGNOSIS — N6321 Unspecified lump in the left breast, upper outer quadrant: Secondary | ICD-10-CM

## 2019-06-08 NOTE — Progress Notes (Signed)
06/08/2019  Reason for Visit:  Left breast mass  Referring Provider:  Eulogio Bearhioma Iloabachie, NP  History of Present Illness: Rebekah Salinas is a 63 y.o. female presenting today for evaluation of a left breast mass.  This was first discovered in April 2019, after the patient was having some soreness on the left breast.  Mammogram was done at that point which showed an oval circumscribed mass in the upper outer quadrant of the left breast.  Her ultrasound showed two cyst in the left breast, one at 2 o'clock, 6 cm from nipple likely a complicated cyst measuring 8 x 4 x 5 mm.  There was an additional cyst at 2 o'clock, 3 cm from nipple measuring 6 mm.  This was classified as probably benign, BIRADS 3.  She had a follow up diagnostic mammogram and ultrasound on 08/12/18 which showed stable findings, BIRADS 3.  She had her most recent mammogram and ultrasound on 01/10/19, and this showed again stable findings.  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  Patient has a history of prior left breast lumpectomy at [facility] in the 1990s.  I do not have access to those records, but she reports it was a benign finding.  She did not need chemotherapy or radiation.  Patient reports her twin sister has a history of breast cancer, diagnosed in 2018.  She did not require chemotherapy but had radiation.  Past Medical History: Past Medical History:  Diagnosis Date  . Diabetes mellitus without complication (HCC)   . Hypertension   . Thyroid disease      Past Surgical History: Past Surgical History:  Procedure Laterality Date  . ABDOMINAL HYSTERECTOMY    . BREAST BIOPSY Left    neg  . BREAST BIOPSY Left    core bx- neg  . TUBAL LIGATION      Home Medications: Prior to Admission medications   Medication Sig Start Date End Date Taking? Authorizing Provider  acetaminophen (TYLENOL) 325 MG tablet Take 650 mg by mouth  every 6 (six) hours as needed.   Yes [provider]  atorvastatin (LIPITOR) 40 MG tablet TAKE ONE TABLET BY MOUTH EVERY DAY 06/07/19  Yes Iloabachie, Chioma E, NP  cyclobenzaprine (FLEXERIL) 10 MG tablet Take 1 tablet (10 mg total) by mouth 2 (two) times daily. 03/15/19  Yes Iloabachie, Chioma E, NP  levothyroxine (SYNTHROID) 88 MCG tablet TAKE ONE CAPSULE BY MOUTH EVERY DAY BEFORE BREAKFAST 04/25/19  Yes Iloabachie, Chioma E, NP  Levothyroxine Sodium (TIROSINT) 88 MCG CAPS Take 1 capsule (88 mcg total) by mouth daily before breakfast. 04/25/19  Yes Iloabachie, Chioma E, NP  lisinopril (ZESTRIL) 5 MG tablet TAKE ONE TABLET BY MOUTH EVERY DAY 04/05/19  Yes Iloabachie, Chioma E, NP  metFORMIN (GLUCOPHAGE) 1000 MG tablet Take 1 tablet (1,000 mg total) by mouth daily with breakfast. 03/15/19  Yes Iloabachie, Chioma E, NP  omeprazole (PRILOSEC) 20 MG capsule TAKE ONE CAPSULE BY MOUTH EVERY DAY 04/05/19  Yes Iloabachie, Chioma E, NP  primidone (MYSOLINE) 50 MG tablet TAKE ONE TABLET BY MOUTH 2 TIMES A DAY 06/07/19  Yes Iloabachie, Chioma E, NP    Allergies: Allergies  Allergen Reactions  . Aspirin Nausea And Vomiting    Social History:  reports that she has never smoked. She has never used smokeless tobacco. She reports that she does not drink alcohol or use drugs.   Family History:  Family History  Problem Relation Age of Onset  . CAD Sister   . Breast cancer Sister 46       twin sister  . Cancer Sister   . Cancer Maternal Grandmother     Review of Systems: Review of Systems  Constitutional: Negative for chills and fever.  Respiratory: Negative for shortness of breath.   Cardiovascular: Negative for chest pain.  Gastrointestinal: Negative for abdominal pain, nausea and vomiting.  Genitourinary: Negative for dysuria.  Musculoskeletal: Negative for myalgias.  Skin:       Soreness left breast.  Neurological: Negative for dizziness.  Psychiatric/Behavioral: Negative for depression.     Physical Exam BP (!) 144/83   Pulse (!) 102   Temp 97.7 F (36.5 C) (Temporal)   Resp 16   Ht 5\' 4"  (1.626 m)   Wt 215 lb 6.4 oz (97.7 kg)   SpO2 96%   BMI 36.97 kg/m  CONSTITUTIONAL: No acute distress HEENT:  Normocephalic, atraumatic, extraocular motion intact. NECK: Trachea is midline, and there is no jugular venous distension.  RESPIRATORY:  Lungs are clear, and breath sounds are equal bilaterally. Normal respiratory effort without pathologic use of accessory muscles. CARDIOVASCULAR: Heart is regular without murmurs, gallops, or rubs. BREAST:  Left breast s/p remote lumpectomy, with scar in the lower outer quadrant, well healed.  This corresponds to the area of soreness for the patient.  I am unable to palpate the mass found on her mammogram at 2 o'clock 6 cm from nipple.  No palpable masses, skin changes, or nipple changes.  No left axillary or supraclavicular lymphadenopathy.  On the right side, there are no palpable masses, skin changes, or nipple changes.  No right axillary or supraclavicular lymphadenopathy. GI: The abdomen is soft, non-distended, non-tender.  MUSCULOSKELETAL:  Normal muscle strength and tone in all four extremities.  No peripheral edema or cyanosis. SKIN: Skin turgor is normal. There are no pathologic skin lesions.  NEUROLOGIC:  Motor and sensation is grossly normal.  Cranial nerves are grossly intact. PSYCH:  Alert and oriented to person, place and time. Affect is normal.  Laboratory Analysis: No results found for this or any previous visit (from the past 24 hour(s)).  Imaging: Mammogram and U/S 01/10/19: FINDINGS: Tomosynthesis and synthesized full field CC and MLO views of both breasts were obtained.  The previously identified mass in the [place] breast is less conspicuous on the current mammogram. No new or suspicious findings in the LEFT breast.  No findings suspicious for malignancy in the RIGHT breast.  Mammographic images were  processed with CAD.  Targeted LEFT breast ultrasound is performed, showing the previously identified circumscribed oval parallel hypoechoic mass at the 2 o'clock position approximately 6 cm from the nipple at MIDDLE depth measuring approximately 4 x 8 x 4 mm, unchanged from the prior ultrasounds dating back to 01/25/2018.  IMPRESSION: 1. 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 2. No mammographic evidence of malignancy involving the RIGHT breast.  RECOMMENDATION: BILATERAL diagnostic mammography and LEFT breast ultrasound in 1 year.  Assessment and Plan: This is a 63 y.o. female with left breast mass, likely benign.  Discussed with the patient that the stability of the mass on her mammograms in 2019 and 2020 suggest that this is a benign mass.  Unfortunately I cannot assure this 100%.  Discussed with her that we can continue doing the mammogram follow up as recommended, which she would need  one in a year, or we could proceed with a biopsy of the mass to find out what it is.  Given her sister's cancer history, she wants to be sure of what it is and would rather do a biopsy first.  This is very reasonable and will order this.  Patient will have biopsy of left breast under ultrasound guidance at [facility].  She will then follow up with me to discuss results and if anything further is needed.  Patient understands this plan and all of her questions have been answered.  Face-to-face time spent with the patient and care providers was 60 minutes, with more than 50% of the time spent counseling, educating, and coordinating care of the patient.     Howie Ill, MD [facility]

## 2019-06-08 NOTE — Telephone Encounter (Signed)
Spoke with Dollene Cleveland @ [facility] for US guided biopsy left breast- she will schedule appointment and call patient

## 2019-06-08 NOTE — Patient Instructions (Addendum)
We will get a biopsy of the left breast. Samantha from [facility] will call you with information regarding the appointment.   Please see your follow up appointment listed below.below.

## 2019-06-09 ENCOUNTER — Other Ambulatory Visit: Payer: Self-pay | Admitting: Surgery

## 2019-06-09 DIAGNOSIS — N6320 Unspecified lump in the left breast, unspecified quadrant: Secondary | ICD-10-CM

## 2019-06-13 ENCOUNTER — Ambulatory Visit
Admission: RE | Admit: 2019-06-13 | Discharge: 2019-06-13 | Disposition: A | Discharge status: Home / Self Care | Payer: Medicaid Other | Source: Ambulatory Visit | Attending: Surgery | Admitting: Surgery

## 2019-06-13 DIAGNOSIS — N6320 Unspecified lump in the left breast, unspecified quadrant: Secondary | ICD-10-CM | POA: Insufficient documentation

## 2019-06-13 HISTORY — PX: BREAST BIOPSY: SHX20

## 2019-06-14 LAB — SURGICAL PATHOLOGY

## 2019-06-15 ENCOUNTER — Other Ambulatory Visit: Payer: Medicaid Other

## 2019-06-15 ENCOUNTER — Other Ambulatory Visit: Payer: Self-pay

## 2019-06-15 DIAGNOSIS — E039 Hypothyroidism, unspecified: Secondary | ICD-10-CM

## 2019-06-15 DIAGNOSIS — R7303 Prediabetes: Secondary | ICD-10-CM

## 2019-06-15 MED ORDER — LEVOTHYROXINE SODIUM 88 MCG PO TABS: ORAL_TABLET | ORAL | 0 refills | Status: DC

## 2019-06-16 ENCOUNTER — Other Ambulatory Visit: Payer: Self-pay | Admitting: Gerontology

## 2019-06-16 DIAGNOSIS — E039 Hypothyroidism, unspecified: Secondary | ICD-10-CM

## 2019-06-16 LAB — HEMOGLOBIN A1C
Est. average glucose Bld gHb Est-mCnc: 126 mg/dL
Hgb A1c MFr Bld: 6 % — ABNORMAL HIGH (ref 4.8–5.6)

## 2019-06-16 LAB — TSH: TSH: 0.23 u[IU]/mL — ABNORMAL LOW (ref 0.450–4.500)

## 2019-06-21 ENCOUNTER — Ambulatory Visit: Payer: Medicaid Other

## 2019-06-23 ENCOUNTER — Other Ambulatory Visit: Payer: Self-pay

## 2019-06-23 ENCOUNTER — Telehealth: Payer: Self-pay

## 2019-06-23 ENCOUNTER — Ambulatory Visit: Payer: Medicaid Other | Admitting: Family Medicine

## 2019-06-23 VITALS — BP 121/78 | HR 97 | Temp 97.4°F | Ht 64.0 in | Wt 216.0 lb

## 2019-06-23 DIAGNOSIS — Z09 Encounter for follow-up examination after completed treatment for conditions other than malignant neoplasm: Secondary | ICD-10-CM

## 2019-06-23 DIAGNOSIS — N6321 Unspecified lump in the left breast, upper outer quadrant: Secondary | ICD-10-CM

## 2019-06-23 NOTE — Patient Instructions (Signed)
Health Maintenance, Female Adopting a healthy lifestyle and getting preventive care are important in promoting health and wellness. Ask your health care provider about:  The right schedule for you to have regular tests and exams.  Things you can do on your own to prevent diseases and keep yourself healthy. What should I know about diet, weight, and exercise? Eat a healthy diet   Eat a diet that includes plenty of vegetables, fruits, low-fat dairy products, and lean protein.  Do not eat a lot of foods that are high in solid fats, added sugars, or sodium. Maintain a healthy weight Body mass index (BMI) is used to identify weight problems. It estimates body fat based on height and weight. Your health care provider can help determine your BMI and help you achieve or maintain a healthy weight. Get regular exercise Get regular exercise. This is one of the most important things you can do for your health. Most adults should:  Exercise for at least 150 minutes each week. The exercise should increase your heart rate and make you sweat (moderate-intensity exercise).  Do strengthening exercises at least twice a week. This is in addition to the moderate-intensity exercise.  Spend less time sitting. Even light physical activity can be beneficial. Watch cholesterol and blood lipids Have your blood tested for lipids and cholesterol at 62 years of age, then have this test every 5 years. Have your cholesterol levels checked more often if:  Your lipid or cholesterol levels are high.  You are older than 62 years of age.  You are at high risk for heart disease. What should I know about cancer screening? Depending on your health history and family history, you may need to have cancer screening at various ages. This may include screening for:  Breast cancer.  Cervical cancer.  Colorectal cancer.  Skin cancer.  Lung cancer. What should I know about heart disease, diabetes, and high blood  pressure? Blood pressure and heart disease  High blood pressure causes heart disease and increases the risk of stroke. This is more likely to develop in people who have high blood pressure readings, are of African descent, or are overweight.  Have your blood pressure checked: ? Every 3-5 years if you are 18-39 years of age. ? Every year if you are 40 years old or older. Diabetes Have regular diabetes screenings. This checks your fasting blood sugar level. Have the screening done:  Once every three years after age 40 if you are at a normal weight and have a low risk for diabetes.  More often and at a younger age if you are overweight or have a high risk for diabetes. What should I know about preventing infection? Hepatitis B If you have a higher risk for hepatitis B, you should be screened for this virus. Talk with your health care provider to find out if you are at risk for hepatitis B infection. Hepatitis C Testing is recommended for:  Everyone born from 1945 through 1965.  Anyone with known risk factors for hepatitis C. Sexually transmitted infections (STIs)  Get screened for STIs, including gonorrhea and chlamydia, if: ? You are sexually active and are younger than 62 years of age. ? You are older than 62 years of age and your health care provider tells you that you are at risk for this type of infection. ? Your sexual activity has changed since you were last screened, and you are at increased risk for chlamydia or gonorrhea. Ask your health care provider if   you are at risk.  Ask your health care provider about whether you are at high risk for HIV. Your health care provider may recommend a prescription medicine to help prevent HIV infection. If you choose to take medicine to prevent HIV, you should first get tested for HIV. You should then be tested every 3 months for as long as you are taking the medicine. Pregnancy  If you are about to stop having your period (premenopausal) and  you may become pregnant, seek counseling before you get pregnant.  Take 400 to 800 micrograms (mcg) of folic acid every day if you become pregnant.  Ask for birth control (contraception) if you want to prevent pregnancy. Osteoporosis and menopause Osteoporosis is a disease in which the bones lose minerals and strength with aging. This can result in bone fractures. If you are 65 years old or older, or if you are at risk for osteoporosis and fractures, ask your health care provider if you should:  Be screened for bone loss.  Take a calcium or vitamin D supplement to lower your risk of fractures.  Be given hormone replacement therapy (HRT) to treat symptoms of menopause. Follow these instructions at home: Lifestyle  Do not use any products that contain nicotine or tobacco, such as cigarettes, e-cigarettes, and chewing tobacco. If you need help quitting, ask your health care provider.  Do not use street drugs.  Do not share needles.  Ask your health care provider for help if you need support or information about quitting drugs. Alcohol use  Do not drink alcohol if: ? Your health care provider tells you not to drink. ? You are pregnant, may be pregnant, or are planning to become pregnant.  If you drink alcohol: ? Limit how much you use to 0-1 drink a day. ? Limit intake if you are breastfeeding.  Be aware of how much alcohol is in your drink. In the [place] one drink equals one 12 oz bottle of beer (355 mL), one 5 oz glass of wine (148 mL), or one 1 oz glass of hard liquor (44 mL). General instructions  Schedule regular health, dental, and eye exams.  Stay current with your vaccines.  Tell your health care provider if: ? You often feel depressed. ? You have ever been abused or do not feel safe at home. Summary  Adopting a healthy lifestyle and getting preventive care are important in promoting health and wellness.  Follow your health care provider's instructions about healthy  diet, exercising, and getting tested or screened for diseases.  Follow your health care provider's instructions on monitoring your cholesterol and blood pressure. This information is not intended to replace advice given to you by your health care provider. Make sure you discuss any questions you have with your health care provider. Document Released: 03/24/2011 Document Revised: 09/01/2018 Document Reviewed: 09/01/2018 Elsevier Patient Education  2020 Elsevier Inc.  

## 2019-06-23 NOTE — Progress Notes (Signed)
Established Patient Office Visit  Subjective:  Patient ID: Rebekah Salinas, female    DOB: 02-27-1956  Age: 63 y.o. MRN: [id number]  CC:  Chief Complaint  Patient presents with  . Follow-up    Discuss biopsy had at [facility]    HPI Rebekah Salinas presents for pathology results. Patient states that she had a left breast on 06/13/2019. Patient states that she has a follow up appointment with the breast surgeon in the morning. Patient states that she was informed of the results over the phone a few days prior to this visit.  Her main concern today is pertaining to a bill that she received from the hospital.  She reports mild tenderness to the left breast s/p biopsy. No other problems or concerns.  Past Medical History:  Diagnosis Date  . Diabetes mellitus without complication ([place])   . Hypertension   . Thyroid disease     Past Surgical History:  Procedure Laterality Date  . ABDOMINAL HYSTERECTOMY    . BREAST BIOPSY Left    neg  . BREAST BIOPSY Left    core bx- neg  . BREAST BIOPSY Left 06/13/2019   [place] bx 2:00 coil   . TUBAL LIGATION      Family History  Problem Relation Age of Onset  . CAD Sister   . Breast cancer Sister 60       twin sister  . Cancer Sister   . Cancer Maternal Grandmother     Social History   Socioeconomic History  . Marital status: Married    Spouse name: Not on file  . Number of children: Not on file  . Years of education: Not on file  . Highest education level: Not on file  Occupational History  . Not on file  Social Needs  . Financial resource strain: Not on file  . Food insecurity    Worry: Not on file    Inability: Not on file  . Transportation needs    Medical: Not on file    Non-medical: Not on file  Tobacco Use  . Smoking status: Never Smoker  . Smokeless tobacco: Never Used  Substance and Sexual Activity  . Alcohol use: Never    Frequency: Never  . Drug use: Never  . Sexual activity: Yes    Birth control/protection: None   Lifestyle  . Physical activity    Days per week: Not on file    Minutes per session: Not on file  . Stress: Not on file  Relationships  . Social Herbalist on phone: Not on file    Gets together: Not on file    Attends religious service: Not on file    Active member of club or organization: Not on file    Attends meetings of clubs or organizations: Not on file    Relationship status: Not on file  . Intimate partner violence    Fear of current or ex partner: Not on file    Emotionally abused: Not on file    Physically abused: Not on file    Forced sexual activity: Not on file  Other Topics Concern  . Not on file  Social History Narrative  . Not on file    Outpatient Medications Prior to Visit  Medication Sig Dispense Refill  . acetaminophen (TYLENOL) 325 MG tablet Take 650 mg by mouth every 6 (six) hours as needed.    Marland Kitchen atorvastatin (LIPITOR) 40 MG tablet TAKE ONE TABLET BY MOUTH EVERY DAY  90 tablet 0  . cyclobenzaprine (FLEXERIL) 10 MG tablet Take 1 tablet (10 mg total) by mouth 2 (two) times daily. 60 tablet 3  . levothyroxine (SYNTHROID) 88 MCG tablet TAKE ONE CAPSULE BY MOUTH EVERY DAY BEFORE BREAKFAST 30 tablet 0  . lisinopril (ZESTRIL) 5 MG tablet TAKE ONE TABLET BY MOUTH EVERY DAY 90 tablet 0  . metFORMIN (GLUCOPHAGE) 1000 MG tablet Take 1 tablet (1,000 mg total) by mouth daily with breakfast. 30 tablet 2  . omeprazole (PRILOSEC) 20 MG capsule TAKE ONE CAPSULE BY MOUTH EVERY DAY 90 capsule 0  . primidone (MYSOLINE) 50 MG tablet TAKE ONE TABLET BY MOUTH 2 TIMES A DAY 120 tablet 0   No facility-administered medications prior to visit.     Allergies  Allergen Reactions  . Aspirin Nausea And Vomiting    ROS Review of Systems  Constitutional: Negative.   HENT: Negative.   Eyes: Negative.   Respiratory: Negative.   Cardiovascular: Negative.   Gastrointestinal: Negative.   Endocrine: Negative.   Genitourinary: Negative.   Musculoskeletal: Negative.    Skin: Negative.   Allergic/Immunologic: Negative.   Neurological: Negative.   Hematological: Negative.   Psychiatric/Behavioral: Negative.       Objective:    Physical Exam  Constitutional: She is oriented to person, place, and time. She appears well-developed and well-nourished. No distress.  HENT:  Head: Normocephalic and atraumatic.  Eyes: Pupils are equal, round, and reactive to light. Conjunctivae and EOM are normal.  Neck: Normal range of motion.  Cardiovascular: Normal rate, regular rhythm and normal heart sounds.  Pulmonary/Chest: Effort normal and breath sounds normal. No respiratory distress.  Musculoskeletal: Normal range of motion.  Neurological: She is alert and oriented to person, place, and time.  Skin: Skin is warm and dry.  Psychiatric: She has a normal mood and affect. Her behavior is normal. Judgment and thought content normal.  Nursing note and vitals reviewed.   BP 121/78   Pulse 97   Temp (!) 97.4 F (36.3 C)   Ht 5\' 4"  (1.626 m) Comment: self-reported  Wt 216 lb (98 kg)   SpO2 97%   BMI 37.08 kg/m  Wt Readings from Last 3 Encounters:  06/23/19 216 lb (98 kg)  06/08/19 215 lb 6.4 oz (97.7 kg)  03/15/19 221 lb 9.6 oz (100.5 kg)     Health Maintenance Due  Topic Date Due  . Hepatitis C Screening  12/07/1956  . HIV Screening  03/05/1972  . TETANUS/TDAP  03/05/1976  . PAP SMEAR-Modifier  03/05/1978  . COLONOSCOPY  03/06/2007    There are no preventive care reminders to display for this patient.  Lab Results  Component Value Date   TSH 0.230 (L) 06/15/2019   Lab Results  Component Value Date   WBC 4.6 03/09/2019   HGB 12.9 03/09/2019   HCT 40.2 03/09/2019   MCV 87 03/09/2019   PLT 373 03/09/2019   Lab Results  Component Value Date   NA 140 03/09/2019   K 4.3 03/09/2019   CO2 25 03/09/2019   GLUCOSE 96 03/09/2019   BUN 13 03/09/2019   CREATININE 0.98 03/09/2019   BILITOT 0.4 03/09/2019   ALKPHOS 86 03/09/2019   AST 17  03/09/2019   ALT 24 03/09/2019   PROT 6.9 03/09/2019   ALBUMIN 4.3 03/09/2019   CALCIUM 9.0 03/09/2019   ANIONGAP 10 06/02/2016   Lab Results  Component Value Date   CHOL 154 03/09/2019   Lab Results  Component Value Date  HDL 40 03/09/2019   Lab Results  Component Value Date   LDLCALC 85 03/09/2019   Lab Results  Component Value Date   TRIG 144 03/09/2019   Lab Results  Component Value Date   CHOLHDL 3.9 03/09/2019   Lab Results  Component Value Date   HGBA1C 6.0 (H) 06/15/2019      Assessment & Plan:   Problem List Items Addressed This Visit      Other   Mass of upper outer quadrant of left breast    Other Visit Diagnoses    Follow up    -  Primary    Reviewed pathology results with patient. Instructed patient to call [employer] for more information regarding her bill. Number given to patient.   No orders of the defined types were placed in this encounter.   Follow-up: Return in about 3 months (around 09/23/2019) for Diabetes management.    Mike GipAndre Damel Querry, FNP

## 2019-06-23 NOTE — Telephone Encounter (Signed)
Patient notified of negative pathology for breast biopsy- patient place in recall for March 2021 for mammogram and breast exam.

## 2019-06-24 ENCOUNTER — Other Ambulatory Visit: Payer: Self-pay

## 2019-06-24 ENCOUNTER — Encounter: Payer: Self-pay | Admitting: Surgery

## 2019-06-24 ENCOUNTER — Ambulatory Visit (INDEPENDENT_AMBULATORY_CARE_PROVIDER_SITE_OTHER): Payer: Self-pay | Admitting: Surgery

## 2019-06-24 VITALS — BP 120/79 | HR 90 | Temp 97.5°F | Ht 64.0 in | Wt 214.0 lb

## 2019-06-24 DIAGNOSIS — N6320 Unspecified lump in the left breast, unspecified quadrant: Secondary | ICD-10-CM

## 2019-06-24 NOTE — Patient Instructions (Signed)
We will contact you in March 2021 to schedule your bilateral mammogram  and breast exam April 2021.  Please call our office if you have not heard from [place] the end of March.

## 2019-06-24 NOTE — Progress Notes (Signed)
06/24/2019  History of Present Illness: Rebekah Salinas is a 63 y.o. female with a history of left breast mass at 2 o'clock, 6 cm from the nipple.  I was found on 01/13/18 and had been stable on subsequent mammograms in 07/2018 and 12/2018.  Given family history of breast cancer, she wanted to have a biopsy to make sure it was benign.  She underwent biopsy on 06/13/19 which showed fibrocystic changes, benign calcifications, and no atypia or malignancy.  She presents today to discuss results.  She denies any issues with the biopsy site and denies any ecchymosis or tenderness.  Past Medical History: Past Medical History:  Diagnosis Date  . Diabetes mellitus without complication ([place])   . Hypertension   . Thyroid disease      Past Surgical History: Past Surgical History:  Procedure Laterality Date  . ABDOMINAL HYSTERECTOMY    . BREAST BIOPSY Left    neg  . BREAST BIOPSY Left    core bx- neg  . BREAST BIOPSY Left 06/13/2019   [place] bx 2:00 coil   . TUBAL LIGATION      Home Medications: Prior to Admission medications   Medication Sig Start Date End Date Taking? Authorizing Provider  acetaminophen (TYLENOL) 325 MG tablet Take 650 mg by mouth every 6 (six) hours as needed.   Yes [provider]  atorvastatin (LIPITOR) 40 MG tablet TAKE ONE TABLET BY MOUTH EVERY DAY 06/07/19  Yes Iloabachie, Chioma E, NP  cyclobenzaprine (FLEXERIL) 10 MG tablet Take 1 tablet (10 mg total) by mouth 2 (two) times daily. 03/15/19  Yes Iloabachie, Chioma E, NP  levothyroxine (SYNTHROID) 88 MCG tablet TAKE ONE CAPSULE BY MOUTH EVERY DAY BEFORE BREAKFAST 06/15/19  Yes Iloabachie, Chioma E, NP  lisinopril (ZESTRIL) 5 MG tablet TAKE ONE TABLET BY MOUTH EVERY DAY 04/05/19  Yes Iloabachie, Chioma E, NP  metFORMIN (GLUCOPHAGE) 1000 MG tablet Take 1 tablet (1,000 mg total) by mouth daily with breakfast. 03/15/19  Yes Iloabachie, Chioma E, NP  omeprazole (PRILOSEC) 20 MG capsule TAKE ONE CAPSULE BY MOUTH EVERY DAY  04/05/19  Yes Iloabachie, Chioma E, NP  primidone (MYSOLINE) 50 MG tablet TAKE ONE TABLET BY MOUTH 2 TIMES A DAY 06/07/19  Yes Iloabachie, Chioma E, NP    Allergies: Allergies  Allergen Reactions  . Aspirin Nausea And Vomiting    Review of Systems: Review of Systems  Constitutional: Negative for chills and fever.  Respiratory: Negative for shortness of breath.   Cardiovascular: Negative for chest pain.  Gastrointestinal: Negative for nausea and vomiting.    Physical Exam BP 120/79   Pulse 90   Temp (!) 97.5 F (36.4 C) (Temporal)   Ht 5\' 4"  (1.626 m)   Wt 214 lb (97.1 kg)   SpO2 96%   BMI 36.73 kg/m  CONSTITUTIONAL: No acute distress HEENT:  Normocephalic, atraumatic, extraocular motion intact. RESPIRATORY:  Lungs are clear, and breath sounds are equal bilaterally. Normal respiratory effort without pathologic use of accessory muscles. CARDIOVASCULAR: Heart is regular without murmurs, gallops, or rubs. BREAST:  Left breast examined.  No palpable masses, skin changes except the biopsy site, and no nipple drainage.  Biopsy site is healing well, without any ecchymosis or hematoma. NEUROLOGIC:  Motor and sensation is grossly normal.  Cranial nerves are grossly intact. PSYCH:  Alert and oriented to person, place and time. Affect is normal.  Labs/Imaging: Pathology 06/13/19: DIAGNOSIS:  A. LEFT BREAST, ULTRASOUND-GUIDED NEEDLE CORE BIOPSY:  - BENIGN BREAST TISSUE WITH:    -  FIBROCYSTIC CHANGES    - COLUMNAR CELL CHANGE    - APOCRINE METAPLASIA    - CALCIFICATIONS IN ASSOCIATION WITH BENIGN BREAST TISSUE  - NEGATIVE FOR ATYPIA AND MALIGNANCY  Assessment and Plan: This is a 63 y.o. female s/p left breast lumpectomy.  Discussed pathology results with the patient.  All benign findings.  Discussed with her recommendations for follow up mammogram next year in April.  As long as things are stable then, we would just continue with yearly schedule.  Face-to-face time  spent with the patient and care providers was 15 minutes, with more than 50% of the time spent counseling, educating, and coordinating care of the patient.     Howie Ill, MD [facility]

## 2019-07-20 ENCOUNTER — Other Ambulatory Visit: Payer: Self-pay

## 2019-07-20 DIAGNOSIS — E039 Hypothyroidism, unspecified: Secondary | ICD-10-CM

## 2019-07-21 LAB — TSH: TSH: 0.586 u[IU]/mL (ref 0.450–4.500)

## 2019-08-22 ENCOUNTER — Other Ambulatory Visit: Payer: Medicaid Other

## 2019-09-01 ENCOUNTER — Other Ambulatory Visit: Payer: Self-pay

## 2019-09-01 DIAGNOSIS — G250 Essential tremor: Secondary | ICD-10-CM

## 2019-09-01 MED ORDER — PRIMIDONE 50 MG PO TABS: ORAL_TABLET | ORAL | 0 refills | Status: DC

## 2019-09-12 ENCOUNTER — Other Ambulatory Visit: Payer: Self-pay | Admitting: Gerontology

## 2019-09-12 DIAGNOSIS — E039 Hypothyroidism, unspecified: Secondary | ICD-10-CM

## 2019-09-12 DIAGNOSIS — R7303 Prediabetes: Secondary | ICD-10-CM

## 2019-09-27 ENCOUNTER — Ambulatory Visit: Payer: Medicaid Other | Admitting: Gerontology

## 2019-09-29 ENCOUNTER — Ambulatory Visit: Payer: Self-pay | Admitting: Gerontology

## 2019-09-29 ENCOUNTER — Other Ambulatory Visit: Payer: Self-pay

## 2019-09-29 ENCOUNTER — Encounter: Payer: Self-pay | Admitting: Gerontology

## 2019-09-29 VITALS — BP 111/77 | HR 94 | Ht 64.0 in | Wt 207.0 lb

## 2019-09-29 DIAGNOSIS — Z8639 Personal history of other endocrine, nutritional and metabolic disease: Secondary | ICD-10-CM

## 2019-09-29 DIAGNOSIS — E039 Hypothyroidism, unspecified: Secondary | ICD-10-CM

## 2019-09-29 DIAGNOSIS — G250 Essential tremor: Secondary | ICD-10-CM

## 2019-09-29 DIAGNOSIS — I10 Essential (primary) hypertension: Secondary | ICD-10-CM

## 2019-09-29 DIAGNOSIS — R7303 Prediabetes: Secondary | ICD-10-CM

## 2019-09-29 DIAGNOSIS — Z8719 Personal history of other diseases of the digestive system: Secondary | ICD-10-CM

## 2019-09-29 MED ORDER — PRIMIDONE 50 MG PO TABS: ORAL_TABLET | ORAL | 1 refills | Status: DC

## 2019-09-29 MED ORDER — LEVOTHYROXINE SODIUM 88 MCG PO TABS: ORAL_TABLET | ORAL | 0 refills | Status: DC

## 2019-09-29 MED ORDER — METFORMIN HCL 500 MG PO TABS: ORAL_TABLET | ORAL | 0 refills | Status: DC

## 2019-09-29 MED ORDER — LISINOPRIL 5 MG PO TABS: 5.0000 mg | ORAL_TABLET | Freq: Every day | ORAL | 1 refills | Status: DC

## 2019-09-29 MED ORDER — ATORVASTATIN CALCIUM 40 MG PO TABS: 40.0000 mg | ORAL_TABLET | Freq: Every day | ORAL | 1 refills | Status: DC

## 2019-09-29 MED ORDER — OMEPRAZOLE 20 MG PO CPDR: 20.0000 mg | DELAYED_RELEASE_CAPSULE | Freq: Every day | ORAL | 1 refills | Status: DC

## 2019-09-29 NOTE — Progress Notes (Signed)
Established Patient Office Visit  Subjective:  Patient ID: Rebekah Salinas, female    DOB: Nov 12, 1955  Age: 64 y.o. MRN: [id number]  CC: No chief complaint on file.   HPI Rebekah Salinas presents for follow up of hypertension, hyperlipidemia, head tremor, Gerd, hypothyroidism and medication refill. She is compliant with her medications, adheres to healthy diet and exercises as tolerated. The biopsy to her left breast mass at  2 o'clock done on 06/13/2019 showed fibrocystic changes, benign calcifications and no atypia or malignancy per Dr Lovey Newcomer, and unilateral left breast diagnostic mammogram and possible ultrasound in April 2021 was recommended.  She denies chest pain, palpitation, lightheadedness, cold or heat intolerance, weakness, cough, myalgia, fever and chills.  Overall she states that she is doing well and offers no further complaint.  Past Medical History:  Diagnosis Date  . Diabetes mellitus without complication ([place])   . Hypertension   . Thyroid disease     Past Surgical History:  Procedure Laterality Date  . ABDOMINAL HYSTERECTOMY    . BREAST BIOPSY Left    neg  . BREAST BIOPSY Left    core bx- neg  . BREAST BIOPSY Left 06/13/2019   [place] bx 2:00 coil   . TUBAL LIGATION      Family History  Problem Relation Age of Onset  . CAD Sister   . Breast cancer Sister 56       twin sister  . Cancer Sister   . Cancer Maternal Grandmother     Social History   Socioeconomic History  . Marital status: Married    Spouse name: Not on file  . Number of children: Not on file  . Years of education: Not on file  . Highest education level: Not on file  Occupational History  . Not on file  Tobacco Use  . Smoking status: Never Smoker  . Smokeless tobacco: Never Used  Substance and Sexual Activity  . Alcohol use: Never  . Drug use: Never  . Sexual activity: Yes    Birth control/protection: None  Other Topics Concern  . Not on file  Social History Narrative  . Not on  file   Social Determinants of Health   Financial Resource Strain:   . Difficulty of Paying Living Expenses: Not on file  Food Insecurity:   . Worried About Charity fundraiser in the Last Year: Not on file  . Ran Out of Food in the Last Year: Not on file  Transportation Needs:   . Lack of Transportation (Medical): Not on file  . Lack of Transportation (Non-Medical): Not on file  Physical Activity:   . Days of Exercise per Week: Not on file  . Minutes of Exercise per Session: Not on file  Stress:   . Feeling of Stress : Not on file  Social Connections:   . Frequency of Communication with Friends and Family: Not on file  . Frequency of Social Gatherings with Friends and Family: Not on file  . Attends Religious Services: Not on file  . Active Member of Clubs or Organizations: Not on file  . Attends Archivist Meetings: Not on file  . Marital Status: Not on file  Intimate Partner Violence:   . Fear of Current or Ex-Partner: Not on file  . Emotionally Abused: Not on file  . Physically Abused: Not on file  . Sexually Abused: Not on file    Outpatient Medications Prior to Visit  Medication Sig Dispense Refill  . cyclobenzaprine (  FLEXERIL) 10 MG tablet Take 1 tablet (10 mg total) by mouth 2 (two) times daily. 60 tablet 3  . atorvastatin (LIPITOR) 40 MG tablet TAKE ONE TABLET BY MOUTH EVERY DAY 90 tablet 0  . levothyroxine (SYNTHROID) 88 MCG tablet TAKE ONE TABLET BY MOUTH EVERY DAY BEFORE BREAKFAST. 30 tablet 0  . lisinopril (ZESTRIL) 5 MG tablet TAKE ONE TABLET BY MOUTH EVERY DAY 90 tablet 0  . metFORMIN (GLUCOPHAGE) 500 MG tablet TAKE TWO TABLETS (1000MG ) BY MOUTH EVERY DAY WITH BREAKFAST. 120 tablet 0  . omeprazole (PRILOSEC) 20 MG capsule TAKE ONE CAPSULE BY MOUTH EVERY DAY 90 capsule 0  . primidone (MYSOLINE) 50 MG tablet TAKE ONE TABLET BY MOUTH 2 TIMES A DAY 120 tablet 0  . acetaminophen (TYLENOL) 325 MG tablet Take 650 mg by mouth every 6 (six) hours as needed.      No facility-administered medications prior to visit.    Allergies  Allergen Reactions  . Aspirin Nausea And Vomiting    ROS Review of Systems  Constitutional: Negative.   HENT: Negative.   Eyes: Negative.   Respiratory: Negative.   Cardiovascular: Negative.   Gastrointestinal: Negative.   Endocrine: Negative.   Skin: Negative.   Neurological: Positive for tremors (chronic head tremor).  Psychiatric/Behavioral: Negative.       Objective:    Physical Exam  Constitutional: She is oriented to person, place, and time. She appears well-developed and well-nourished.  HENT:  Head: Normocephalic and atraumatic.  Chronic mild tremor to head and voice  Eyes: Pupils are equal, round, and reactive to light. EOM are normal.  Cardiovascular: Normal rate and regular rhythm.  Pulmonary/Chest: Effort normal and breath sounds normal.  Neurological: She is alert and oriented to person, place, and time.  Skin: Skin is warm and dry.  Psychiatric: She has a normal mood and affect. Her behavior is normal. Judgment and thought content normal.    BP 111/77 (BP Location: Left Arm, Patient Position: Sitting)   Pulse 94   Ht 5\' 4"  (1.626 m)   Wt 207 lb (93.9 kg)   SpO2 96%   BMI 35.53 kg/m  Wt Readings from Last 3 Encounters:  09/29/19 207 lb (93.9 kg)  06/24/19 214 lb (97.1 kg)  06/23/19 216 lb (98 kg)   She lost 7 pounds in 3 months and she was encouraged to continue on weight loss regimen.  Health Maintenance Due  Topic Date Due  . Hepatitis C Screening  1956/07/14  . HIV Screening  03/05/1972  . TETANUS/TDAP  03/05/1976  . PAP SMEAR-Modifier  03/05/1978  . COLONOSCOPY  03/06/2007    There are no preventive care reminders to display for this patient.  Lab Results  Component Value Date   TSH 0.586 07/20/2019   Lab Results  Component Value Date   WBC 4.6 03/09/2019   HGB 12.9 03/09/2019   HCT 40.2 03/09/2019   MCV 87 03/09/2019   PLT 373 03/09/2019   Lab Results   Component Value Date   NA 140 03/09/2019   K 4.3 03/09/2019   CO2 25 03/09/2019   GLUCOSE 96 03/09/2019   BUN 13 03/09/2019   CREATININE 0.98 03/09/2019   BILITOT 0.4 03/09/2019   ALKPHOS 86 03/09/2019   AST 17 03/09/2019   ALT 24 03/09/2019   PROT 6.9 03/09/2019   ALBUMIN 4.3 03/09/2019   CALCIUM 9.0 03/09/2019   ANIONGAP 10 06/02/2016   Lab Results  Component Value Date   CHOL 154 03/09/2019  Lab Results  Component Value Date   HDL 40 03/09/2019   Lab Results  Component Value Date   LDLCALC 85 03/09/2019   Lab Results  Component Value Date   TRIG 144 03/09/2019   Lab Results  Component Value Date   CHOLHDL 3.9 03/09/2019   Lab Results  Component Value Date   HGBA1C 6.0 (H) 06/15/2019      Assessment & Plan:    1. History of elevated lipids -She will continue on current treatment regimen, was encouraged to continue on low-fat/low-cholesterol diet, and exercise as tolerated. - atorvastatin (LIPITOR) 40 MG tablet; Take 1 tablet (40 mg total) by mouth daily.  Dispense: 90 tablet; Refill: 1  2. Hypothyroidism, unspecified type -She is euthyroid and will continue on current treatment regimen. - levothyroxine (SYNTHROID) 88 MCG tablet; TAKE ONE TABLET BY MOUTH EVERY DAY BEFORE BREAKFAST.  Dispense: 30 tablet; Refill: 0 - TSH; Future  3. Essential hypertension -Her blood pressure is under control and she will continue on current treatment regimen and was advised to continue on DASH diet, and exercise as tolerated. - lisinopril (ZESTRIL) 5 MG tablet; Take 1 tablet (5 mg total) by mouth daily.  Dispense: 90 tablet; Refill: 1  4. Prediabetes -She will continue on current treatment regimen and was encouraged to continue on low-carb/no concentrated sweet diet, and exercise as tolerated. - metFORMIN (GLUCOPHAGE) 500 MG tablet; TAKE TWO TABLETS (1000MG ) BY MOUTH EVERY DAY WITH BREAKFAST.  Dispense: 120 tablet; Refill: 0 - HgB A1c; Future  5. History of  gastroesophageal reflux (GERD) -Her acid reflux is under control and she will continue on current treatment regimen. - omeprazole (PRILOSEC) 20 MG capsule; Take 1 capsule (20 mg total) by mouth daily.  Dispense: 90 capsule; Refill: 1  6. Benign head tremor -She states that her head tremor is manageable and will continue on current treatment regimen. - primidone (MYSOLINE) 50 MG tablet; TAKE ONE TABLET BY MOUTH 2 TIMES A DAY  Dispense: 120 tablet; Refill: 1    Follow-up: Return in about 4 months (around 01/18/2020), or if symptoms worsen or fail to improve.    Nyleah Mcginnis 01/20/2020, NP

## 2019-09-29 NOTE — Patient Instructions (Signed)
Fat and Cholesterol Restricted Eating Plan °Getting too much fat and cholesterol in your diet may cause health problems. Choosing the right foods helps keep your fat and cholesterol at normal levels. This can keep you from getting certain diseases. °Your doctor may recommend an eating plan that includes: °· Total fat: ______% or less of total calories a day. °· Saturated fat: ______% or less of total calories a day. °· Cholesterol: less than _________mg a day. °· Fiber: ______g a day. °What are tips for following this plan? °Meal planning °· At meals, divide your plate into four equal parts: °? Fill one-half of your plate with vegetables and green salads. °? Fill one-fourth of your plate with whole grains. °? Fill one-fourth of your plate with low-fat (lean) protein foods. °· Eat fish that is high in omega-3 fats at least two times a week. This includes mackerel, tuna, sardines, and salmon. °· Eat foods that are high in fiber, such as whole grains, beans, apples, broccoli, carrots, peas, and barley. °General tips ° °· Work with your doctor to lose weight if you need to. °· Avoid: °? Foods with added sugar. °? Fried foods. °? Foods with partially hydrogenated oils. °· Limit alcohol intake to no more than 1 drink a day for nonpregnant women and 2 drinks a day for men. One drink equals 12 oz of beer, 5 oz of wine, or 1½ oz of hard liquor. °Reading food labels °· Check food labels for: °? Trans fats. °? Partially hydrogenated oils. °? Saturated fat (g) in each serving. °? Cholesterol (mg) in each serving. °? Fiber (g) in each serving. °· Choose foods with healthy fats, such as: °? Monounsaturated fats. °? Polyunsaturated fats. °? Omega-3 fats. °· Choose grain products that have whole grains. Look for the word "whole" as the first word in the ingredient list. °Cooking °· Cook foods using low-fat methods. These include baking, boiling, grilling, and broiling. °· Eat more home-cooked foods. Eat at restaurants and buffets  less often. °· Avoid cooking using saturated fats, such as butter, cream, palm oil, palm kernel oil, and coconut oil. °Recommended foods ° °Fruits °· All fresh, canned (in natural juice), or frozen fruits. °Vegetables °· Fresh or frozen vegetables (raw, steamed, roasted, or grilled). Green salads. °Grains °· Whole grains, such as whole wheat or whole grain breads, crackers, cereals, and pasta. Unsweetened oatmeal, bulgur, barley, quinoa, or brown rice. Corn or whole wheat flour tortillas. °Meats and other protein foods °· Ground beef (85% or leaner), grass-fed beef, or beef trimmed of fat. Skinless chicken or turkey. Ground chicken or turkey. Pork trimmed of fat. All fish and seafood. Egg whites. Dried beans, peas, or lentils. Unsalted nuts or seeds. Unsalted canned beans. Nut butters without added sugar or oil. °Dairy °· Low-fat or nonfat dairy products, such as skim or 1% milk, 2% or reduced-fat cheeses, low-fat and fat-free ricotta or cottage cheese, or plain low-fat and nonfat yogurt. °Fats and oils °· Tub margarine without trans fats. Light or reduced-fat mayonnaise and salad dressings. Avocado. Olive, canola, sesame, or safflower oils. °The items listed above may not be a complete list of foods and beverages you can eat. Contact a dietitian for more information. °Foods to avoid °Fruits °· Canned fruit in heavy syrup. Fruit in cream or butter sauce. Fried fruit. °Vegetables °· Vegetables cooked in cheese, cream, or butter sauce. Fried vegetables. °Grains °· White bread. White pasta. White rice. Cornbread. Bagels, pastries, and croissants. Crackers and snack foods that contain trans fat   and hydrogenated oils. °Meats and other protein foods °· Fatty cuts of meat. Ribs, chicken wings, bacon, sausage, bologna, salami, chitterlings, fatback, hot dogs, bratwurst, and packaged lunch meats. Liver and organ meats. Whole eggs and egg yolks. Chicken and turkey with skin. Fried meat. °Dairy °· Whole or 2% milk, cream,  half-and-half, and cream cheese. Whole milk cheeses. Whole-fat or sweetened yogurt. Full-fat cheeses. Nondairy creamers and whipped toppings. Processed cheese, cheese spreads, and cheese curds. °Beverages °· Alcohol. Sugar-sweetened drinks such as sodas, lemonade, and fruit drinks. °Fats and oils °· Butter, stick margarine, lard, shortening, ghee, or bacon fat. Coconut, palm kernel, and palm oils. °Sweets and desserts °· Corn syrup, sugars, honey, and molasses. Candy. Jam and jelly. Syrup. Sweetened cereals. Cookies, pies, cakes, donuts, muffins, and ice cream. °The items listed above may not be a complete list of foods and beverages you should avoid. Contact a dietitian for more information. °Summary °· Choosing the right foods helps keep your fat and cholesterol at normal levels. This can keep you from getting certain diseases. °· At meals, fill one-half of your plate with vegetables and green salads. °· Eat high-fiber foods, like whole grains, beans, apples, carrots, peas, and barley. °· Limit added sugar, saturated fats, alcohol, and fried foods. °This information is not intended to replace advice given to you by your health care provider. Make sure you discuss any questions you have with your health care provider. °Document Revised: 05/12/2018 Document Reviewed: 05/26/2017 °Elsevier Patient Education © 2020 [employer] °Carbohydrate Counting for Diabetes Mellitus, Adult ° °Carbohydrate counting is a method of keeping track of how many carbohydrates you eat. Eating carbohydrates naturally increases the amount of sugar (glucose) in the blood. Counting how many carbohydrates you eat helps keep your blood glucose within normal limits, which helps you manage your diabetes (diabetes mellitus). °It is important to know how many carbohydrates you can safely have in each meal. This is different for every person. A diet and nutrition specialist (registered dietitian) can help you make a meal plan and calculate how many  carbohydrates you should have at each meal and snack. °Carbohydrates are found in the following foods: °· Grains, such as breads and cereals. °· Dried beans and soy products. °· Starchy vegetables, such as potatoes, peas, and corn. °· Fruit and fruit juices. °· Milk and yogurt. °· Sweets and snack foods, such as cake, cookies, candy, chips, and soft drinks. °How do I count carbohydrates? °There are two ways to count carbohydrates in food. You can use either of the methods or a combination of both. °Reading "Nutrition Facts" on packaged food °The "Nutrition Facts" list is included on the labels of almost all packaged foods and beverages in the [place] It includes: °· The serving size. °· Information about nutrients in each serving, including the grams (g) of carbohydrate per serving. °To use the “Nutrition Facts": °· Decide how many servings you will have. °· Multiply the number of servings by the number of carbohydrates per serving. °· The resulting number is the total amount of carbohydrates that you will be having. °Learning standard serving sizes of other foods °When you eat carbohydrate foods that are not packaged or do not include "Nutrition Facts" on the label, you need to measure the servings in order to count the amount of carbohydrates: °· Measure the foods that you will eat with a food scale or measuring cup, if needed. °· Decide how many standard-size servings you will eat. °· Multiply the number of servings by 15.   Most carbohydrate-rich foods have about 15 g of carbohydrates per serving. °? For example, if you eat 8 oz (170 g) of strawberries, you will have eaten 2 servings and 30 g of carbohydrates (2 servings x 15 g = 30 g). °· For foods that have more than one food mixed, such as soups and casseroles, you must count the carbohydrates in each food that is included. °The following list contains standard serving sizes of common carbohydrate-rich foods. Each of these servings has about 15 g of  carbohydrates: °· ½ hamburger bun or ½ English muffin. °· ½ oz (15 mL) syrup. °· ½ oz (14 g) jelly. °· 1 slice of bread. °· 1 six-inch tortilla. °· 3 oz (85 g) cooked rice or pasta. °· 4 oz (113 g) cooked dried beans. °· 4 oz (113 g) starchy vegetable, such as peas, corn, or potatoes. °· 4 oz (113 g) hot cereal. °· 4 oz (113 g) mashed potatoes or ¼ of a large baked potato. °· 4 oz (113 g) canned or frozen fruit. °· 4 oz (120 mL) fruit juice. °· 4-6 crackers. °· 6 chicken nuggets. °· 6 oz (170 g) unsweetened dry cereal. °· 6 oz (170 g) plain fat-free yogurt or yogurt sweetened with artificial sweeteners. °· 8 oz (240 mL) milk. °· 8 oz (170 g) fresh fruit or one small piece of fruit. °· 24 oz (680 g) popped popcorn. °Example of carbohydrate counting °Sample meal °· 3 oz (85 g) chicken breast. °· 6 oz (170 g) brown rice. °· 4 oz (113 g) corn. °· 8 oz (240 mL) milk. °· 8 oz (170 g) strawberries with sugar-free whipped topping. °Carbohydrate calculation °1. Identify the foods that contain carbohydrates: °? Rice. °? Corn. °? Milk. °? Strawberries. °2. Calculate how many servings you have of each food: °? 2 servings rice. °? 1 serving corn. °? 1 serving milk. °? 1 serving strawberries. °3. Multiply each number of servings by 15 g: °? 2 servings rice x 15 g = 30 g. °? 1 serving corn x 15 g = 15 g. °? 1 serving milk x 15 g = 15 g. °? 1 serving strawberries x 15 g = 15 g. °4. Add together all of the amounts to find the total grams of carbohydrates eaten: °? 30 g + 15 g + 15 g + 15 g = 75 g of carbohydrates total. °Summary °· Carbohydrate counting is a method of keeping track of how many carbohydrates you eat. °· Eating carbohydrates naturally increases the amount of sugar (glucose) in the blood. °· Counting how many carbohydrates you eat helps keep your blood glucose within normal limits, which helps you manage your diabetes. °· A diet and nutrition specialist (registered dietitian) can help you make a meal plan and calculate  how many carbohydrates you should have at each meal and snack. °This information is not intended to replace advice given to you by your health care provider. Make sure you discuss any questions you have with your health care provider. °Document Revised: 04/02/2017 Document Reviewed: 02/20/2016 °Elsevier Patient Education © 2020 [employer] °DASH Eating Plan °DASH stands for "Dietary Approaches to Stop Hypertension." The DASH eating plan is a healthy eating plan that has been shown to reduce high blood pressure (hypertension). It may also reduce your risk for type 2 diabetes, heart disease, and stroke. The DASH eating plan may also help with weight loss. °What are tips for following this plan? ° °General guidelines °· Avoid eating more than 2,300 mg (  milligrams) of salt (sodium) a day. If you have hypertension, you may need to reduce your sodium intake to 1,500 mg a day. °· Limit alcohol intake to no more than 1 drink a day for nonpregnant women and 2 drinks a day for men. One drink equals 12 oz of beer, 5 oz of wine, or 1½ oz of hard liquor. °· Work with your health care provider to maintain a healthy body weight or to lose weight. Ask what an ideal weight is for you. °· Get at least 30 minutes of exercise that causes your heart to beat faster (aerobic exercise) most days of the week. Activities may include walking, swimming, or biking. °· Work with your health care provider or diet and nutrition specialist (dietitian) to adjust your eating plan to your individual calorie needs. °Reading food labels ° °· Check food labels for the amount of sodium per serving. Choose foods with less than 5 percent of the Daily Value of sodium. Generally, foods with less than 300 mg of sodium per serving fit into this eating plan. °· To find whole grains, look for the word "whole" as the first word in the ingredient list. °Shopping °· Buy products labeled as "low-sodium" or "no salt added." °· Buy fresh foods. Avoid canned foods and  premade or frozen meals. °Cooking °· Avoid adding salt when cooking. Use salt-free seasonings or herbs instead of table salt or sea salt. Check with your health care provider or pharmacist before using salt substitutes. °· Do not fry foods. Cook foods using healthy methods such as baking, boiling, grilling, and broiling instead. °· Cook with heart-healthy oils, such as olive, canola, soybean, or sunflower oil. °Meal planning °· Eat a balanced diet that includes: °? 5 or more servings of fruits and vegetables each day. At each meal, try to fill half of your plate with fruits and vegetables. °? Up to 6-8 servings of whole grains each day. °? Less than 6 oz of lean meat, poultry, or fish each day. A 3-oz serving of meat is about the same size as a deck of cards. One egg equals 1 oz. °? 2 servings of low-fat dairy each day. °? A serving of nuts, seeds, or beans 5 times each week. °? Heart-healthy fats. Healthy fats called Omega-3 fatty acids are found in foods such as flaxseeds and coldwater fish, like sardines, salmon, and mackerel. °· Limit how much you eat of the following: °? Canned or prepackaged foods. °? Food that is high in trans fat, such as fried foods. °? Food that is high in saturated fat, such as fatty meat. °? Sweets, desserts, sugary drinks, and other foods with added sugar. °? Full-fat dairy products. °· Do not salt foods before eating. °· Try to eat at least 2 vegetarian meals each week. °· Eat more home-cooked food and less restaurant, buffet, and fast food. °· When eating at a restaurant, ask that your food be prepared with less salt or no salt, if possible. °What foods are recommended? °The items listed may not be a complete list. Talk with your dietitian about what dietary choices are best for you. °Grains °Whole-grain or whole-wheat bread. Whole-grain or whole-wheat pasta. Brown rice. Oatmeal. Quinoa. Bulgur. Whole-grain and low-sodium cereals. Pita bread. Low-fat, low-sodium crackers. Whole-wheat  flour tortillas. °Vegetables °Fresh or frozen vegetables (raw, steamed, roasted, or grilled). Low-sodium or reduced-sodium tomato and vegetable juice. Low-sodium or reduced-sodium tomato sauce and tomato paste. Low-sodium or reduced-sodium canned vegetables. °Fruits °All fresh, dried, or frozen fruit.   Canned fruit in natural juice (without added sugar). °Meat and other protein foods °Skinless chicken or turkey. Ground chicken or turkey. Pork with fat trimmed off. Fish and seafood. Egg whites. Dried beans, peas, or lentils. Unsalted nuts, nut butters, and seeds. Unsalted canned beans. Lean cuts of beef with fat trimmed off. Low-sodium, lean deli meat. °Dairy °Low-fat (1%) or fat-free (skim) milk. Fat-free, low-fat, or reduced-fat cheeses. Nonfat, low-sodium ricotta or cottage cheese. Low-fat or nonfat yogurt. Low-fat, low-sodium cheese. °Fats and oils °Soft margarine without trans fats. Vegetable oil. Low-fat, reduced-fat, or light mayonnaise and salad dressings (reduced-sodium). Canola, safflower, olive, soybean, and sunflower oils. Avocado. °Seasoning and other foods °Herbs. Spices. Seasoning mixes without salt. Unsalted popcorn and pretzels. Fat-free sweets. °What foods are not recommended? °The items listed may not be a complete list. Talk with your dietitian about what dietary choices are best for you. °Grains °Baked goods made with fat, such as croissants, muffins, or some breads. Dry pasta or rice meal packs. °Vegetables °Creamed or fried vegetables. Vegetables in a cheese sauce. Regular canned vegetables (not low-sodium or reduced-sodium). Regular canned tomato sauce and paste (not low-sodium or reduced-sodium). Regular tomato and vegetable juice (not low-sodium or reduced-sodium). Pickles. Olives. °Fruits °Canned fruit in a light or heavy syrup. Fried fruit. Fruit in cream or butter sauce. °Meat and other protein foods °Fatty cuts of meat. Ribs. Fried meat. Bacon. Sausage. Bologna and other processed lunch  meats. Salami. Fatback. Hotdogs. Bratwurst. Salted nuts and seeds. Canned beans with added salt. Canned or smoked fish. Whole eggs or egg yolks. Chicken or turkey with skin. °Dairy °Whole or 2% milk, cream, and half-and-half. Whole or full-fat cream cheese. Whole-fat or sweetened yogurt. Full-fat cheese. Nondairy creamers. Whipped toppings. Processed cheese and cheese spreads. °Fats and oils °Butter. Stick margarine. Lard. Shortening. Ghee. Bacon fat. Tropical oils, such as coconut, palm kernel, or palm oil. °Seasoning and other foods °Salted popcorn and pretzels. Onion salt, garlic salt, seasoned salt, table salt, and sea salt. Worcestershire sauce. Tartar sauce. Barbecue sauce. Teriyaki sauce. Soy sauce, including reduced-sodium. Steak sauce. Canned and packaged gravies. Fish sauce. Oyster sauce. Cocktail sauce. Horseradish that you find on the shelf. Ketchup. Mustard. Meat flavorings and tenderizers. Bouillon cubes. Hot sauce and Tabasco sauce. Premade or packaged marinades. Premade or packaged taco seasonings. Relishes. Regular salad dressings. °Where to find more information: °· National Heart, Lung, and Blood Institute: www.nhlbi.nih.gov °· American Heart Association: www.heart.org °Summary °· The DASH eating plan is a healthy eating plan that has been shown to reduce high blood pressure (hypertension). It may also reduce your risk for type 2 diabetes, heart disease, and stroke. °· With the DASH eating plan, you should limit salt (sodium) intake to 2,300 mg a day. If you have hypertension, you may need to reduce your sodium intake to 1,500 mg a day. °· When on the DASH eating plan, aim to eat more fresh fruits and vegetables, whole grains, lean proteins, low-fat dairy, and heart-healthy fats. °· Work with your health care provider or diet and nutrition specialist (dietitian) to adjust your eating plan to your individual calorie needs. °This information is not intended to replace advice given to you by your  health care provider. Make sure you discuss any questions you have with your health care provider. °Document Revised: 08/21/2017 Document Reviewed: 09/01/2016 °Elsevier Patient Education © 2020 Elsevier Inc. ° °

## 2019-10-03 ENCOUNTER — Other Ambulatory Visit: Payer: Self-pay

## 2019-10-03 ENCOUNTER — Encounter: Payer: Self-pay | Admitting: Emergency Medicine

## 2019-10-03 ENCOUNTER — Emergency Department
Admission: EM | Admit: 2019-10-03 | Discharge: 2019-10-03 | Disposition: A | Discharge status: Home / Self Care | Payer: Self-pay | Attending: Emergency Medicine | Admitting: Emergency Medicine

## 2019-10-03 DIAGNOSIS — Z7984 Long term (current) use of oral hypoglycemic drugs: Secondary | ICD-10-CM | POA: Diagnosis not present

## 2019-10-03 DIAGNOSIS — I10 Essential (primary) hypertension: Secondary | ICD-10-CM | POA: Diagnosis not present

## 2019-10-03 DIAGNOSIS — E119 Type 2 diabetes mellitus without complications: Secondary | ICD-10-CM | POA: Diagnosis not present

## 2019-10-03 DIAGNOSIS — Z79899 Other long term (current) drug therapy: Secondary | ICD-10-CM | POA: Diagnosis not present

## 2019-10-03 DIAGNOSIS — E039 Hypothyroidism, unspecified: Secondary | ICD-10-CM | POA: Insufficient documentation

## 2019-10-03 DIAGNOSIS — U071 COVID-19: Secondary | ICD-10-CM | POA: Insufficient documentation

## 2019-10-03 DIAGNOSIS — R52 Pain, unspecified: Secondary | ICD-10-CM | POA: Diagnosis present

## 2019-10-03 MED ORDER — ACETAMINOPHEN 325 MG PO TABS
650.0000 mg | ORAL_TABLET | Freq: Once | ORAL | Status: AC
  Administered 2019-10-03: 16:00:00 650 mg via ORAL
  Filled 2019-10-03: qty 2

## 2019-10-03 NOTE — ED Triage Notes (Signed)
Pt comes into the ED via EMS from home with c/o weakness , fever, covid exposure while at church about 6 days ago, husband is here with same sx. 117/68, HR87, 97& on RA, [id number]

## 2019-10-03 NOTE — ED Provider Notes (Signed)
[facility] Emergency Department Provider Note   ____________________________________________    I have reviewed the triage vital signs and the nursing notes.   HISTORY  Chief Complaint Generalized Body Aches     HPI Rebekah Salinas is a 64 y.o. female with a history of diabetes, hypertension who presents with generalized body aches, fever, loss of taste and smell.  She describes some nausea as well.  Did have Covid exposure at church earlier a week ago.  Husband has similar symptoms.  She denies shortness of breath, positive cough.  Does not take anything for this.  Past Medical History:  Diagnosis Date  . Diabetes mellitus without complication ([place])   . Hypertension   . Thyroid disease     Patient Active Problem List   Diagnosis Date Noted  . Prediabetes 03/15/2019  . Hypothyroidism 03/15/2019  . Essential hypertension 03/15/2019  . History of gastroesophageal reflux (GERD) 03/15/2019  . Benign head tremor 03/15/2019  . Mass of upper outer quadrant of left breast 03/15/2019  . Health care maintenance 03/15/2019  . Chest pain 06/02/2016    Past Surgical History:  Procedure Laterality Date  . ABDOMINAL HYSTERECTOMY    . BREAST BIOPSY Left    neg  . BREAST BIOPSY Left    core bx- neg  . BREAST BIOPSY Left 06/13/2019   [place] bx 2:00 coil   . TUBAL LIGATION      Prior to Admission medications   Medication Sig Start Date End Date Taking? Authorizing Provider  acetaminophen (TYLENOL) 325 MG tablet Take 650 mg by mouth every 6 (six) hours as needed.    [provider]  atorvastatin (LIPITOR) 40 MG tablet Take 1 tablet (40 mg total) by mouth daily. 09/29/19   Iloabachie, Chioma E, NP  cyclobenzaprine (FLEXERIL) 10 MG tablet Take 1 tablet (10 mg total) by mouth 2 (two) times daily. 03/15/19   Iloabachie, Chioma E, NP  levothyroxine (SYNTHROID) 88 MCG tablet TAKE ONE TABLET BY MOUTH EVERY DAY BEFORE BREAKFAST. 09/29/19   Iloabachie, Chioma  E, NP  lisinopril (ZESTRIL) 5 MG tablet Take 1 tablet (5 mg total) by mouth daily. 09/29/19   Iloabachie, Chioma E, NP  metFORMIN (GLUCOPHAGE) 500 MG tablet TAKE TWO TABLETS (1000MG ) BY MOUTH EVERY DAY WITH BREAKFAST. 09/29/19   Iloabachie, Chioma E, NP  omeprazole (PRILOSEC) 20 MG capsule Take 1 capsule (20 mg total) by mouth daily. 09/29/19   Iloabachie, Chioma E, NP  primidone (MYSOLINE) 50 MG tablet TAKE ONE TABLET BY MOUTH 2 TIMES A DAY 09/29/19   Iloabachie, Chioma E, NP     Allergies Aspirin  Family History  Problem Relation Age of Onset  . CAD Sister   . Breast cancer Sister 50       twin sister  . Cancer Sister   . Cancer Maternal Grandmother     Social History Social History   Tobacco Use  . Smoking status: Never Smoker  . Smokeless tobacco: Never Used  Substance Use Topics  . Alcohol use: Never  . Drug use: Never    Review of Systems  Constitutional: As above Eyes: No visual changes.  ENT: No sore throat. Cardiovascular: Denies chest pain. Respiratory: No shortness of breath, positive cough Gastrointestinal: No abdominal pain.   Genitourinary: Negative for dysuria. Musculoskeletal: Positive myalgias Skin: Negative for rash. Neurological: Positive headache   ____________________________________________   PHYSICAL EXAM:  VITAL SIGNS: ED Triage Vitals  Enc Vitals Group     BP 10/03/19 1604  120/64     Pulse Rate 10/03/19 1604 99     Resp 10/03/19 1604 20     Temp 10/03/19 1604 (!) 102.1 F (38.9 C)     Temp src --      SpO2 10/03/19 1604 97 %     Weight 10/03/19 1542 93.9 kg (207 lb)     Height 10/03/19 1542 1.702 m (5\' 7" )     Head Circumference --      Peak Flow --      Pain Score 10/03/19 1542 7     Pain Loc --      Pain Edu? --      Excl. in GC? --     Constitutional: Alert and oriented.  Nose: No congestion/rhinnorhea.   Cardiovascular: Normal rate, regular rhythm.  Good peripheral circulation. Respiratory: Normal respiratory effort.  No  retractions.   Musculoskeletal: No lower extremity tenderness nor edema.  Warm and well perfused Neurologic:  Normal speech and language. No gross focal neurologic deficits are appreciated.  Skin:  Skin is warm, dry and intact. No rash noted. Psychiatric: Mood and affect are normal. Speech and behavior are normal.  ____________________________________________   LABS (all labs ordered are listed, but only abnormal results are displayed)  Labs Reviewed  NOVEL CORONAVIRUS, NAA (HOSP ORDER, SEND-OUT TO REF LAB; TAT 18-24 HRS)   ____________________________________________  EKG None ____________________________________________  RADIOLOGY  None ____________________________________________   PROCEDURES  Procedure(s) performed: No  Procedures   Critical Care performed: No ____________________________________________   INITIAL IMPRESSION / ASSESSMENT AND PLAN / ED COURSE  Pertinent labs & imaging results that were available during my care of the patient were reviewed by me and considered in my medical decision making (see chart for details).  Patient presentation consistent with COVID-19 given fever, fatigue, headache, loss of taste and smell.  We will send confirmatory PCR.  Likely patient's vitals are overall reassuring, oxygen saturation is normal.  Recommend supportive care at this time, strict return precautions discussed    ____________________________________________   FINAL CLINICAL IMPRESSION(S) / ED DIAGNOSES  Final diagnoses:  Clinical diagnosis of COVID-19        Note:  This document was prepared using Dragon voice recognition software and may include unintentional dictation errors.   12/01/19, MD 10/03/19 [phone]

## 2019-10-03 NOTE — ED Notes (Signed)
Daughter called to notify that pt was ready to be picked up.

## 2019-10-05 ENCOUNTER — Other Ambulatory Visit: Payer: Self-pay

## 2019-10-06 ENCOUNTER — Telehealth: Payer: Self-pay | Admitting: Nurse Practitioner

## 2019-10-06 ENCOUNTER — Telehealth: Payer: Self-pay | Admitting: Emergency Medicine

## 2019-10-06 LAB — NOVEL CORONAVIRUS, NAA (HOSP ORDER, SEND-OUT TO REF LAB; TAT 18-24 HRS): SARS-CoV-2, NAA: DETECTED — AB

## 2019-10-06 NOTE — ED Notes (Signed)
Called patient and notified her of positive COVID result.  Understanding verbalized.

## 2019-10-06 NOTE — Telephone Encounter (Signed)
  I connected by phone with Rebekah Salinas on 10/06/2019 at 8:11 PM to discuss the potential use of an new treatment for mild to moderate COVID-19 viral infection in non-hospitalized patients.  This patient is a 64 y.o. female that meets the FDA criteria for Emergency Use Authorization of bamlanivimab or casirivimab\imdevimab.  Has a (+) direct SARS-CoV-2 viral test result  Has mild or moderate COVID-19   Is ? 63 years of age and weighs ? 40 kg  Is NOT hospitalized due to COVID-19  Is NOT requiring oxygen therapy or requiring an increase in baseline oxygen flow rate due to COVID-19  Is within 10 days of symptom onset  Has at least one of the high risk factor(s) for progression to severe COVID-19 and/or hospitalization as defined in EUA.  Specific high risk criteria : Diabetes  I have spoken and communicated the following to the patient or parent/caregiver:  1. FDA has authorized the emergency use of bamlanivimab and casirivimab\imdevimab for the treatment of mild to moderate COVID-19 in adults and pediatric patients with positive results of direct SARS-CoV-2 viral testing who are 35 years of age and older weighing at least 40 kg, and who are at high risk for progressing to severe COVID-19 and/or hospitalization.  2. The significant known and potential risks and benefits of bamlanivimab and casirivimab\imdevimab, and the extent to which such potential risks and benefits are unknown.  3. Information on available alternative treatments and the risks and benefits of those alternatives, including clinical trials.  4. Patients treated with bamlanivimab and casirivimab\imdevimab should continue to self-isolate and use infection control measures (e.g., wear mask, isolate, social distance, avoid sharing personal items, clean and disinfect "high touch" surfaces, and frequent handwashing) according to CDC guidelines.   5. The patient or parent/caregiver has the option to accept or refuse  bamlanivimab or casirivimab\imdevimab .  After reviewing this information with the patient, The patient agreed to proceed with receiving the bamlanimivab infusion and will be provided a copy of the Fact sheet prior to receiving the infusion..  Patient's symptom onset was 09/29/2019. Currently no appointments are available and at her request I have placed her on the cancellation list.   [name] 10/06/2019 8:11 PM

## 2019-10-06 NOTE — Telephone Encounter (Signed)
Called patient to inform of covid positive.  Says she is isolating. Says husband is admitted in [place] for same.

## 2019-10-09 ENCOUNTER — Emergency Department: Payer: HRSA Program

## 2019-10-09 ENCOUNTER — Inpatient Hospital Stay: Payer: HRSA Program

## 2019-10-09 ENCOUNTER — Inpatient Hospital Stay
Admission: EM | Admit: 2019-10-09 | Discharge: 2019-10-13 | DRG: 178 | Disposition: A | Discharge status: Home Health | Payer: HRSA Program | Attending: Internal Medicine | Admitting: Internal Medicine

## 2019-10-09 ENCOUNTER — Encounter: Payer: Self-pay | Admitting: Intensive Care

## 2019-10-09 ENCOUNTER — Other Ambulatory Visit: Payer: Self-pay

## 2019-10-09 DIAGNOSIS — I10 Essential (primary) hypertension: Secondary | ICD-10-CM | POA: Diagnosis present

## 2019-10-09 DIAGNOSIS — E119 Type 2 diabetes mellitus without complications: Secondary | ICD-10-CM | POA: Diagnosis present

## 2019-10-09 DIAGNOSIS — Z79899 Other long term (current) drug therapy: Secondary | ICD-10-CM

## 2019-10-09 DIAGNOSIS — R0902 Hypoxemia: Secondary | ICD-10-CM | POA: Diagnosis present

## 2019-10-09 DIAGNOSIS — E039 Hypothyroidism, unspecified: Secondary | ICD-10-CM | POA: Diagnosis present

## 2019-10-09 DIAGNOSIS — R7303 Prediabetes: Secondary | ICD-10-CM | POA: Diagnosis not present

## 2019-10-09 DIAGNOSIS — K219 Gastro-esophageal reflux disease without esophagitis: Secondary | ICD-10-CM | POA: Diagnosis present

## 2019-10-09 DIAGNOSIS — Z7984 Long term (current) use of oral hypoglycemic drugs: Secondary | ICD-10-CM | POA: Diagnosis not present

## 2019-10-09 DIAGNOSIS — Z8719 Personal history of other diseases of the digestive system: Secondary | ICD-10-CM

## 2019-10-09 DIAGNOSIS — E785 Hyperlipidemia, unspecified: Secondary | ICD-10-CM | POA: Diagnosis present

## 2019-10-09 DIAGNOSIS — Z79890 Hormone replacement therapy (postmenopausal): Secondary | ICD-10-CM

## 2019-10-09 DIAGNOSIS — U071 COVID-19: Secondary | ICD-10-CM

## 2019-10-09 DIAGNOSIS — Z886 Allergy status to analgesic agent status: Secondary | ICD-10-CM | POA: Diagnosis not present

## 2019-10-09 DIAGNOSIS — J069 Acute upper respiratory infection, unspecified: Secondary | ICD-10-CM | POA: Diagnosis present

## 2019-10-09 DIAGNOSIS — R519 Headache, unspecified: Secondary | ICD-10-CM | POA: Diagnosis not present

## 2019-10-09 DIAGNOSIS — R079 Chest pain, unspecified: Secondary | ICD-10-CM | POA: Diagnosis present

## 2019-10-09 DIAGNOSIS — R072 Precordial pain: Secondary | ICD-10-CM | POA: Diagnosis not present

## 2019-10-09 DIAGNOSIS — K625 Hemorrhage of anus and rectum: Secondary | ICD-10-CM | POA: Diagnosis present

## 2019-10-09 HISTORY — DX: COVID-19: U07.1

## 2019-10-09 LAB — LACTATE DEHYDROGENASE: LDH: 468 U/L — ABNORMAL HIGH (ref 98–192)

## 2019-10-09 LAB — BASIC METABOLIC PANEL
Anion gap: 10 (ref 5–15)
BUN: 11 mg/dL (ref 8–23)
CO2: 26 mmol/L (ref 22–32)
Calcium: 8.4 mg/dL — ABNORMAL LOW (ref 8.9–10.3)
Chloride: 98 mmol/L (ref 98–111)
Creatinine, Ser: 1.04 mg/dL — ABNORMAL HIGH (ref 0.44–1.00)
GFR calc Af Amer: >60 mL/min (ref >60)
GFR calc non Af Amer: 58 mL/min — ABNORMAL LOW (ref >60)
Glucose, Bld: 112 mg/dL — ABNORMAL HIGH (ref 70–99)
Potassium: 3.6 mmol/L (ref 3.5–5.1)
Sodium: 134 mmol/L — ABNORMAL LOW (ref 135–145)

## 2019-10-09 LAB — CBC WITH DIFFERENTIAL/PLATELET
Abs Immature Granulocytes: 0.04 10*3/uL (ref 0.00–0.07)
Basophils Absolute: 0 10*3/uL (ref 0.0–0.1)
Basophils Relative: 0 %
Eosinophils Absolute: 0 10*3/uL (ref 0.0–0.5)
Eosinophils Relative: 0 %
HCT: 37.5 % (ref 36.0–46.0)
Hemoglobin: 12.2 g/dL (ref 12.0–15.0)
Immature Granulocytes: 1 %
Lymphocytes Relative: 11 %
Lymphs Abs: 0.9 10*3/uL (ref 0.7–4.0)
MCH: 27.7 pg (ref 26.0–34.0)
MCHC: 32.5 g/dL (ref 30.0–36.0)
MCV: 85 fL (ref 80.0–100.0)
Monocytes Absolute: 0.3 10*3/uL (ref 0.1–1.0)
Monocytes Relative: 4 %
Neutro Abs: 6.9 10*3/uL (ref 1.7–7.7)
Neutrophils Relative %: 84 %
Platelets: 291 10*3/uL (ref 150–400)
RBC: 4.41 MIL/uL (ref 3.87–5.11)
RDW: 13.8 % (ref 11.5–15.5)
WBC: 8.1 10*3/uL (ref 4.0–10.5)
nRBC: 0 % (ref 0.0–0.2)

## 2019-10-09 LAB — CBC
HCT: 37.5 % (ref 36.0–46.0)
HCT: 40 % (ref 36.0–46.0)
Hemoglobin: 12.1 g/dL (ref 12.0–15.0)
Hemoglobin: 13 g/dL (ref 12.0–15.0)
MCH: 27.6 pg (ref 26.0–34.0)
MCH: 27.6 pg (ref 26.0–34.0)
MCHC: 32.3 g/dL (ref 30.0–36.0)
MCHC: 32.5 g/dL (ref 30.0–36.0)
MCV: 85.6 fL (ref 80.0–100.0)
MCV: 84.9 fL (ref 80.0–100.0)
Platelets: 324 10*3/uL (ref 150–400)
Platelets: 342 10*3/uL (ref 150–400)
RBC: 4.38 MIL/uL (ref 3.87–5.11)
RBC: 4.71 MIL/uL (ref 3.87–5.11)
RDW: 14 % (ref 11.5–15.5)
RDW: 13.9 % (ref 11.5–15.5)
WBC: 9.1 10*3/uL (ref 4.0–10.5)
WBC: 9 10*3/uL (ref 4.0–10.5)
nRBC: 0 % (ref 0.0–0.2)
nRBC: 0 % (ref 0.0–0.2)

## 2019-10-09 LAB — BRAIN NATRIURETIC PEPTIDE: B Natriuretic Peptide: 34 pg/mL (ref 0.0–100.0)

## 2019-10-09 LAB — TROPONIN I (HIGH SENSITIVITY)
Troponin I (High Sensitivity): 4 ng/L (ref <18)
Troponin I (High Sensitivity): 3 ng/L (ref <18)
Troponin I (High Sensitivity): 3 ng/L (ref <18)
Troponin I (High Sensitivity): 4 ng/L (ref <18)

## 2019-10-09 LAB — HEPATIC FUNCTION PANEL
ALT: 25 U/L (ref 0–44)
AST: 40 U/L (ref 15–41)
Albumin: 3.2 g/dL — ABNORMAL LOW (ref 3.5–5.0)
Alkaline Phosphatase: 53 U/L (ref 38–126)
Bilirubin, Direct: 0.3 mg/dL — ABNORMAL HIGH (ref 0.0–0.2)
Indirect Bilirubin: 0.6 mg/dL (ref 0.3–0.9)
Total Bilirubin: 0.9 mg/dL (ref 0.3–1.2)
Total Protein: 6.7 g/dL (ref 6.5–8.1)

## 2019-10-09 LAB — FIBRINOGEN: Fibrinogen: 587 mg/dL — ABNORMAL HIGH (ref 210–475)

## 2019-10-09 LAB — TYPE AND SCREEN
ABO/RH(D): O POS
Antibody Screen: NEGATIVE

## 2019-10-09 LAB — FERRITIN: Ferritin: 518 ng/mL — ABNORMAL HIGH (ref 11–307)

## 2019-10-09 LAB — PROCALCITONIN: Procalcitonin: <0.1 ng/mL

## 2019-10-09 LAB — HIV ANTIBODY (ROUTINE TESTING W REFLEX): HIV Screen 4th Generation wRfx: NONREACTIVE

## 2019-10-09 LAB — TRIGLYCERIDES: Triglycerides: 72 mg/dL (ref <150)

## 2019-10-09 LAB — FIBRIN DERIVATIVES D-DIMER (ARMC ONLY): Fibrin derivatives D-dimer (ARMC): 739.14 ng/mL (FEU) — ABNORMAL HIGH (ref 0.00–499.00)

## 2019-10-09 LAB — C-REACTIVE PROTEIN: CRP: 13.4 mg/dL — ABNORMAL HIGH (ref <1.0)

## 2019-10-09 LAB — HEPATITIS B SURFACE ANTIGEN: Hepatitis B Surface Ag: NONREACTIVE

## 2019-10-09 MED ORDER — IPRATROPIUM BROMIDE HFA 17 MCG/ACT IN AERS
2.0000 | INHALATION_SPRAY | RESPIRATORY_TRACT | Status: DC
  Administered 2019-10-09 – 2019-10-13 (×22): 2 via RESPIRATORY_TRACT
  Filled 2019-10-09: qty 12.9

## 2019-10-09 MED ORDER — DEXAMETHASONE SODIUM PHOSPHATE 10 MG/ML IJ SOLN
6.0000 mg | Freq: Once | INTRAMUSCULAR | Status: AC
  Administered 2019-10-09: 10:00:00 6 mg via INTRAVENOUS
  Filled 2019-10-09: qty 1

## 2019-10-09 MED ORDER — SODIUM CHLORIDE 0.9 % IV SOLN
200.0000 mg | Freq: Once | INTRAVENOUS | Status: AC
  Administered 2019-10-09: 200 mg via INTRAVENOUS
  Filled 2019-10-09: qty 200

## 2019-10-09 MED ORDER — LEVOTHYROXINE SODIUM 88 MCG PO TABS
88.0000 ug | ORAL_TABLET | Freq: Every day | ORAL | Status: DC
  Administered 2019-10-10 – 2019-10-13 (×4): 88 ug via ORAL
  Filled 2019-10-09 (×4): qty 1

## 2019-10-09 MED ORDER — ALBUTEROL SULFATE HFA 108 (90 BASE) MCG/ACT IN AERS
2.0000 | INHALATION_SPRAY | RESPIRATORY_TRACT | Status: DC | PRN
  Administered 2019-10-09: 2 via RESPIRATORY_TRACT
  Filled 2019-10-09: qty 6.7

## 2019-10-09 MED ORDER — CYCLOBENZAPRINE HCL 10 MG PO TABS
10.0000 mg | ORAL_TABLET | Freq: Two times a day (BID) | ORAL | Status: DC
  Administered 2019-10-09 – 2019-10-13 (×8): 10 mg via ORAL
  Filled 2019-10-09 (×8): qty 1

## 2019-10-09 MED ORDER — PANTOPRAZOLE SODIUM 40 MG PO TBEC
40.0000 mg | DELAYED_RELEASE_TABLET | Freq: Every day | ORAL | Status: DC
  Administered 2019-10-09 – 2019-10-13 (×5): 40 mg via ORAL
  Filled 2019-10-09 (×5): qty 1

## 2019-10-09 MED ORDER — ASCORBIC ACID 500 MG PO TABS
500.0000 mg | ORAL_TABLET | Freq: Every day | ORAL | Status: DC
  Administered 2019-10-09 – 2019-10-13 (×5): 500 mg via ORAL
  Filled 2019-10-09 (×5): qty 1

## 2019-10-09 MED ORDER — LISINOPRIL 5 MG PO TABS
5.0000 mg | ORAL_TABLET | Freq: Every day | ORAL | Status: DC
  Administered 2019-10-09 – 2019-10-13 (×4): 5 mg via ORAL
  Filled 2019-10-09 (×5): qty 1

## 2019-10-09 MED ORDER — METHYLPREDNISOLONE SODIUM SUCC 40 MG IJ SOLR
40.0000 mg | Freq: Two times a day (BID) | INTRAMUSCULAR | Status: DC
  Administered 2019-10-09 – 2019-10-13 (×8): 40 mg via INTRAVENOUS
  Filled 2019-10-09 (×8): qty 1

## 2019-10-09 MED ORDER — ATORVASTATIN CALCIUM 20 MG PO TABS
40.0000 mg | ORAL_TABLET | Freq: Every day | ORAL | Status: DC
  Administered 2019-10-09 – 2019-10-13 (×5): 40 mg via ORAL
  Filled 2019-10-09 (×5): qty 2

## 2019-10-09 MED ORDER — SODIUM CHLORIDE 0.9 % IV SOLN
100.0000 mg | Freq: Every day | INTRAVENOUS | Status: AC
  Administered 2019-10-10 – 2019-10-13 (×4): 100 mg via INTRAVENOUS
  Filled 2019-10-09 (×5): qty 20

## 2019-10-09 MED ORDER — PRIMIDONE 50 MG PO TABS
50.0000 mg | ORAL_TABLET | Freq: Two times a day (BID) | ORAL | Status: DC
  Administered 2019-10-09 – 2019-10-13 (×8): 50 mg via ORAL
  Filled 2019-10-09 (×9): qty 1

## 2019-10-09 MED ORDER — IOHEXOL 350 MG/ML SOLN
75.0000 mL | Freq: Once | INTRAVENOUS | Status: AC | PRN
  Administered 2019-10-09: 75 mL via INTRAVENOUS

## 2019-10-09 MED ORDER — DM-GUAIFENESIN ER 30-600 MG PO TB12
1.0000 | ORAL_TABLET | Freq: Two times a day (BID) | ORAL | Status: DC
  Administered 2019-10-09 – 2019-10-13 (×9): 1 via ORAL
  Filled 2019-10-09 (×10): qty 1

## 2019-10-09 MED ORDER — ZINC SULFATE 220 (50 ZN) MG PO CAPS
220.0000 mg | ORAL_CAPSULE | Freq: Every day | ORAL | Status: DC
  Administered 2019-10-09 – 2019-10-13 (×5): 220 mg via ORAL
  Filled 2019-10-09 (×5): qty 1

## 2019-10-09 MED ORDER — ACETAMINOPHEN 325 MG PO TABS
650.0000 mg | ORAL_TABLET | Freq: Four times a day (QID) | ORAL | Status: DC | PRN
  Administered 2019-10-10 – 2019-10-12 (×6): 650 mg via ORAL
  Filled 2019-10-09 (×7): qty 2

## 2019-10-09 MED ORDER — HYDRALAZINE HCL 50 MG PO TABS: 25.0000 mg | ORAL_TABLET | Freq: Three times a day (TID) | ORAL | Status: DC | PRN

## 2019-10-09 MED ORDER — ONDANSETRON HCL 4 MG/2ML IJ SOLN: 4.0000 mg | Freq: Three times a day (TID) | INTRAMUSCULAR | Status: DC | PRN

## 2019-10-09 NOTE — ED Provider Notes (Signed)
[facility] Emergency Department Provider Note    ____________________________________________   I have reviewed the triage vital signs and the nursing notes.   HISTORY  Chief Complaint Rectal bleeding  History limited by: Not Limited   HPI Rebekah Salinas is a 64 y.o. female who presents to the emergency department today with chief complaint of rectal bleeding. The patient states that she first started having bleeding a couple of days ago. The bleeding was bright red. It did get better however has restarted today. The patient states that she has a history hemorrhoids but denies any recent straining or prolonged time on the toilet. The patient was recently diagnosed with covid. States that she has noticed shortness of breath and feels like it has gotten worse since she was seen in the emergency department.    Records reviewed. Per medical record review patient has a history of recent ER visit with covid positive result.   Past Medical History:  Diagnosis Date  . Diabetes mellitus without complication (HCC)   . Hypertension   . Thyroid disease     Patient Active Problem List   Diagnosis Date Noted  . Prediabetes 03/15/2019  . Hypothyroidism 03/15/2019  . Essential hypertension 03/15/2019  . History of gastroesophageal reflux (GERD) 03/15/2019  . Benign head tremor 03/15/2019  . Mass of upper outer quadrant of left breast 03/15/2019  . Health care maintenance 03/15/2019  . Chest pain 06/02/2016    Past Surgical History:  Procedure Laterality Date  . ABDOMINAL HYSTERECTOMY    . BREAST BIOPSY Left    neg  . BREAST BIOPSY Left    core bx- neg  . BREAST BIOPSY Left 06/13/2019   [place] bx 2:00 coil   . TUBAL LIGATION      Prior to Admission medications   Medication Sig Start Date End Date Taking? Authorizing Provider  acetaminophen (TYLENOL) 325 MG tablet Take 650 mg by mouth every 6 (six) hours as needed.    [provider]  atorvastatin  (LIPITOR) 40 MG tablet Take 1 tablet (40 mg total) by mouth daily. 09/29/19   Iloabachie, Chioma E, NP  cyclobenzaprine (FLEXERIL) 10 MG tablet Take 1 tablet (10 mg total) by mouth 2 (two) times daily. 03/15/19   Iloabachie, Chioma E, NP  levothyroxine (SYNTHROID) 88 MCG tablet TAKE ONE TABLET BY MOUTH EVERY DAY BEFORE BREAKFAST. 09/29/19   Iloabachie, Chioma E, NP  lisinopril (ZESTRIL) 5 MG tablet Take 1 tablet (5 mg total) by mouth daily. 09/29/19   Iloabachie, Chioma E, NP  metFORMIN (GLUCOPHAGE) 500 MG tablet TAKE TWO TABLETS (1000MG ) BY MOUTH EVERY DAY WITH BREAKFAST. 09/29/19   Iloabachie, Chioma E, NP  omeprazole (PRILOSEC) 20 MG capsule Take 1 capsule (20 mg total) by mouth daily. 09/29/19   Iloabachie, Chioma E, NP  primidone (MYSOLINE) 50 MG tablet TAKE ONE TABLET BY MOUTH 2 TIMES A DAY 09/29/19   Iloabachie, Chioma E, NP    Allergies Aspirin  Family History  Problem Relation Age of Onset  . CAD Sister   . Breast cancer Sister 66       twin sister  . Cancer Sister   . Cancer Maternal Grandmother     Social History Social History   Tobacco Use  . Smoking status: Never Smoker  . Smokeless tobacco: Never Used  Substance Use Topics  . Alcohol use: Never  . Drug use: Never    Review of Systems Constitutional: No fever/chills Eyes: No visual changes. ENT: No sore throat. Cardiovascular:  Denies chest pain. Respiratory: Positive for shortness of breath. Gastrointestinal: No abdominal pain.  Positive for rectal bleeding Genitourinary: Negative for dysuria. Musculoskeletal: Negative for back pain. Skin: Negative for rash. Neurological: Negative for headaches, focal weakness or numbness.  ____________________________________________   PHYSICAL EXAM:  VITAL SIGNS: ED Triage Vitals  Enc Vitals Group     BP 10/09/19 0808 115/63     Pulse Rate 10/09/19 0808 94     Resp 10/09/19 0808 (!) 28     Temp 10/09/19 0811 (!) 100.9 F (38.3 C)     Temp Source 10/09/19 0808 Oral     SpO2  10/09/19 0808 (!) 88 %     Weight 10/09/19 0808 207 lb (93.9 kg)     Height 10/09/19 0808 5\' 7"  (1.702 m)     Head Circumference --      Peak Flow --      Pain Score 10/09/19 0808 7   Constitutional: Alert and oriented.  Eyes: Conjunctivae are normal.  ENT      Head: Normocephalic and atraumatic.      Nose: No congestion/rhinnorhea.      Mouth/Throat: Mucous membranes are moist.      Neck: No stridor. Hematological/Lymphatic/Immunilogical: No cervical lymphadenopathy. Cardiovascular: Normal rate, regular rhythm.  No murmurs, rubs, or gallops.  Respiratory: Normal respiratory effort without tachypnea nor retractions. Breath sounds are clear and equal bilaterally. No wheezes/rales/rhonchi. Gastrointestinal: Soft and non tender. No rebound. No guarding.  Genitourinary: Deferred Musculoskeletal: Normal range of motion in all extremities. No lower extremity edema. Neurologic:  Normal speech and language. No gross focal neurologic deficits are appreciated.  Skin:  Skin is warm, dry and intact. No rash noted. Psychiatric: Mood and affect are normal. Speech and behavior are normal. Patient exhibits appropriate insight and judgment.  ____________________________________________    LABS (pertinent positives/negatives)  D-dimer 739.14 CBC wbc 8.1, hgb 12.2, plt 291 Ferritin 518 BMP na 134, k 3.6, glu 112, cr 1.04 Trop hs 4 Procalcitonin <0.10 ____________________________________________   EKG  I, Nance Pear, attending physician, personally viewed and interpreted this EKG  EKG Time: 0812 Rate: 93 Rhythm: sinus rhythm Axis: left axis deviation Intervals: qtc 442 QRS: LAFB ST changes: no st elevation Impression: abnormal ekg  ____________________________________________    RADIOLOGY  CXR Streaky airspace opacities ____________________________________________   PROCEDURES  Procedures  ____________________________________________   INITIAL IMPRESSION /  ASSESSMENT AND PLAN / ED COURSE  Pertinent labs & imaging results that were available during my care of the patient were reviewed by me and considered in my medical decision making (see chart for details).   Patient presented to the emergency department today because of concern for rectal bleeding. Patient without significant hypotension or tachycardia to suggest significant blood loss. Additionally hgb without anemia. The patient also was recently diagnosed with COVID. Complaining of some shortness of breath. Patient was hypoxic on room air. Placed on 2L [place]. Will plan on admission to the hospital. Discussed plan with patient.   ____________________________________________   FINAL CLINICAL IMPRESSION(S) / ED DIAGNOSES  Final diagnoses:  Hypoxia  COVID-19  Rectal bleed     Note: This dictation was prepared with Dragon dictation. Any transcriptional errors that result from this process are unintentional     Nance Pear, MD 10/09/19 [phone]

## 2019-10-09 NOTE — ED Triage Notes (Signed)
Patient diagnosed COVID+ 10/03/19. Reports she has been having increased sob and right sided chest pressure. O2 sats 88-89% on RA

## 2019-10-09 NOTE — H&P (Signed)
History and Physical    Rebekah Salinas IWO:[id number] DOB: [birth year] DOA: 10/09/2019  Referring MD/NP/PA:   PCP: Rolm Gala, NP   Patient coming from:  The patient is coming from home.  At baseline, pt is independent for most of ADL.        Chief Complaint: SOB, chest pain and rectal bleeding  HPI: Rebekah Salinas is a 64 y.o. female with medical history significant of hypertension, hyperlipidemia, prediabetes, GERD, hypothyroidism, who presents with shortness of breath, chest pain, rectal bleeding.  Patient had positive COVID-19 test on 10/03/2019.  She developed shortness of breath, which has been progressively worsening.  She has dry cough, fever and chills.  She also reports right-sided chest pain, which is mild, sharp, constant, nonradiating.  She has oxygen desaturation to 88% on room air.  Patient reports several episode of bright red rectal bleeding in the past several days.  Denies nausea, vomiting, diarrhea and abdominal pain.  No symptoms of UTI or unilateral weakness.  ED Course: pt was found to have hemoglobin 12.2 (12.9 on 03/09/2019), WBC 8.1, troponin IV, electrolytes renal function okay, temperature 100.9, blood pressure 115/63, heart rate 94, oxygen saturation 96% on 2 L nasal cannula oxygen.  Chest x-ray showed streaks of opacity in the bilateral lower lobe.  Patient is admitted to MedSurg bed as inpatient.  Review of Systems:   General: has fevers, chills, no body weight gain, has fatigue HEENT: no blurry vision, hearing changes or sore throat Respiratory: has dyspnea, coughing, no wheezing CV: has chest pain, no palpitations GI: no nausea, vomiting, abdominal pain, diarrhea, constipation. Has rectal bleeding. GU: no dysuria, burning on urination, increased urinary frequency, hematuria  Ext: no leg edema Neuro: no unilateral weakness, numbness, or tingling, no vision change or hearing loss Skin: no rash, no skin tear. MSK: No muscle spasm, no deformity, no  limitation of range of movement in spin Heme: No easy bruising.  Travel history: No recent long distant travel.  Allergy:  Allergies  Allergen Reactions  . Aspirin Nausea And Vomiting    Past Medical History:  Diagnosis Date  . Diabetes mellitus without complication (HCC)   . Hypertension   . Thyroid disease     Past Surgical History:  Procedure Laterality Date  . ABDOMINAL HYSTERECTOMY    . BREAST BIOPSY Left    neg  . BREAST BIOPSY Left    core bx- neg  . BREAST BIOPSY Left 06/13/2019   [place] bx 2:00 coil   . TUBAL LIGATION      Social History:  reports that she has never smoked. She has never used smokeless tobacco. She reports that she does not drink alcohol or use drugs.  Family History:  Family History  Problem Relation Age of Onset  . CAD Sister   . Breast cancer Sister 44       twin sister  . Cancer Sister   . Cancer Maternal Grandmother      Prior to Admission medications   Medication Sig Start Date End Date Taking? Authorizing Provider  acetaminophen (TYLENOL) 325 MG tablet Take 650 mg by mouth every 6 (six) hours as needed.    [provider]  atorvastatin (LIPITOR) 40 MG tablet Take 1 tablet (40 mg total) by mouth daily. 09/29/19   Iloabachie, Chioma E, NP  cyclobenzaprine (FLEXERIL) 10 MG tablet Take 1 tablet (10 mg total) by mouth 2 (two) times daily. 03/15/19   Iloabachie, Chioma E, NP  levothyroxine (SYNTHROID) 88 MCG tablet TAKE ONE  TABLET BY MOUTH EVERY DAY BEFORE BREAKFAST. 09/29/19   Iloabachie, Chioma E, NP  lisinopril (ZESTRIL) 5 MG tablet Take 1 tablet (5 mg total) by mouth daily. 09/29/19   Iloabachie, Chioma E, NP  metFORMIN (GLUCOPHAGE) 500 MG tablet TAKE TWO TABLETS (1000MG ) BY MOUTH EVERY DAY WITH BREAKFAST. 09/29/19   Iloabachie, Chioma E, NP  omeprazole (PRILOSEC) 20 MG capsule Take 1 capsule (20 mg total) by mouth daily. 09/29/19   Iloabachie, Chioma E, NP  primidone (MYSOLINE) 50 MG tablet TAKE ONE TABLET BY MOUTH 2 TIMES A DAY 09/29/19    Iloabachie, Chioma E, NP    Physical Exam: Vitals:   10/09/19 1600 10/09/19 1630 10/09/19 1659 10/09/19 1754  BP: 126/77 132/83 126/78   Pulse: 93 88 99   Resp: (!) 30 (!) 25 (!) 24 20  Temp:      TempSrc:      SpO2: 97% 100% 95%   Weight:      Height:       General: Not in acute distress HEENT:       Eyes: PERRL, EOMI, no scleral icterus.       ENT: No discharge from the ears and nose, no pharynx injection, no tonsillar enlargement.        Neck: No JVD, no bruit, no mass felt. Heme: No neck lymph node enlargement. Cardiac: S1/S2, RRR, No murmurs, No gallops or rubs. Respiratory: No rales, wheezing, rhonchi or rubs. GI: Soft, nondistended, nontender, no rebound pain, no organomegaly, BS present. GU: No hematuria Ext: No pitting leg edema bilaterally. 2+DP/PT pulse bilaterally. Musculoskeletal: No joint deformities, No joint redness or warmth, no limitation of ROM in spin. Skin: No rashes.  Neuro: Alert, oriented X3, cranial nerves II-XII grossly intact, moves all extremities normally.  Psych: Patient is not psychotic, no suicidal or hemocidal ideation.  Labs on Admission: I have personally reviewed following labs and imaging studies  CBC: Recent Labs  Lab 10/09/19 0817 10/09/19 1455 10/09/19 1606  WBC 8.1 9.1 9.0  NEUTROABS 6.9  --   --   HGB 12.2 12.1 13.0  HCT 37.5 37.5 40.0  MCV 85.0 85.6 84.9  PLT 291 324 093   Basic Metabolic Panel: Recent Labs  Lab 10/09/19 0817  NA 134*  K 3.6  CL 98  CO2 26  GLUCOSE 112*  BUN 11  CREATININE 1.04*  CALCIUM 8.4*   GFR: Estimated Creatinine Clearance: 66 mL/min (A) (by C-G formula based on SCr of 1.04 mg/dL (H)). Liver Function Tests: Recent Labs  Lab 10/09/19 1124  AST 40  ALT 25  ALKPHOS 53  BILITOT 0.9  PROT 6.7  ALBUMIN 3.2*   No results for input(s): LIPASE, AMYLASE in the last 168 hours. No results for input(s): AMMONIA in the last 168 hours. Coagulation Profile: No results for input(s): INR,  PROTIME in the last 168 hours. Cardiac Enzymes: No results for input(s): CKTOTAL, CKMB, CKMBINDEX, TROPONINI in the last 168 hours. BNP (last 3 results) No results for input(s): PROBNP in the last 8760 hours. HbA1C: No results for input(s): HGBA1C in the last 72 hours. CBG: No results for input(s): GLUCAP in the last 168 hours. Lipid Profile: Recent Labs    10/09/19 1603  TRIG 72   Thyroid Function Tests: No results for input(s): TSH, T4TOTAL, FREET4, T3FREE, THYROIDAB in the last 72 hours. Anemia Panel: Recent Labs    10/09/19 0817  FERRITIN 518*   Urine analysis:    Component Value Date/Time   COLORURINE YELLOW 11/13/2008  1506   APPEARANCEUR Clear 12/07/2018 1450   LABSPEC 1.009 11/13/2008 1506   PHURINE 6.0 11/13/2008 1506   GLUCOSEU Negative 12/07/2018 1450   HGBUR NEGATIVE 11/13/2008 1506   BILIRUBINUR Negative 12/07/2018 1450   KETONESUR NEGATIVE 11/13/2008 1506   PROTEINUR Negative 12/07/2018 1450   PROTEINUR NEGATIVE 11/13/2008 1506   UROBILINOGEN 0.2 11/13/2008 1506   NITRITE Negative 12/07/2018 1450   NITRITE NEGATIVE 11/13/2008 1506   LEUKOCYTESUR Negative 12/07/2018 1450   Sepsis Labs: @LABRCNTIP (procalcitonin:4,lacticidven:4) ) Recent Results (from the past 240 hour(s))  Novel Coronavirus, NAA (Hosp order, Send-out to Ref Lab; TAT 18-24 hrs     Status: Abnormal   Collection Time: 10/03/19  4:09 PM   Specimen: Nasopharyngeal Swab; Respiratory  Result Value Ref Range Status   SARS-CoV-2, NAA DETECTED (A) NOT DETECTED Final    Comment: Tonia GhentJane Ryan [facility] notified 10/07/2019 at 1506 SWB. (NOTE)                  Client Requested Flag This nucleic acid amplification test was developed and its performance characteristics determined by [facility]. Nucleic acid amplification tests include PCR and TMA. This test has not been FDA cleared or approved. This test has been authorized by FDA under an Emergency Use Authorization (EUA). This test is  only authorized for the duration of time the declaration that circumstances exist justifying the authorization of the emergency use of in vitro diagnostic tests for detection of SARS-CoV-2 virus and/or diagnosis of COVID-19 infection under section 564(b)(1) of the Act, 21 U.S.C. 387FIE-3(P360bbb-3(b) (1), unless the authorization is terminated or revoked sooner. When diagnostic testing is negative, the possibility of a false negative result should be considered in the context of a patient's recent exposures and the presence of clinical signs and symptoms consistent with  COVID-19. An individual without symptoms of COVID- 19 and who is not shedding SARS-CoV-2 virus would expect to have a negative (not detected) result in this assay. Performed At: [facility] [address] [place], [place] [id number] Jolene SchimkeNagendra Sanjai MD SA:[id number]:[phone]    Coronavirus Source NASOPHARYNGEAL  Final    Comment: Performed at [facility], [address] [place], [place] [address]     Radiological Exams on Admission: CT ANGIO CHEST PE W OR WO CONTRAST  Result Date: 10/09/2019 CLINICAL DATA:  Shortness of breath. COVID-19 positive diagnosed on 10/03/2019 EXAM: CT ANGIOGRAPHY CHEST WITH CONTRAST TECHNIQUE: Multidetector CT imaging of the chest was performed using the standard protocol during bolus administration of intravenous contrast. Multiplanar CT image reconstructions and MIPs were obtained to evaluate the vascular anatomy. CONTRAST:  75mL OMNIPAQUE IOHEXOL 350 MG/ML SOLN COMPARISON:  Chest radiograph October 09, 2019, CT of the chest November 11, 2008 FINDINGS: Cardiovascular: Satisfactory opacification of the central pulmonary arteries. Suboptimal visualization of the segmental branches due to breathing motion artifact. No evidence of central pulmonary embolism. Normal heart size. No pericardial effusion. Mediastinum/Nodes: No enlarged mediastinal, hilar, or axillary lymph nodes. Thyroid gland, trachea,  and esophagus demonstrate no significant findings. Lungs/Pleura: Mild streaky peribronchial airspace opacities in the lower lobes. Upper Abdomen: No acute abnormality. Musculoskeletal: No chest wall abnormality. No acute or significant osseous findings. 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 Review of the MIP images confirms the above findings. IMPRESSION: 1. No evidence of central pulmonary embolus. 2. Suboptimal visualization of the segmental pulmonary arterial branches due to breathing motion artifact. 3. Mild streaky peribronchial airspace opacities in the bilateral lower lobes, which may represent atelectasis or  early viral pneumonia. Electronically Signed   By: Ted Mcalpine M.D.   On: 10/09/2019 13:25   DG Chest Portable 1 View  Result Date: 10/09/2019 CLINICAL DATA:  Patient positive for covid on 10-03-19 c/o increasing sob and right sided chest pressuresob/covid pos EXAM: PORTABLE CHEST 1 VIEW COMPARISON:  06/02/2016 FINDINGS: Normal cardiac silhouette. Low lung volumes. There is streaky airspace opacities in the RIGHT lower lobe and LEFT lower lobe. Upper lungs are clear. No pneumothorax. IMPRESSION: 1. Low lung volumes. 2. Streaky airspace opacities in lower lobes concerning for COVID pneumonia. Electronically Signed   By: Genevive Bi M.D.   On: 10/09/2019 08:57     EKG: Independently reviewed.  Sinus rhythm, QTC 442, low voltage, LAD, nonspecific T wave change.  Assessment/Plan Principal Problem:   Acute respiratory disease due to COVID-19 virus Active Problems:   Chest pain   Prediabetes   Hypothyroidism   Essential hypertension   History of gastroesophageal reflux (GERD)   HLD (hyperlipidemia)   Rectal bleeding   Acute respiratory disease due to COVID-19 virus: Patient has oxygen desaturation to 88% on room air, and a positive chest x-ray with streaks of infiltration in lower lobes bilaterally.  -will admit to med-surg bed as  inpt -Remdesivir per pharm -Solumedrol 40 mg bid -vitamin C, zinc.  -Bronchodilators -PRN Mucinex for cough -f/u Blood culture -Gentle IV fluid:  -D-dimer, BNP,Trop, LFT, CRP, LDH, Procalcitonin, Ferritin, fibinogen, TG, Hep B SAg, HIV ab -Daily CRP, Ferritin, D-dimer, -Will ask the patient to maintain an awake prone position for 16+ hours a day, if possible, with a minimum of 2-3 hours at a time -Will attempt to maintain euvolemia to a net negative fluid status -IF patient deteriorates, will consult PCCM and ID  Chest pain: likely due to covid 19 infection. CTA negative for PE. Trop 4. -Trend troponin -Check A1c, FLP -Repeat EKG in the morning  Prediabetes: A1c 6.0 on 06/15/19. Blood sugar 112. -hold Metformin in hospital -Check CBG every morning  Hypothyroidism -Synthroid  HTN:  -Continue home medications -hydralazine prn  History of gastroesophageal reflux (GERD): -PPI  HLD (hyperlipidemia): -lipitor  Rectal bleeding: Hemoglobin stable, 12.2 --> 13.0.  Etiology is not clear, may be due to hemorrhoid -check cbc q6h -f/u with GI as outpt   Inpatient status:  # Patient requires inpatient status due to high intensity of service, high risk for further deterioration and high frequency of surveillance required.  I certify that at the point of admission it is my clinical judgment that the patient will require inpatient hospital care spanning beyond 2 midnights from the point of admission.  . This patient has multiple chronic comorbidities including hypertension, hyperlipidemia, prediabetes, GERD, hypothyroidism .  Marland Kitchen Now patient has presenting with Acute respiratory disease due to COVID-19 virus with hypoxia, also has chest pain and rectal bleeding. . The initial radiographic and laboratory data are worrisome because of bilateral lower lobe infiltration on chest x-ray. . Current medical needs: please see my assessment and plan . Predictability of an adverse outcome  (risk): Patient has multiple comorbidities as listed above. now presents with Acute respiratory disease due to COVID-19 virus with hypoxia, also has chest pain and rectal bleeding. Patient's presentation is highly complicated.  Patient is at high risk of deteriorating.  Will need to be treated in hospital for at least 2 days.             DVT ppx: SCD Code Status: Full code Family Communication: None at bed side.  Disposition Plan:  Anticipate discharge back to previous home environment Consults called:  None Admission status: Med-surg bed as inpt      Date of Service 10/09/2019    Lorretta Harp [facility]   If 7PM-7AM, please contact night-coverage [email or web address] Password [facility] 10/09/2019, 7:19 PM

## 2019-10-09 NOTE — Progress Notes (Addendum)
Remdesivir - Pharmacy Brief Note   O:  ALT: 24 CXR: Streaky airspace opacities in lower lobes concerning for COVID pneumonia. SpO2: 88% on room air   A/P:  Remdesivir 200 mg IVPB once followed by 100 mg IVPB daily x 4 days.   .me 10/09/2019 8:59 AM

## 2019-10-09 NOTE — Progress Notes (Signed)
End of Shift Summary:  Date: 10/09/19 Shift: 1700-1900 Ambulatory: up to [facility] with o2 and standby assist O2: 2L [place], sometimes requires 3-4L when up to [facility]. Significant Events: Writer called and updated patient's daughter Rebekah Salinas, who states patient and her husband live in independent living at [facility], and patient's husband Rebekah Salinas is at [facility] with COVID. Patient did have an episode of nonbloody stool this evening.

## 2019-10-09 NOTE — ED Notes (Addendum)
Pt ambulated to toilet to urinate. Became tachypnic with movement and had a coughing spell. Increased Cable from 2 L to 4 L for comfort. Oxygen sats stayed at 91% during movement.   Spoke to pt daughter and provided update.

## 2019-10-10 LAB — CBC
HCT: 37.7 % (ref 36.0–46.0)
HCT: 38.4 % (ref 36.0–46.0)
Hemoglobin: 12.1 g/dL (ref 12.0–15.0)
Hemoglobin: 12.3 g/dL (ref 12.0–15.0)
MCH: 27.5 pg (ref 26.0–34.0)
MCH: 27.5 pg (ref 26.0–34.0)
MCHC: 32 g/dL (ref 30.0–36.0)
MCHC: 32.1 g/dL (ref 30.0–36.0)
MCV: 85.7 fL (ref 80.0–100.0)
MCV: 85.9 fL (ref 80.0–100.0)
Platelets: 334 10*3/uL (ref 150–400)
Platelets: 373 10*3/uL (ref 150–400)
RBC: 4.4 MIL/uL (ref 3.87–5.11)
RBC: 4.47 MIL/uL (ref 3.87–5.11)
RDW: 14 % (ref 11.5–15.5)
RDW: 14 % (ref 11.5–15.5)
WBC: 5.6 10*3/uL (ref 4.0–10.5)
WBC: 7.2 10*3/uL (ref 4.0–10.5)
nRBC: 0 % (ref 0.0–0.2)
nRBC: 0 % (ref 0.0–0.2)

## 2019-10-10 LAB — COMPREHENSIVE METABOLIC PANEL
ALT: 27 U/L (ref 0–44)
AST: 36 U/L (ref 15–41)
Albumin: 3.2 g/dL — ABNORMAL LOW (ref 3.5–5.0)
Alkaline Phosphatase: 52 U/L (ref 38–126)
Anion gap: 13 (ref 5–15)
BUN: 17 mg/dL (ref 8–23)
CO2: 24 mmol/L (ref 22–32)
Calcium: 8.6 mg/dL — ABNORMAL LOW (ref 8.9–10.3)
Chloride: 101 mmol/L (ref 98–111)
Creatinine, Ser: 0.93 mg/dL (ref 0.44–1.00)
GFR calc Af Amer: >60 mL/min (ref >60)
GFR calc non Af Amer: >60 mL/min (ref >60)
Glucose, Bld: 136 mg/dL — ABNORMAL HIGH (ref 70–99)
Potassium: 3.9 mmol/L (ref 3.5–5.1)
Sodium: 138 mmol/L (ref 135–145)
Total Bilirubin: 0.8 mg/dL (ref 0.3–1.2)
Total Protein: 7.1 g/dL (ref 6.5–8.1)

## 2019-10-10 LAB — HEMOGLOBIN A1C
Hgb A1c MFr Bld: 6.3 % — ABNORMAL HIGH (ref 4.8–5.6)
Mean Plasma Glucose: 134.11 mg/dL

## 2019-10-10 LAB — LIPID PANEL
Cholesterol: 83 mg/dL (ref 0–200)
HDL: 24 mg/dL — ABNORMAL LOW (ref >40)
LDL Cholesterol: 48 mg/dL (ref 0–99)
Total CHOL/HDL Ratio: 3.5 RATIO
Triglycerides: 53 mg/dL (ref <150)
VLDL: 11 mg/dL (ref 0–40)

## 2019-10-10 LAB — FIBRIN DERIVATIVES D-DIMER (ARMC ONLY): Fibrin derivatives D-dimer (ARMC): 973.28 ng/mL (FEU) — ABNORMAL HIGH (ref 0.00–499.00)

## 2019-10-10 LAB — C-REACTIVE PROTEIN: CRP: 11.8 mg/dL — ABNORMAL HIGH (ref <1.0)

## 2019-10-10 LAB — FERRITIN: Ferritin: 534 ng/mL — ABNORMAL HIGH (ref 11–307)

## 2019-10-10 LAB — GLUCOSE, CAPILLARY: Glucose-Capillary: 121 mg/dL — ABNORMAL HIGH (ref 70–99)

## 2019-10-10 LAB — MAGNESIUM: Magnesium: 2.4 mg/dL (ref 1.7–2.4)

## 2019-10-10 NOTE — Progress Notes (Signed)
PROGRESS NOTE    Mayte Diers  WCH:[id number] DOB: 1956-02-20 DOA: 10/09/2019 PCP: Rolm Gala, NP    Brief Narrative:  Rebekah Salinas is a 64 y.o. female with medical history significant of hypertension, hyperlipidemia, prediabetes, GERD, hypothyroidism, who presents with shortness of breath, chest pain, rectal bleeding.  Patient had positive COVID-19 test on 10/03/2019.  She developed shortness of breath, which has been progressively worsening.  She has dry cough, fever and chills.  She also reports right-sided chest pain, which is mild, sharp, constant, nonradiating.  She has oxygen desaturation to 88% on room air.  Patient reports several episode of bright red rectal bleeding in the past several days.  Denies nausea, vomiting, diarrhea and abdominal pain.  No symptoms of UTI or unilateral weakness.  1/18: Patient with flattened affect.  According to nursing staff she has family members hospitalized with Covid infection.  Stable on 2 L nasal cannula.  Answers all my questions appropriately.   Assessment & Plan:   Principal Problem:   Acute respiratory disease due to COVID-19 virus Active Problems:   Chest pain   Prediabetes   Hypothyroidism   Essential hypertension   History of gastroesophageal reflux (GERD)   HLD (hyperlipidemia)   Rectal bleeding  Acute respiratory disease due to COVID-19 virus  Patient has oxygen desaturation to 88% on room air positive chest x-ray with streaks of infiltration in lower lobes bilaterally Now requiring 2L [place] Plan: -Remdesivir per pharm (day 2/5) -Solumedrol 40 mg bid (day 2/10) -vitamin C, zinc.  -Bronchodilators -PRN Mucinex for cough  -Trend inflammatory markers -Prone as tolerated -Titrate oxygen as tolerated  Chest pain likely due to covid 19 infection  CTA negative for PE Trop 4 - Non cardiac - No indication to trend troponins at this point  Prediabetes A1c 6.0 on 06/15/19.  Blood sugar 112. -hold Metformin in  hospital -Check CBG every morning  Hypothyroidism -Synthroid  HTN:  -Continue home medications -hydralazine prn  History of gastroesophageal reflux (GERD) -PPI  HLD (hyperlipidemia) -lipitor  Rectal bleeding  Hemoglobin stable, 12.2 --> 13.0.   Etiology is not clear, may be due to hemorrhoid - Daily CBC - Outpatient GI followup   DVT prophylaxis: SCD Code Status: FULL Family Communication: none today Disposition Plan: pending clinical improvement  Consultants:   none  Procedures:   none  Antimicrobials:   Remdesivir (1/17-  )   Subjective: Seen and examined Flattened affect On 2L [place] No complaints  Objective: Vitals:   10/09/19 1659 10/09/19 1754 10/09/19 2340 10/10/19 0700  BP: 126/78  104/69 (!) 93/53  Pulse: 99  85 85  Resp: (!) 24 20 19  (!) 21  Temp:   98.2 F (36.8 C) 98.2 F (36.8 C)  TempSrc:   Oral Oral  SpO2: 95%  94% 94%  Weight:      Height:       No intake or output data in the 24 hours ending 10/10/19 1341 Filed Weights   10/09/19 0808  Weight: 93.9 kg    Examination:  General exam: Appears calm and comfortable  Respiratory system: Normal work of breathing.  Scattered crackles Cardiovascular system: S1 & S2 heard, RRR. No JVD, murmurs, rubs, gallops or clicks. No pedal edema. Gastrointestinal system: Abdomen is nondistended, soft and nontender. No organomegaly or masses felt. Normal bowel sounds heard. Central nervous system: Alert and oriented. No focal neurological deficits. Extremities: Symmetric 5 x 5 power. Skin: No rashes, lesions or ulcers Psychiatry: Judgement and insight appear normal. Mood &  affect appropriate.     Data Reviewed: I have personally reviewed following labs and imaging studies  CBC: Recent Labs  Lab 10/09/19 0817 10/09/19 1455 10/09/19 1606 10/10/19 0020 10/10/19 0606  WBC 8.1 9.1 9.0 7.2 5.6  NEUTROABS 6.9  --   --   --   --   HGB 12.2 12.1 13.0 12.3 12.1  HCT 37.5 37.5 40.0 38.4  37.7  MCV 85.0 85.6 84.9 85.9 85.7  PLT 291 324 342 334 831   Basic Metabolic Panel: Recent Labs  Lab 10/09/19 0817 10/10/19 0606  NA 134* 138  K 3.6 3.9  CL 98 101  CO2 26 24  GLUCOSE 112* 136*  BUN 11 17  CREATININE 1.04* 0.93  CALCIUM 8.4* 8.6*  MG  --  2.4   GFR: Estimated Creatinine Clearance: 73.8 mL/min (by C-G formula based on SCr of 0.93 mg/dL). Liver Function Tests: Recent Labs  Lab 10/09/19 1124 10/10/19 0606  AST 40 36  ALT 25 27  ALKPHOS 53 52  BILITOT 0.9 0.8  PROT 6.7 7.1  ALBUMIN 3.2* 3.2*   No results for input(s): LIPASE, AMYLASE in the last 168 hours. No results for input(s): AMMONIA in the last 168 hours. Coagulation Profile: No results for input(s): INR, PROTIME in the last 168 hours. Cardiac Enzymes: No results for input(s): CKTOTAL, CKMB, CKMBINDEX, TROPONINI in the last 168 hours. BNP (last 3 results) No results for input(s): PROBNP in the last 8760 hours. HbA1C: Recent Labs    10/10/19 0606  HGBA1C 6.3*   CBG: Recent Labs  Lab 10/10/19 0736  GLUCAP 121*   Lipid Profile: Recent Labs    10/09/19 1603 10/10/19 0606  CHOL  --  83  HDL  --  24*  LDLCALC  --  48  TRIG 72 53  CHOLHDL  --  3.5   Thyroid Function Tests: No results for input(s): TSH, T4TOTAL, FREET4, T3FREE, THYROIDAB in the last 72 hours. Anemia Panel: Recent Labs    10/09/19 0817 10/10/19 0606  FERRITIN 518* 534*   Sepsis Labs: Recent Labs  Lab 10/09/19 0817  PROCALCITON <0.10    Recent Results (from the past 240 hour(s))  Novel Coronavirus, NAA (Hosp order, Send-out to Ref Lab; TAT 18-24 hrs     Status: Abnormal   Collection Time: 10/03/19  4:09 PM   Specimen: Nasopharyngeal Swab; Respiratory  Result Value Ref Range Status   SARS-CoV-2, NAA DETECTED (A) NOT DETECTED Final    Comment: Gwynn Burly [facility] ED notified 10/07/2019 at 1506 SWB. (NOTE)                  Client Requested Flag This nucleic acid amplification test was developed and  its performance characteristics determined by [employer]. Nucleic acid amplification tests include PCR and TMA. This test has not been FDA cleared or approved. This test has been authorized by FDA under an Emergency Use Authorization (EUA). This test is only authorized for the duration of time the declaration that circumstances exist justifying the authorization of the emergency use of in vitro diagnostic tests for detection of SARS-CoV-2 virus and/or diagnosis of COVID-19 infection under section 564(b)(1) of the Act, 21 U.S.C. 517OHY-0(V) (1), unless the authorization is terminated or revoked sooner. When diagnostic testing is negative, the possibility of a false negative result should be considered in the context of a patient's recent exposures and the presence of clinical signs and symptoms consistent with  COVID-19. An individual without symptoms of COVID- 19 and who  is not shedding SARS-CoV-2 virus would expect to have a negative (not detected) result in this assay. Performed At: [facility] [address] [place], [place] [id number] Jolene Schimke MD KD:[id number]    Coronavirus Source NASOPHARYNGEAL  Final    Comment: Performed at [facility], [address] [place], [place] [address]  Culture, blood (Routine X 2) w Reflex to ID Panel     Status: None (Preliminary result)   Collection Time: 10/09/19  4:06 PM   Specimen: BLOOD  Result Value Ref Range Status   Specimen Description BLOOD LEFT ANTECUBITAL  Final   Special Requests   Final    BOTTLES DRAWN AEROBIC AND ANAEROBIC Blood Culture results may not be optimal due to an excessive volume of blood received in culture bottles   Culture   Final    NO GROWTH < 24 HOURS Performed at [facility], [address], [place], [place] [address]    Report Status PENDING  Incomplete  Culture, blood (Routine X 2) w Reflex to ID Panel     Status: None (Preliminary result)   Collection Time:  10/09/19  7:22 PM   Specimen: BLOOD  Result Value Ref Range Status   Specimen Description BLOOD RIGHT ANTECUBITAL  Final   Special Requests   Final    BOTTLES DRAWN AEROBIC AND ANAEROBIC Blood Culture adequate volume   Culture   Final    NO GROWTH < 12 HOURS Performed at [facility], [address], [place], [place] [address]    Report Status PENDING  Incomplete         Radiology Studies: CT ANGIO CHEST PE W OR WO CONTRAST  Result Date: 10/09/2019 CLINICAL DATA:  Shortness of breath. COVID-19 positive diagnosed on 10/03/2019 EXAM: CT ANGIOGRAPHY CHEST WITH CONTRAST TECHNIQUE: Multidetector CT imaging of the chest was performed using the standard protocol during bolus administration of intravenous contrast. Multiplanar CT image reconstructions and MIPs were obtained to evaluate the vascular anatomy. CONTRAST:  45mL OMNIPAQUE IOHEXOL 350 MG/ML SOLN COMPARISON:  Chest radiograph October 09, 2019, CT of the chest November 11, 2008 FINDINGS: Cardiovascular: Satisfactory opacification of the central pulmonary arteries. Suboptimal visualization of the segmental branches due to breathing motion artifact. No evidence of central pulmonary embolism. Normal heart size. No pericardial effusion. Mediastinum/Nodes: No enlarged mediastinal, hilar, or axillary lymph nodes. Thyroid gland, trachea, and esophagus demonstrate no significant findings. Lungs/Pleura: Mild streaky peribronchial airspace opacities in the lower lobes. Upper Abdomen: No acute abnormality. Musculoskeletal: No chest wall abnormality. No acute or significant osseous findings. 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 Review of the MIP images confirms the above findings. IMPRESSION: 1. No evidence of central pulmonary embolus. 2. Suboptimal visualization of the segmental pulmonary arterial branches due to breathing motion artifact. 3. Mild streaky peribronchial airspace opacities in the bilateral  lower lobes, which may represent atelectasis or early viral pneumonia. Electronically Signed   By: Ted Mcalpine M.D.   On: 10/09/2019 13:25   DG Chest Portable 1 View  Result Date: 10/09/2019 CLINICAL DATA:  Patient positive for covid on 10-03-19 c/o increasing sob and right sided chest pressuresob/covid pos EXAM: PORTABLE CHEST 1 VIEW COMPARISON:  06/02/2016 FINDINGS: Normal cardiac silhouette. Low lung volumes. There is streaky airspace opacities in the RIGHT lower lobe and LEFT lower lobe. Upper lungs are clear. No pneumothorax. IMPRESSION: 1. Low lung volumes. 2. Streaky airspace opacities in lower lobes concerning for COVID pneumonia. Electronically Signed   By: Roseanne Reno  Amil Amen M.D.   On: 10/09/2019 08:57        Scheduled Meds: . vitamin C  500 mg Oral Daily  . atorvastatin  40 mg Oral Daily  . cyclobenzaprine  10 mg Oral BID  . dextromethorphan-guaiFENesin  1 tablet Oral BID  . ipratropium  2 puff Inhalation Q4H  . levothyroxine  88 mcg Oral Q0600  . lisinopril  5 mg Oral Daily  . methylPREDNISolone (SOLU-MEDROL) injection  40 mg Intravenous Q12H  . pantoprazole  40 mg Oral Daily  . primidone  50 mg Oral BID  . zinc sulfate  220 mg Oral Daily   Continuous Infusions: . remdesivir 100 mg in NS 100 mL 100 mg (10/10/19 1118)     LOS: 1 day    Time spent: 35 minutes    Tresa Moore, MD [facility] Pager [phone]  If 7PM-7AM, please contact night-coverage [email or web address] Password [facility] 10/10/2019, 1:41 PM

## 2019-10-10 NOTE — Evaluation (Signed)
Physical Therapy Evaluation Patient Details Name: Rebekah Salinas MRN: [id number] DOB: [birth year] Today's Date: 10/10/2019   History of Present Illness  Pt is a 64 y.o. female presenting to hospital 10/08/18 with SOB, R sided chest pain, and rectal bleeding.  (+) COVID 10/03/19.  Pt admitted with acute respiratory disease d/t COVID-19 virus and chest pain.  PMH includes htn and prediabetes.  Clinical Impression  Prior to hospital admission, pt was independent.  Currently pt is modified independent semi-supine to sitting edge of bed; CGA to ambulate a few feet bed to recliner; and CGA to perform stand step turn transfers recliner to/from [facility] (pt requesting to toilet after getting to recliner).  Pt with increased respiratory rate (lower to mid 30's) and increased SOB with minimal activity (pt requiring a few minutes of sitting rest break between each activity).  O2 sats decreasing to 86% with coughing but mostly 90% or greater on 2 L O2 via nasal cannula).  HR 79 bpm at rest and increased up to 104 bpm with activity.  Pt would benefit from skilled PT to address noted impairments and functional limitations (see below for any additional details).  Upon hospital discharge, pt would benefit from [place].    Follow Up Recommendations Home health PT    Equipment Recommendations  3in1 (PT)    Recommendations for Other Services       Precautions / Restrictions Precautions Precautions: Fall Restrictions Weight Bearing Restrictions: No      Mobility  Bed Mobility Overal bed mobility: Modified Independent             General bed mobility comments: Semi-supine to sitting edge of bed with mild increased effort  Transfers Overall transfer level: Needs assistance   Transfers: Sit to/from Stand;Stand Pivot Transfers Sit to Stand: Min guard Stand pivot transfers: Min guard       General transfer comment: fairly strong stand from bed with single UE support on bed rail; stand step turn recliner  to/from [facility] with CGA  Ambulation/Gait Ambulation/Gait assistance: Min guard Gait Distance (Feet): 3 Feet(bed to recliner) Assistive device: None       General Gait Details: pt appearing more hesitant but overall steady taking steps bed to recliner; limited d/t SOB  Stairs            Wheelchair Mobility    Modified Rankin (Stroke Patients Only)       Balance Overall balance assessment: Needs assistance Sitting-balance support: No upper extremity supported;Feet supported Sitting balance-Leahy Scale: Normal Sitting balance - Comments: steady sitting reaching outside BOS   Standing balance support: No upper extremity supported Standing balance-Leahy Scale: Good Standing balance comment: steady standing reaching within BOS                             Pertinent Vitals/Pain   BP 99/53 semi-supine in bed beginning of session and 118/56 at rest in recliner end of session.    Home Living Family/patient expects to be discharged to:: Private residence Living Arrangements: Spouse/significant other(Per chart pt's husband in a different hospital currently.) Available Help at Discharge: Family Type of Home: Apartment(2nd floor) Home Access: Elevator     Home Layout: One level Home Equipment: Grab bars - tub/shower      Prior Function Level of Independence: Independent         Comments: No falls in the past 6 months.     Hand Dominance        Extremity/Trunk  Assessment   Upper Extremity Assessment Upper Extremity Assessment: Generalized weakness    Lower Extremity Assessment Lower Extremity Assessment: Generalized weakness    Cervical / Trunk Assessment Cervical / Trunk Assessment: Normal  Communication   Communication: No difficulties  Cognition Arousal/Alertness: Awake/alert Behavior During Therapy: Flat affect Overall Cognitive Status: Within Functional Limits for tasks assessed                                         General Comments   Nursing cleared pt for participation in physical therapy.  Pt agreeable to PT session.    Exercises     Assessment/Plan    PT Assessment Patient needs continued PT services  PT Problem List Decreased strength;Decreased activity tolerance;Decreased balance;Decreased mobility;Decreased knowledge of use of DME;Cardiopulmonary status limiting activity;Decreased knowledge of precautions       PT Treatment Interventions DME instruction;Gait training;Stair training;Functional mobility training;Therapeutic activities;Therapeutic exercise;Balance training;Patient/family education    PT Goals (Current goals can be found in the Care Plan section)  Acute Rehab PT Goals Patient Stated Goal: to feel better PT Goal Formulation: With patient Time For Goal Achievement: 10/24/19 Potential to Achieve Goals: Fair    Frequency Min 2X/week   Barriers to discharge        Co-evaluation               AM-PAC PT "6 Clicks" Mobility  Outcome Measure Help needed turning from your back to your side while in a flat bed without using bedrails?: None Help needed moving from lying on your back to sitting on the side of a flat bed without using bedrails?: None Help needed moving to and from a bed to a chair (including a wheelchair)?: A Little Help needed standing up from a chair using your arms (e.g., wheelchair or bedside chair)?: A Little Help needed to walk in hospital room?: A Little Help needed climbing 3-5 steps with a railing? : A Lot 6 Click Score: 19    End of Session Equipment Utilized During Treatment: Gait belt;Oxygen(2 L O2 via nasal cannula) Activity Tolerance: Other (comment)(Limited d/t SOB) Patient left: in chair;with call bell/phone within reach;with chair alarm set;with nursing/sitter in room Nurse Communication: Mobility status;Precautions;Other (comment)(pt's vitals) PT Visit Diagnosis: Other abnormalities of gait and mobility (R26.89);Muscle weakness  (generalized) (M62.81);Difficulty in walking, not elsewhere classified (R26.2)    Time: 1030-1101 PT Time Calculation (min) (ACUTE ONLY): 31 min   Charges:   PT Evaluation $PT Eval Low Complexity: 1 Low PT Treatments $Therapeutic Activity: 8-22 mins       Hendricks Limes, PT 10/10/19, 3:07 PM

## 2019-10-10 NOTE — Progress Notes (Signed)
Pt depressed this morning. Not wanting to make conversation. Asked her to take some deep breaths and she would only take shallow breaths even after instruction. Patient appears to lack the will to complete exercises and to get better. Very flat affect. Provided encouraging words and general encouragement to her to get better and keep pushing.

## 2019-10-11 LAB — CBC WITH DIFFERENTIAL/PLATELET
Abs Immature Granulocytes: 0.05 10*3/uL (ref 0.00–0.07)
Basophils Absolute: 0 10*3/uL (ref 0.0–0.1)
Basophils Relative: 0 %
Eosinophils Absolute: 0 10*3/uL (ref 0.0–0.5)
Eosinophils Relative: 0 %
HCT: 37.2 % (ref 36.0–46.0)
Hemoglobin: 12 g/dL (ref 12.0–15.0)
Immature Granulocytes: 1 %
Lymphocytes Relative: 8 %
Lymphs Abs: 0.7 10*3/uL (ref 0.7–4.0)
MCH: 27.5 pg (ref 26.0–34.0)
MCHC: 32.3 g/dL (ref 30.0–36.0)
MCV: 85.1 fL (ref 80.0–100.0)
Monocytes Absolute: 0.4 10*3/uL (ref 0.1–1.0)
Monocytes Relative: 5 %
Neutro Abs: 7.3 10*3/uL (ref 1.7–7.7)
Neutrophils Relative %: 86 %
Platelets: 428 10*3/uL — ABNORMAL HIGH (ref 150–400)
RBC: 4.37 MIL/uL (ref 3.87–5.11)
RDW: 14 % (ref 11.5–15.5)
Smear Review: NORMAL
WBC: 8.4 10*3/uL (ref 4.0–10.5)
nRBC: 0 % (ref 0.0–0.2)

## 2019-10-11 LAB — FERRITIN: Ferritin: 453 ng/mL — ABNORMAL HIGH (ref 11–307)

## 2019-10-11 LAB — COMPREHENSIVE METABOLIC PANEL
ALT: 29 U/L (ref 0–44)
AST: 34 U/L (ref 15–41)
Albumin: 3.2 g/dL — ABNORMAL LOW (ref 3.5–5.0)
Alkaline Phosphatase: 53 U/L (ref 38–126)
Anion gap: 11 (ref 5–15)
BUN: 23 mg/dL (ref 8–23)
CO2: 26 mmol/L (ref 22–32)
Calcium: 8.8 mg/dL — ABNORMAL LOW (ref 8.9–10.3)
Chloride: 100 mmol/L (ref 98–111)
Creatinine, Ser: 1.02 mg/dL — ABNORMAL HIGH (ref 0.44–1.00)
GFR calc Af Amer: >60 mL/min (ref >60)
GFR calc non Af Amer: 59 mL/min — ABNORMAL LOW (ref >60)
Glucose, Bld: 159 mg/dL — ABNORMAL HIGH (ref 70–99)
Potassium: 4 mmol/L (ref 3.5–5.1)
Sodium: 137 mmol/L (ref 135–145)
Total Bilirubin: 0.7 mg/dL (ref 0.3–1.2)
Total Protein: 7 g/dL (ref 6.5–8.1)

## 2019-10-11 LAB — MAGNESIUM: Magnesium: 2.5 mg/dL — ABNORMAL HIGH (ref 1.7–2.4)

## 2019-10-11 LAB — FIBRIN DERIVATIVES D-DIMER (ARMC ONLY): Fibrin derivatives D-dimer (ARMC): 918.32 ng/mL (FEU) — ABNORMAL HIGH (ref 0.00–499.00)

## 2019-10-11 LAB — C-REACTIVE PROTEIN: CRP: 6.5 mg/dL — ABNORMAL HIGH (ref <1.0)

## 2019-10-11 LAB — GLUCOSE, CAPILLARY: Glucose-Capillary: 148 mg/dL — ABNORMAL HIGH (ref 70–99)

## 2019-10-11 MED ORDER — SALINE SPRAY 0.65 % NA SOLN
1.0000 | NASAL | Status: DC | PRN
  Filled 2019-10-11: qty 44

## 2019-10-11 MED ORDER — GUAIFENESIN-CODEINE 100-10 MG/5ML PO SOLN
10.0000 mL | ORAL | Status: DC | PRN
  Administered 2019-10-11 – 2019-10-13 (×3): 10 mL via ORAL
  Filled 2019-10-11 (×3): qty 10

## 2019-10-11 NOTE — Progress Notes (Signed)
PROGRESS NOTE    Rebekah Salinas  GUR:[id number] DOB: 05/02/1956 DOA: 10/09/2019 PCP: Rolm Gala, NP    Brief Narrative:  Rebekah Salinas is a 64 y.o. female with medical history significant of hypertension, hyperlipidemia, prediabetes, GERD, hypothyroidism, who presents with shortness of breath, chest pain, rectal bleeding.  Patient had positive COVID-19 test on 10/03/2019.  She developed shortness of breath, which has been progressively worsening.  She has dry cough, fever and chills.  She also reports right-sided chest pain, which is mild, sharp, constant, nonradiating.  She has oxygen desaturation to 88% on room air.  Patient reports several episode of bright red rectal bleeding in the past several days.  Denies nausea, vomiting, diarrhea and abdominal pain.  No symptoms of UTI or unilateral weakness.  1/18: Patient with flattened affect.  According to nursing staff she has family members hospitalized with Covid infection.  Stable on 2 L nasal cannula.  Answers all my questions appropriately.  1/19: Patient mood slightly improved today.  Remains on 2 L nasal cannula.  Distal unable to wean.  No complaints   Assessment & Plan:   Principal Problem:   Acute respiratory disease due to COVID-19 virus Active Problems:   Chest pain   Prediabetes   Hypothyroidism   Essential hypertension   History of gastroesophageal reflux (GERD)   HLD (hyperlipidemia)   Rectal bleeding  Acute respiratory disease due to COVID-19 virus  Patient has oxygen desaturation to 88% on room air positive chest x-ray with streaks of infiltration in lower lobes bilaterally Now requiring 2L [place] Plan: -Remdesivir per pharm (day 3/5) -Solumedrol 40 mg bid (day 3/10) -vitamin C, zinc.  -Bronchodilators -PRN Mucinex for cough  -Trend inflammatory markers -Prone as tolerated -Titrate oxygen as tolerated -1/19: We will monitor in house for additional 24 hours.  If inflammatory markers continue to trend  down but patient is still requiring 2 L or less can consider discharge with outpatient supplemental oxygen.  Chest pain likely due to covid 19 infection  CTA negative for PE Trop 4 - Non cardiac - No indication to trend troponins at this point  Prediabetes A1c 6.0 on 06/15/19.  Blood sugar 112. -hold Metformin in hospital -Check CBG every morning  Hypothyroidism -Synthroid  HTN:  -Continue home medications -hydralazine prn  History of gastroesophageal reflux (GERD) -PPI  HLD (hyperlipidemia) -lipitor  Rectal bleeding  Hemoglobin stable, 12.2 --> 13.0.   Etiology is not clear, may be due to hemorrhoid - Daily CBC - Outpatient GI followup   DVT prophylaxis: SCD Code Status: FULL Family Communication: none today Disposition Plan: pending clinical improvement  Consultants:   none  Procedures:   none  Antimicrobials:   Remdesivir (1/17-  )   Subjective: Seen and examined Remains on 2 L nasal cannula Normal work of breathing No complaints  Objective: Vitals:   10/10/19 1500 10/10/19 1613 10/10/19 2339 10/11/19 0722  BP: 106/64 118/68 111/60   Pulse:  81 76   Resp: (!) 26 20 15    Temp:   97.8 F (36.6 C) 98.2 F (36.8 C)  TempSrc:   Oral Oral  SpO2:  98% 99%   Weight:      Height:        Intake/Output Summary (Last 24 hours) at 10/11/2019 1105 Last data filed at 10/11/2019 0100 Gross per 24 hour  Intake --  Output 300 ml  Net -300 ml   Filed Weights   10/09/19 0808  Weight: 93.9 kg    Examination:  General exam: Appears calm and comfortable  Respiratory system: Normal work of breathing.  Scattered crackles Cardiovascular system: S1 & S2 heard, RRR. No JVD, murmurs, rubs, gallops or clicks. No pedal edema. Gastrointestinal system: Abdomen is nondistended, soft and nontender. No organomegaly or masses felt. Normal bowel sounds heard. Central nervous system: Alert and oriented. No focal neurological deficits. Extremities: Symmetric  5 x 5 power. Skin: No rashes, lesions or ulcers Psychiatry: Judgement and insight appear normal. Mood & affect appropriate.     Data Reviewed: I have personally reviewed following labs and imaging studies  CBC: Recent Labs  Lab 10/09/19 0817 10/09/19 0817 10/09/19 1455 10/09/19 1606 10/10/19 0020 10/10/19 0606 10/11/19 0328  WBC 8.1   < > 9.1 9.0 7.2 5.6 8.4  NEUTROABS 6.9  --   --   --   --   --  7.3  HGB 12.2   < > 12.1 13.0 12.3 12.1 12.0  HCT 37.5   < > 37.5 40.0 38.4 37.7 37.2  MCV 85.0   < > 85.6 84.9 85.9 85.7 85.1  PLT 291   < > 324 342 334 373 428*   < > = values in this interval not displayed.   Basic Metabolic Panel: Recent Labs  Lab 10/09/19 0817 10/10/19 0606 10/11/19 0328  NA 134* 138 137  K 3.6 3.9 4.0  CL 98 101 100  CO2 26 24 26   GLUCOSE 112* 136* 159*  BUN 11 17 23   CREATININE 1.04* 0.93 1.02*  CALCIUM 8.4* 8.6* 8.8*  MG  --  2.4 2.5*   GFR: Estimated Creatinine Clearance: 67.3 mL/min (A) (by C-G formula based on SCr of 1.02 mg/dL (H)). Liver Function Tests: Recent Labs  Lab 10/09/19 1124 10/10/19 0606 10/11/19 0328  AST 40 36 34  ALT 25 27 29   ALKPHOS 53 52 53  BILITOT 0.9 0.8 0.7  PROT 6.7 7.1 7.0  ALBUMIN 3.2* 3.2* 3.2*   No results for input(s): LIPASE, AMYLASE in the last 168 hours. No results for input(s): AMMONIA in the last 168 hours. Coagulation Profile: No results for input(s): INR, PROTIME in the last 168 hours. Cardiac Enzymes: No results for input(s): CKTOTAL, CKMB, CKMBINDEX, TROPONINI in the last 168 hours. BNP (last 3 results) No results for input(s): PROBNP in the last 8760 hours. HbA1C: Recent Labs    10/10/19 0606  HGBA1C 6.3*   CBG: Recent Labs  Lab 10/10/19 0736 10/11/19 0722  GLUCAP 121* 148*   Lipid Profile: Recent Labs    10/09/19 1603 10/10/19 0606  CHOL  --  83  HDL  --  24*  LDLCALC  --  48  TRIG 72 53  CHOLHDL  --  3.5   Thyroid Function Tests: No results for input(s): TSH, T4TOTAL,  FREET4, T3FREE, THYROIDAB in the last 72 hours. Anemia Panel: Recent Labs    10/10/19 0606 10/11/19 0328  FERRITIN 534* 453*   Sepsis Labs: Recent Labs  Lab 10/09/19 0817  PROCALCITON <0.10    Recent Results (from the past 240 hour(s))  Novel Coronavirus, NAA (Hosp order, Send-out to Ref Lab; TAT 18-24 hrs     Status: Abnormal   Collection Time: 10/03/19  4:09 PM   Specimen: Nasopharyngeal Swab; Respiratory  Result Value Ref Range Status   SARS-CoV-2, NAA DETECTED (A) NOT DETECTED Final    Comment: 10/13/19 [facility] ED notified 10/07/2019 at 1506 SWB. (NOTE)  Client Requested Flag This nucleic acid amplification test was developed and its performance characteristics determined by [employer]. Nucleic acid amplification tests include PCR and TMA. This test has not been FDA cleared or approved. This test has been authorized by FDA under an Emergency Use Authorization (EUA). This test is only authorized for the duration of time the declaration that circumstances exist justifying the authorization of the emergency use of in vitro diagnostic tests for detection of SARS-CoV-2 virus and/or diagnosis of COVID-19 infection under section 564(b)(1) of the Act, 21 U.S.C. 132GMW-1(U) (1), unless the authorization is terminated or revoked sooner. When diagnostic testing is negative, the possibility of a false negative result should be considered in the context of a patient's recent exposures and the presence of clinical signs and symptoms consistent with  COVID-19. An individual without symptoms of COVID- 19 and who is not shedding SARS-CoV-2 virus would expect to have a negative (not detected) result in this assay. Performed At: [facility] [address] [place], [place] [id number] Jolene Schimke MD IH:[id number]    Coronavirus Source NASOPHARYNGEAL  Final    Comment: Performed at [facility], [address] [place], [place] [address]    Culture, blood (Routine X 2) w Reflex to ID Panel     Status: None (Preliminary result)   Collection Time: 10/09/19  4:06 PM   Specimen: BLOOD  Result Value Ref Range Status   Specimen Description BLOOD LEFT ANTECUBITAL  Final   Special Requests   Final    BOTTLES DRAWN AEROBIC AND ANAEROBIC Blood Culture results may not be optimal due to an excessive volume of blood received in culture bottles   Culture   Final    NO GROWTH 2 DAYS Performed at [facility], [address] [place], [place] [address]    Report Status PENDING  Incomplete  Culture, blood (Routine X 2) w Reflex to ID Panel     Status: None (Preliminary result)   Collection Time: 10/09/19  7:22 PM   Specimen: BLOOD  Result Value Ref Range Status   Specimen Description BLOOD RIGHT ANTECUBITAL  Final   Special Requests   Final    BOTTLES DRAWN AEROBIC AND ANAEROBIC Blood Culture adequate volume   Culture   Final    NO GROWTH 2 DAYS Performed at [facility], [address], [place], [place] [address]    Report Status PENDING  Incomplete         Radiology Studies: CT ANGIO CHEST PE W OR WO CONTRAST  Result Date: 10/09/2019 CLINICAL DATA:  Shortness of breath. COVID-19 positive diagnosed on 10/03/2019 EXAM: CT ANGIOGRAPHY CHEST WITH CONTRAST TECHNIQUE: Multidetector CT imaging of the chest was performed using the standard protocol during bolus administration of intravenous contrast. Multiplanar CT image reconstructions and MIPs were obtained to evaluate the vascular anatomy. CONTRAST:  39mL OMNIPAQUE IOHEXOL 350 MG/ML SOLN COMPARISON:  Chest radiograph October 09, 2019, CT of the chest November 11, 2008 FINDINGS: Cardiovascular: Satisfactory opacification of the central pulmonary arteries. Suboptimal visualization of the segmental branches due to breathing motion artifact. No evidence of central pulmonary embolism. Normal heart size. No pericardial effusion. Mediastinum/Nodes: No enlarged mediastinal,  hilar, or axillary lymph nodes. Thyroid gland, trachea, and esophagus demonstrate no significant findings. Lungs/Pleura: Mild streaky peribronchial airspace opacities in the lower lobes. Upper Abdomen: No acute abnormality. Musculoskeletal: No chest wall abnormality. No acute or significant osseous findings. 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 Review of the MIP images  confirms the above findings. IMPRESSION: 1. No evidence of central pulmonary embolus. 2. Suboptimal visualization of the segmental pulmonary arterial branches due to breathing motion artifact. 3. Mild streaky peribronchial airspace opacities in the bilateral lower lobes, which may represent atelectasis or early viral pneumonia. Electronically Signed   By: Fidela Salisbury M.D.   On: 10/09/2019 13:25        Scheduled Meds: . vitamin C  500 mg Oral Daily  . atorvastatin  40 mg Oral Daily  . cyclobenzaprine  10 mg Oral BID  . dextromethorphan-guaiFENesin  1 tablet Oral BID  . ipratropium  2 puff Inhalation Q4H  . levothyroxine  88 mcg Oral Q0600  . lisinopril  5 mg Oral Daily  . methylPREDNISolone (SOLU-MEDROL) injection  40 mg Intravenous Q12H  . pantoprazole  40 mg Oral Daily  . primidone  50 mg Oral BID  . zinc sulfate  220 mg Oral Daily   Continuous Infusions: . remdesivir 100 mg in NS 100 mL 100 mg (10/11/19 0916)     LOS: 2 days    Time spent: 35 minutes    Sidney Ace, MD [facility] Pager [phone]  If 7PM-7AM, please contact night-coverage [email or web address] Password [email or web address] 10/11/2019, 11:05 AM

## 2019-10-11 NOTE — TOC Initial Note (Addendum)
Transition of Care [facility]) - Initial/Assessment Note    Patient Details  Name: Rebekah Salinas MRN: [id number] Date of Birth: 08-Jun-1956  Transition of Care [facility] CM/SW Contact:    Allayne Butcher, RN Phone Number: 10/11/2019, 10:30 AM  Clinical Narrative:                 Patient admitted with COVID requiring acute O2 at 2L via [place].  Patient is from home where she lives with her husband.  Husband also has COVID and is admitted to [facility].  Patient is independent at home and requires no assistive devices.  PT has recommended home health and patient agrees to home health services.  Patient does not have insurance, [facility] is on charity rotation this week.  Referral for home health PT given to Mellissa Kohut with [facility], she will call the patient in her room.  PT has also recommended 3 in 1, RNCM will see if one has been donated to the department.  Patient goes to the [facility] for primary care services and gets her prescriptions from [facility].  RNCM will cont to follow patient and assist with any additional discharge needs.   Expected Discharge Plan: Home w Home Health Services Barriers to Discharge: Continued Medical Work up   Patient Goals and CMS Choice Patient states their goals for this hospitalization and ongoing recovery are:: To not be coughing so bad CMS Medicare.gov Compare Post Acute Care list provided to:: Patient Choice offered to / list presented to : Patient  Expected Discharge Plan and Services Expected Discharge Plan: Home w Home Health Services   Discharge Planning Services: CM Consult Post Acute Care Choice: Home Health Living arrangements for the past 2 months: Apartment                           HH Arranged: PT HH Agency: [facility] ([facility]) Date HH Agency Contacted: 10/11/19 Time HH Agency Contacted: 1029 Representative spoke with at [facility] Agency: Mellissa Kohut  Prior Living Arrangements/Services Living  arrangements for the past 2 months: Apartment Lives with:: Spouse Patient language and need for interpreter reviewed:: Yes Do you feel safe going back to the place where you live?: Yes      Need for Family Participation in Patient Care: Yes (Comment)(COVID) Care giver support system in place?: Yes (comment)(husband)   Criminal Activity/Legal Involvement Pertinent to Current Situation/Hospitalization: No - Comment as needed  Activities of Daily Living Home Assistive Devices/Equipment: None ADL Screening (condition at time of admission) Patient's cognitive ability adequate to safely complete daily activities?: Yes Is the patient deaf or have difficulty hearing?: No Does the patient have difficulty seeing, even when wearing glasses/contacts?: No Does the patient have difficulty concentrating, remembering, or making decisions?: No Patient able to express need for assistance with ADLs?: Yes Does the patient have difficulty dressing or bathing?: No Independently performs ADLs?: Yes (appropriate for developmental age) Does the patient have difficulty walking or climbing stairs?: No Weakness of Legs: None Weakness of Arms/Hands: None  Permission Sought/Granted Permission sought to share information with : Case Manager, Other (comment) Permission granted to share information with : Yes, Verbal Permission Granted     Permission granted to share info w AGENCY: [facility]        Emotional Assessment   Attitude/Demeanor/Rapport: Engaged Affect (typically observed): Accepting Orientation: : Oriented to Self, Oriented to Place, Oriented to  Time, Oriented to Situation Alcohol /  Substance Use: Not Applicable Psych Involvement: No (comment)  Admission diagnosis:  Hypoxia [R09.02] Rectal bleed [K62.5] Acute respiratory disease due to COVID-19 virus [U07.1, J06.9] COVID-19 [U07.1] Patient Active Problem List   Diagnosis Date Noted  . Acute respiratory disease due to COVID-19 virus 10/09/2019   . HLD (hyperlipidemia) 10/09/2019  . Rectal bleeding 10/09/2019  . Prediabetes 03/15/2019  . Hypothyroidism 03/15/2019  . Essential hypertension 03/15/2019  . History of gastroesophageal reflux (GERD) 03/15/2019  . Benign head tremor 03/15/2019  . Mass of upper outer quadrant of left breast 03/15/2019  . Health care maintenance 03/15/2019  . Chest pain 06/02/2016   PCP:  Langston Reusing, NP Pharmacy:   [facility] [place], [place] - [address] [place] [address] Phone: [phone] Fax: [phone]  [facility] [place], [address] [place] [address] Phone: [phone] Fax: [phone]     Social Determinants of Health (SDOH) Interventions    Readmission Risk Interventions No flowsheet data found.

## 2019-10-12 DIAGNOSIS — R519 Headache, unspecified: Secondary | ICD-10-CM

## 2019-10-12 LAB — CBC WITH DIFFERENTIAL/PLATELET
Abs Immature Granulocytes: 0.1 10*3/uL — ABNORMAL HIGH (ref 0.00–0.07)
Basophils Absolute: 0 10*3/uL (ref 0.0–0.1)
Basophils Relative: 0 %
Eosinophils Absolute: 0 10*3/uL (ref 0.0–0.5)
Eosinophils Relative: 0 %
HCT: 36.7 % (ref 36.0–46.0)
Hemoglobin: 11.7 g/dL — ABNORMAL LOW (ref 12.0–15.0)
Immature Granulocytes: 1 %
Lymphocytes Relative: 8 %
Lymphs Abs: 0.7 10*3/uL (ref 0.7–4.0)
MCH: 27.3 pg (ref 26.0–34.0)
MCHC: 31.9 g/dL (ref 30.0–36.0)
MCV: 85.5 fL (ref 80.0–100.0)
Monocytes Absolute: 0.5 10*3/uL (ref 0.1–1.0)
Monocytes Relative: 5 %
Neutro Abs: 8.3 10*3/uL — ABNORMAL HIGH (ref 1.7–7.7)
Neutrophils Relative %: 86 %
Platelets: 482 10*3/uL — ABNORMAL HIGH (ref 150–400)
RBC: 4.29 MIL/uL (ref 3.87–5.11)
RDW: 14 % (ref 11.5–15.5)
WBC: 9.6 10*3/uL (ref 4.0–10.5)
nRBC: 0 % (ref 0.0–0.2)

## 2019-10-12 LAB — COMPREHENSIVE METABOLIC PANEL
ALT: 30 U/L (ref 0–44)
AST: 29 U/L (ref 15–41)
Albumin: 3.2 g/dL — ABNORMAL LOW (ref 3.5–5.0)
Alkaline Phosphatase: 50 U/L (ref 38–126)
Anion gap: 9 (ref 5–15)
BUN: 24 mg/dL — ABNORMAL HIGH (ref 8–23)
CO2: 28 mmol/L (ref 22–32)
Calcium: 8.9 mg/dL (ref 8.9–10.3)
Chloride: 101 mmol/L (ref 98–111)
Creatinine, Ser: 0.93 mg/dL (ref 0.44–1.00)
GFR calc Af Amer: >60 mL/min (ref >60)
GFR calc non Af Amer: >60 mL/min (ref >60)
Glucose, Bld: 171 mg/dL — ABNORMAL HIGH (ref 70–99)
Potassium: 4.2 mmol/L (ref 3.5–5.1)
Sodium: 138 mmol/L (ref 135–145)
Total Bilirubin: 0.6 mg/dL (ref 0.3–1.2)
Total Protein: 6.6 g/dL (ref 6.5–8.1)

## 2019-10-12 LAB — FERRITIN: Ferritin: 313 ng/mL — ABNORMAL HIGH (ref 11–307)

## 2019-10-12 LAB — FIBRIN DERIVATIVES D-DIMER (ARMC ONLY): Fibrin derivatives D-dimer (ARMC): 695.01 ng/mL (FEU) — ABNORMAL HIGH (ref 0.00–499.00)

## 2019-10-12 LAB — C-REACTIVE PROTEIN: CRP: 3.2 mg/dL — ABNORMAL HIGH (ref <1.0)

## 2019-10-12 LAB — MAGNESIUM: Magnesium: 2.4 mg/dL (ref 1.7–2.4)

## 2019-10-12 MED ORDER — KETOROLAC TROMETHAMINE 15 MG/ML IJ SOLN
15.0000 mg | Freq: Once | INTRAMUSCULAR | Status: AC
  Administered 2019-10-12: 15 mg via INTRAVENOUS
  Filled 2019-10-12: qty 1

## 2019-10-12 NOTE — Progress Notes (Signed)
Physical Therapy Treatment Patient Details Name: Rebekah Salinas MRN: [id number] DOB: Aug 12, 1956 Today's Date: 10/12/2019    History of Present Illness Pt is a 64 y.o. female presenting to hospital 10/08/18 with SOB, R sided chest pain, and rectal bleeding.  (+) COVID 10/03/19.  Pt admitted with acute respiratory disease d/t COVID-19 virus and chest pain.  PMH includes htn and prediabetes.    PT Comments    Pt received in bed and agreeable to PT. Pt received on RA with O2 sats mid to upper 90s. Pt mod I to get EOB and min guard for transfers and ambulation. Pt preferring to have single UE support of IV pole during ambulation ending with BUE support once fatigued. Pt required several standing rest breaks and cuing for PLB. Pt O2 sats dropped to mid 80s twice recovering quickly. Second half of ambulation O2 sats low to mid 90s however RR and HR increasing requiring more standing rest breaks. RR up to 30 and HR up to 120 once pt began getting fatigued. Upon return to room with pt sitting EOB pt reporting being very fatigued and feeling wiped out so further therex deferred. Pt vitals stable with O2 sats upper 90s, and RR ranging from 18-25 post ambulation. Pt returned to supine and still appearing to be SOB and increased work of breathing and having difficulty fully recovering from activity despite strong vitals and O2 sats upper 90s. Pt provided with 2L O2 via nasal cannula to facilitate quicker recovery and decrease work of breathing. Pt reported feeling fine when PT left room with all needs in reach. Nursing notified of pts response to ambulation and addition of supplemental O2 for brief period for recovery. Pt will continue to benefit from acute PT to further progress functional mobility and allow for return to PLOF. Home health remains appropriate following hospital dc.    Follow Up Recommendations  Home health PT     Equipment Recommendations  3in1 (PT)    Recommendations for Other Services        Precautions / Restrictions Precautions Precautions: Fall Restrictions Weight Bearing Restrictions: No    Mobility  Bed Mobility Overal bed mobility: Modified Independent             General bed mobility comments: mod I to get EOB and return to supine, no cuing required, pt utilizing increased time and bed rails  Transfers Overall transfer level: Needs assistance Equipment used: None Transfers: Sit to/from Stand Sit to Stand: Min guard         General transfer comment: min guard for safety with STS from slightly elevated bed, single UE support from bed rail  Ambulation/Gait Ambulation/Gait assistance: Min guard Gait Distance (Feet): 150 Feet Assistive device: IV Pole Gait Pattern/deviations: Step-through pattern;Decreased stride length Gait velocity: decreased   General Gait Details: pt ambulated out in hallway, pt utilizing IV pole for single UE support, end of ambulation when fatigue pt utilizing BUE support, pt steady with ambulation requiring several standing rest breaks and cuing for PLB, O2 sats dropped to mid 80s 2-3 times able to recover, most of ambulation O2 sats in 90s, HR up to 120, RR up to 30   Stairs             Wheelchair Mobility    Modified Rankin (Stroke Patients Only)       Balance   Sitting-balance support: No upper extremity supported;Feet supported Sitting balance-Leahy Scale: Normal Sitting balance - Comments: steady sitting reaching outside BOS   Standing balance  support: No upper extremity supported Standing balance-Leahy Scale: Fair Standing balance comment: mild unsteadiness with ambulation preferring to have single UE support of IV pole                            Cognition Arousal/Alertness: Awake/alert Behavior During Therapy: Flat affect Overall Cognitive Status: Within Functional Limits for tasks assessed                                        Exercises      General Comments General  comments (skin integrity, edema, etc.): end of session vitals stable with O2 sats 98%+ and RR 18-20 and HR in the 80s however pt presenting with increased WOB and appearing SOB and not recovering quickly so 2L O2 provided to decreased WOB and facilitate recovery with nursing notified      Pertinent Vitals/Pain Pain Assessment: No/denies pain    Home Living                      Prior Function            PT Goals (current goals can now be found in the care plan section) Progress towards PT goals: Progressing toward goals    Frequency    Min 2X/week      PT Plan Current plan remains appropriate    Co-evaluation              AM-PAC PT "6 Clicks" Mobility   Outcome Measure  Help needed turning from your back to your side while in a flat bed without using bedrails?: None Help needed moving from lying on your back to sitting on the side of a flat bed without using bedrails?: None Help needed moving to and from a bed to a chair (including a wheelchair)?: A Little Help needed standing up from a chair using your arms (e.g., wheelchair or bedside chair)?: A Little Help needed to walk in hospital room?: A Little Help needed climbing 3-5 steps with a railing? : A Lot 6 Click Score: 19    End of Session Equipment Utilized During Treatment: Gait belt Activity Tolerance: Patient limited by fatigue Patient left: in bed;with call bell/phone within reach Nurse Communication: Mobility status;Other (comment)(vitals during and post ambulation, O2 provided post ambulation) PT Visit Diagnosis: Other abnormalities of gait and mobility (R26.89);Muscle weakness (generalized) (M62.81);Difficulty in walking, not elsewhere classified (R26.2)     Time: 3546-5681 PT Time Calculation (min) (ACUTE ONLY): 25 min  Charges:  $Therapeutic Activity: 8-22 mins                     Rebekah Salinas PT, DPT 4:12 PM,10/12/19 [phone]    Rebekah Salinas Numbers 10/12/2019, 4:06 PM

## 2019-10-12 NOTE — Progress Notes (Signed)
PROGRESS NOTE                                                                                                                                                                                                             Patient Demographics:    Rebekah [place], is a 64 y.o. female, DOB - 29-Dec-1955, JSH:[id number]  Admit date - 10/09/2019   Admitting Physician Lorretta Harp, MD  Outpatient Primary MD for the patient is [place], Francoise Ceo, NP  LOS - 3    Chief Complaint  Patient presents with  . Shortness of Breath  . Chest Pain       Brief Narrative 64 year old female with hypertension, hyperlipidemia, prediabetes, GERD, hypothyroidism presented with shortness of breath, chest pain and rectal bleeding.  She was tested positive for COVID-19 on 10/03/2019, then developed shortness of breath with progressive worsening, associated dry cough with fevers and chills. She was found to be hypoxic with O2 sat of 88% on room air. Admitted for further management.   Subjective:    C/o severe headache and non productive cough. codein helps. Flat affect   Assessment  & Plan :    Principal Problem:   Acute respiratory disease due to COVID-19 virus On 2 L via nasal cannula this morning, wean to room air.  Remdesivir (day 4/5), Solu-Medrol 40 mg twice daily (day 4/10).  Continue vitamin C and zinc, empiric bronchodilators, Mucinex with codeine. Inflammatory markers improving. Patient feeling extremely weak and having headache and persistent cough.  Also unable to follow as outpatient for IV remdesivir infusion. Possibly discharge home tomorrow if stable on room air and completion of 5 days of IV remdesivir.  Active Problems: Rectal bleeding Hemoglobin stable.  Currently resolved. Possibly due to hemorrhoids.  Outpatient follow-up    Chest pain Pleuritic secondary to COVID-19 infection.  CT angiogram negative for PE.  Essential  hypertension Home meds held due to soft blood pressure.  Resume upon discharge  GERD Continue PPI  Hyperlipidemia/prediabetes Continue Lipitor.  Resume Metformin upon discharge.  CBG stable  Generalized weakness PT recommends home health.   Code Status : Full code  Family Communication  : We will update daughter  Disposition Plan  : Home possibly tomorrow if respiratory symptoms and weakness improved (also will complete 5 days of IV remdesivir as inpatient)  Barriers For Discharge :  Improving symptoms  Consults  : None  Procedures  : None  DVT Prophylaxis  : Subcu Lovenox  Lab Results  Component Value Date   PLT 482 (H) 10/12/2019    Antibiotics  :    Anti-infectives (From admission, onward)   Start     Dose/Rate Route Frequency Ordered Stop   10/10/19 1000  remdesivir 100 mg in sodium chloride 0.9 % 100 mL IVPB     100 mg 200 mL/hr over 30 Minutes Intravenous Daily 10/09/19 0902 10/14/19 0959   10/09/19 0930  remdesivir 200 mg in sodium chloride 0.9% 250 mL IVPB     200 mg 580 mL/hr over 30 Minutes Intravenous Once 10/09/19 0902 10/09/19 1111        Objective:   Vitals:   10/11/19 1700 10/11/19 1800 10/11/19 2319 10/12/19 0758  BP:   123/72 106/65  Pulse: 91 77  74  Resp: 16 20  (!) 26  Temp:   98.2 F (36.8 C) 98.6 F (37 C)  TempSrc:   Oral   SpO2: 94% 98%  100%  Weight:      Height:        Wt Readings from Last 3 Encounters:  10/09/19 93.9 kg  10/03/19 93.9 kg  09/29/19 93.9 kg     Intake/Output Summary (Last 24 hours) at 10/12/2019 0929 Last data filed at 10/11/2019 1921 Gross per 24 hour  Intake 300 ml  Output -  Net 300 ml     Physical Exam  Gen: not in distress, flat affect HEENT: moist mucosa, supple neck Chest: diminished b/l breath sounds CVS: N S1&S2, no murmurs GI: soft, NT, ND,  Musculoskeletal: warm, no edema     Data Review:    CBC Recent Labs  Lab 10/09/19 0817 10/09/19 1455 10/09/19 1606 10/10/19 0020  10/10/19 0606 10/11/19 0328 10/12/19 0400  WBC 8.1   < > 9.0 7.2 5.6 8.4 9.6  HGB 12.2   < > 13.0 12.3 12.1 12.0 11.7*  HCT 37.5   < > 40.0 38.4 37.7 37.2 36.7  PLT 291   < > 342 334 373 428* 482*  MCV 85.0   < > 84.9 85.9 85.7 85.1 85.5  MCH 27.7   < > 27.6 27.5 27.5 27.5 27.3  MCHC 32.5   < > 32.5 32.0 32.1 32.3 31.9  RDW 13.8   < > 13.9 14.0 14.0 14.0 14.0  LYMPHSABS 0.9  --   --   --   --  0.7 0.7  MONOABS 0.3  --   --   --   --  0.4 0.5  EOSABS 0.0  --   --   --   --  0.0 0.0  BASOSABS 0.0  --   --   --   --  0.0 0.0   < > = values in this interval not displayed.    Chemistries  Recent Labs  Lab 10/09/19 0817 10/09/19 1124 10/10/19 0606 10/11/19 0328 10/12/19 0400  NA 134*  --  138 137 138  K 3.6  --  3.9 4.0 4.2  CL 98  --  101 100 101  CO2 26  --  24 26 28   GLUCOSE 112*  --  136* 159* 171*  BUN 11  --  17 23 24*  CREATININE 1.04*  --  0.93 1.02* 0.93  CALCIUM 8.4*  --  8.6* 8.8* 8.9  MG  --   --  2.4 2.5* 2.4  AST  --  40 36 34 29  ALT  --  25 27 29 30   ALKPHOS  --  53 52 53 50  BILITOT  --  0.9 0.8 0.7 0.6   ------------------------------------------------------------------------------------------------------------------ Recent Labs    10/09/19 1603 10/10/19 0606  CHOL  --  83  HDL  --  24*  LDLCALC  --  48  TRIG 72 53  CHOLHDL  --  3.5    Lab Results  Component Value Date   HGBA1C 6.3 (H) 10/10/2019   ------------------------------------------------------------------------------------------------------------------ No results for input(s): TSH, T4TOTAL, T3FREE, THYROIDAB in the last 72 hours.  Invalid input(s): FREET3 ------------------------------------------------------------------------------------------------------------------ Recent Labs    10/11/19 0328 10/12/19 0400  FERRITIN 453* 313*    Coagulation profile No results for input(s): INR, PROTIME in the last 168 hours.  No results for input(s): DDIMER in the last 72 hours.   Cardiac Enzymes No results for input(s): CKMB, TROPONINI, MYOGLOBIN in the last 168 hours.  Invalid input(s): CK ------------------------------------------------------------------------------------------------------------------    Component Value Date/Time   BNP 34.0 10/09/2019 1603    Inpatient Medications  Scheduled Meds: . vitamin C  500 mg Oral Daily  . atorvastatin  40 mg Oral Daily  . cyclobenzaprine  10 mg Oral BID  . dextromethorphan-guaiFENesin  1 tablet Oral BID  . ipratropium  2 puff Inhalation Q4H  . ketorolac  15 mg Intravenous Once  . levothyroxine  88 mcg Oral Q0600  . lisinopril  5 mg Oral Daily  . methylPREDNISolone (SOLU-MEDROL) injection  40 mg Intravenous Q12H  . pantoprazole  40 mg Oral Daily  . primidone  50 mg Oral BID  . zinc sulfate  220 mg Oral Daily   Continuous Infusions: . remdesivir 100 mg in NS 100 mL 100 mg (10/12/19 0915)   PRN Meds:.acetaminophen, albuterol, guaiFENesin-codeine, hydrALAZINE, ondansetron (ZOFRAN) IV, sodium chloride  Micro Results Recent Results (from the past 240 hour(s))  Novel Coronavirus, NAA (Hosp order, Send-out to Ref Lab; TAT 18-24 hrs     Status: Abnormal   Collection Time: 10/03/19  4:09 PM   Specimen: Nasopharyngeal Swab; Respiratory  Result Value Ref Range Status   SARS-CoV-2, NAA DETECTED (A) NOT DETECTED Final    Comment: [facility] notified 10/07/2019 at 1506 SWB. (NOTE)                  Client Requested Flag This nucleic acid amplification test was developed and its performance characteristics determined by 10/09/2019. Nucleic acid amplification tests include PCR and TMA. This test has not been FDA cleared or approved. This test has been authorized by FDA under [place] Emergency Use Authorization (EUA). This test is only authorized for the duration of time the declaration that circumstances exist justifying the authorization of the emergency use of in vitro diagnostic tests for detection of  SARS-CoV-2 virus and/or diagnosis of COVID-19 infection under section 564(b)(1) of the Act, 21 U.S.C. [employer]) (1), unless the authorization is terminated or revoked sooner. When diagnostic testing is negative, the possibility of a false negative result should be considered in the context of a patient's recent exposures and the presence of clinical signs and symptoms consistent with  COVID-19. [place] individual without symptoms of COVID- 19 and who is not shedding SARS-CoV-2 virus would expect to have a negative (not detected) result in this assay. Performed At: [facility] [address] [place], [place] [id number] MD Jolene Schimke    Coronavirus Source NASOPHARYNGEAL  Final    Comment: Performed at [facility]  Lab, [address] [place], [place] [address]  Culture, blood (Routine X 2) w Reflex to ID Panel     Status: None (Preliminary result)   Collection Time: 10/09/19  4:06 PM   Specimen: BLOOD  Result Value Ref Range Status   Specimen Description BLOOD LEFT ANTECUBITAL  Final   Special Requests   Final    BOTTLES DRAWN AEROBIC AND ANAEROBIC Blood Culture results may not be optimal due to [place] excessive volume of blood received in culture bottles   Culture   Final    NO GROWTH 3 DAYS Performed at [facility], [address], [place], [place] [address]    Report Status PENDING  Incomplete  Culture, blood (Routine X 2) w Reflex to ID Panel     Status: None (Preliminary result)   Collection Time: 10/09/19  7:22 PM   Specimen: BLOOD  Result Value Ref Range Status   Specimen Description BLOOD RIGHT ANTECUBITAL  Final   Special Requests   Final    BOTTLES DRAWN AEROBIC AND ANAEROBIC Blood Culture adequate volume   Culture   Final    NO GROWTH 3 DAYS Performed at [facility], [address] [place], [place] [address]    Report Status PENDING  Incomplete    Radiology Reports CT ANGIO CHEST PE W OR WO CONTRAST  Result Date:  10/09/2019 CLINICAL DATA:  Shortness of breath. COVID-19 positive diagnosed on 10/03/2019 EXAM: CT ANGIOGRAPHY CHEST WITH CONTRAST TECHNIQUE: Multidetector CT imaging of the chest was performed using the standard protocol during bolus administration of intravenous contrast. Multiplanar CT image reconstructions and MIPs were obtained to evaluate the vascular anatomy. CONTRAST:  68mL OMNIPAQUE IOHEXOL 350 MG/ML SOLN COMPARISON:  Chest radiograph October 09, 2019, CT of the chest November 11, 2008 FINDINGS: Cardiovascular: Satisfactory opacification of the central pulmonary arteries. Suboptimal visualization of the segmental branches due to breathing motion artifact. No evidence of central pulmonary embolism. Normal heart size. No pericardial effusion. Mediastinum/Nodes: No enlarged mediastinal, hilar, or axillary lymph nodes. Thyroid gland, trachea, and esophagus demonstrate no significant findings. Lungs/Pleura: Mild streaky peribronchial airspace opacities in the lower lobes. Upper Abdomen: No acute abnormality. Musculoskeletal: No chest wall abnormality. No acute or significant osseous findings. 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 Review of the MIP images confirms the above findings. IMPRESSION: 1. No evidence of central pulmonary embolus. 2. Suboptimal visualization of the segmental pulmonary arterial branches due to breathing motion artifact. 3. Mild streaky peribronchial airspace opacities in the bilateral lower lobes, which may represent atelectasis or early viral pneumonia. Electronically Signed   By: Fidela [place] M.D.   On: 10/09/2019 13:25   DG Chest Portable 1 View  Result Date: 10/09/2019 CLINICAL DATA:  Patient positive for covid on 10-03-19 c/o increasing sob and right sided chest pressuresob/covid pos EXAM: PORTABLE CHEST 1 VIEW COMPARISON:  06/02/2016 FINDINGS: Normal cardiac silhouette. Low lung volumes. There is streaky airspace opacities in the RIGHT  lower lobe and LEFT lower lobe. Upper lungs are clear. No pneumothorax. IMPRESSION: 1. Low lung volumes. 2. Streaky airspace opacities in lower lobes concerning for COVID pneumonia. Electronically Signed   By: Suzy Bouchard M.D.   On: 10/09/2019 08:57    Time Spent in minutes 25   Promise Bushong M.D on 10/12/2019 at 9:29 AM  Between 7am to 7pm - Pager - [phone]  After 7pm go to [email or web address] - password [facility]  [facility] -  Office  [phone]

## 2019-10-13 DIAGNOSIS — K625 Hemorrhage of anus and rectum: Secondary | ICD-10-CM

## 2019-10-13 DIAGNOSIS — R072 Precordial pain: Secondary | ICD-10-CM

## 2019-10-13 DIAGNOSIS — R7303 Prediabetes: Secondary | ICD-10-CM

## 2019-10-13 DIAGNOSIS — J069 Acute upper respiratory infection, unspecified: Secondary | ICD-10-CM

## 2019-10-13 DIAGNOSIS — I10 Essential (primary) hypertension: Secondary | ICD-10-CM

## 2019-10-13 DIAGNOSIS — U071 COVID-19: Principal | ICD-10-CM

## 2019-10-13 LAB — COMPREHENSIVE METABOLIC PANEL
ALT: 27 U/L (ref 0–44)
AST: 22 U/L (ref 15–41)
Albumin: 3.1 g/dL — ABNORMAL LOW (ref 3.5–5.0)
Alkaline Phosphatase: 53 U/L (ref 38–126)
Anion gap: 9 (ref 5–15)
BUN: 28 mg/dL — ABNORMAL HIGH (ref 8–23)
CO2: 28 mmol/L (ref 22–32)
Calcium: 8.6 mg/dL — ABNORMAL LOW (ref 8.9–10.3)
Chloride: 104 mmol/L (ref 98–111)
Creatinine, Ser: 1.04 mg/dL — ABNORMAL HIGH (ref 0.44–1.00)
GFR calc Af Amer: >60 mL/min (ref >60)
GFR calc non Af Amer: 58 mL/min — ABNORMAL LOW (ref >60)
Glucose, Bld: 179 mg/dL — ABNORMAL HIGH (ref 70–99)
Potassium: 4.3 mmol/L (ref 3.5–5.1)
Sodium: 141 mmol/L (ref 135–145)
Total Bilirubin: 0.8 mg/dL (ref 0.3–1.2)
Total Protein: 6.6 g/dL (ref 6.5–8.1)

## 2019-10-13 LAB — CBC WITH DIFFERENTIAL/PLATELET
Abs Immature Granulocytes: 0.39 10*3/uL — ABNORMAL HIGH (ref 0.00–0.07)
Basophils Absolute: 0 10*3/uL (ref 0.0–0.1)
Basophils Relative: 0 %
Eosinophils Absolute: 0 10*3/uL (ref 0.0–0.5)
Eosinophils Relative: 0 %
HCT: 37.4 % (ref 36.0–46.0)
Hemoglobin: 12 g/dL (ref 12.0–15.0)
Immature Granulocytes: 4 %
Lymphocytes Relative: 9 %
Lymphs Abs: 0.9 10*3/uL (ref 0.7–4.0)
MCH: 27.6 pg (ref 26.0–34.0)
MCHC: 32.1 g/dL (ref 30.0–36.0)
MCV: 86 fL (ref 80.0–100.0)
Monocytes Absolute: 0.6 10*3/uL (ref 0.1–1.0)
Monocytes Relative: 7 %
Neutro Abs: 7.3 10*3/uL (ref 1.7–7.7)
Neutrophils Relative %: 80 %
Platelets: 507 10*3/uL — ABNORMAL HIGH (ref 150–400)
RBC: 4.35 MIL/uL (ref 3.87–5.11)
RDW: 13.9 % (ref 11.5–15.5)
WBC: 9.2 10*3/uL (ref 4.0–10.5)
nRBC: 0 % (ref 0.0–0.2)

## 2019-10-13 LAB — MAGNESIUM: Magnesium: 2.5 mg/dL — ABNORMAL HIGH (ref 1.7–2.4)

## 2019-10-13 LAB — C-REACTIVE PROTEIN: CRP: 1.7 mg/dL — ABNORMAL HIGH (ref <1.0)

## 2019-10-13 LAB — FERRITIN: Ferritin: 274 ng/mL (ref 11–307)

## 2019-10-13 LAB — FIBRIN DERIVATIVES D-DIMER (ARMC ONLY): Fibrin derivatives D-dimer (ARMC): 902.53 ng/mL (FEU) — ABNORMAL HIGH (ref 0.00–499.00)

## 2019-10-13 MED ORDER — PREDNISONE 20 MG PO TABS: 40.0000 mg | ORAL_TABLET | Freq: Every day | ORAL | 0 refills | Status: AC

## 2019-10-13 MED ORDER — GUAIFENESIN-CODEINE 100-10 MG/5ML PO SOLN: 10.0000 mL | ORAL | 0 refills | Status: DC | PRN

## 2019-10-13 MED ORDER — ZINC SULFATE 220 (50 ZN) MG PO CAPS: 220.0000 mg | ORAL_CAPSULE | Freq: Every day | ORAL | 0 refills | Status: DC

## 2019-10-13 MED ORDER — DM-GUAIFENESIN ER 30-600 MG PO TB12: 1.0000 | ORAL_TABLET | Freq: Two times a day (BID) | ORAL | 0 refills | Status: DC

## 2019-10-13 MED ORDER — ASCORBIC ACID 500 MG PO TABS: 500.0000 mg | ORAL_TABLET | Freq: Every day | ORAL | 0 refills | Status: DC

## 2019-10-13 NOTE — Discharge Summary (Signed)
Physician Discharge Summary  Rebekah Salinas RKY:[id number] DOB: [birth year] DOA: 10/09/2019  PCP: Rolm Gala, NP  Admit date: 10/09/2019 Discharge date: 10/13/2019  Admitted From: Home Disposition: Home  Recommendations for Outpatient Follow-up:  Follow-up with PCP in 1-2 weeks.  Patient has completed 5 days of IV remdesivir today and will be discharged on oral prednisone to complete 10-day course.  Home Health: RN and PT Equipment/Devices: None  Discharge condition: Fair,  CODE STATUS: Full code Diet recommendation: Carb modified    Discharge Diagnoses:  Principal Problem:   Acute respiratory disease due to COVID-19 virus  Active Problems:   Chest pain   Prediabetes   Hypothyroidism   Essential hypertension   History of gastroesophageal reflux (GERD)   HLD (hyperlipidemia)   Rectal bleeding  Brief narrative/HPI 64 year old female with hypertension, hyperlipidemia, prediabetes, GERD, hypothyroidism presented with shortness of breath, chest pain and rectal bleeding.  She was tested positive for COVID-19 on 10/03/2019, then developed shortness of breath with progressive worsening, associated dry cough with fevers and chills. She was found to be hypoxic with O2 sat of 88% on room air. Admitted for further management.  Hospital course  Principal Problem:   Acute respiratory disease due to COVID-19 virus Completed 5 days of remdesivir today.  Placed on Solu-Medrol and will be discharged on oral prednisone for total 10-day course. Continue vitamin C and zinc, empiric bronchodilators, Mucinex with codeine. Inflammatory markers improving.  Patient does not have further respiratory symptoms but has generalized weakness.  Oxygen weaned and stable on room air. Patient stable to be discharged home.  Home health instructions including monitoring of any worsening of her respiratory symptoms, self quarantine and outpatient follow-up instructions provided.  Active  Problems: Rectal bleeding Hemoglobin stable.  Currently resolved. Possibly due to hemorrhoids.  Outpatient follow-up    Chest pain Pleuritic secondary to COVID-19 infection.  CT angiogram negative for PE.  Resolved  Essential hypertension Home meds held due to soft blood pressure.  Now improved and resumed on discharge.  GERD Continue PPI  Hyperlipidemia/prediabetes Continue Lipitor.  Resume Metformin upon discharge.  CBG stable  Generalized weakness PT recommends home health.    Family Communication  : Daughter updated on the phone  Disposition Plan  :  Home  Consults  : None  Procedures  : CT angiogram of the chest  Discharge Instructions   Allergies as of 10/13/2019      Reactions   Aspirin Nausea And Vomiting      Medication List    TAKE these medications   acetaminophen 325 MG tablet Commonly known as: TYLENOL Take 650 mg by mouth every 6 (six) hours as needed.   ascorbic acid 500 MG tablet Commonly known as: VITAMIN C Take 1 tablet (500 mg total) by mouth daily.   atorvastatin 40 MG tablet Commonly known as: LIPITOR Take 1 tablet (40 mg total) by mouth daily.   cyclobenzaprine 10 MG tablet Commonly known as: FLEXERIL Take 1 tablet (10 mg total) by mouth 2 (two) times daily.   dextromethorphan-guaiFENesin 30-600 MG 12hr tablet Commonly known as: MUCINEX DM Take 1 tablet by mouth 2 (two) times daily.   guaiFENesin-codeine 100-10 MG/5ML syrup Take 10 mLs by mouth every 4 (four) hours as needed for cough.   levothyroxine 88 MCG tablet Commonly known as: SYNTHROID TAKE ONE TABLET BY MOUTH EVERY DAY BEFORE BREAKFAST. What changed:   how much to take  how to take this  when to take this   lisinopril 5 MG  tablet Commonly known as: ZESTRIL Take 1 tablet (5 mg total) by mouth daily.   metFORMIN 500 MG tablet Commonly known as: GLUCOPHAGE TAKE TWO TABLETS (1000MG ) BY MOUTH EVERY DAY WITH BREAKFAST.   omeprazole 20 MG  capsule Commonly known as: PRILOSEC Take 1 capsule (20 mg total) by mouth daily.   predniSONE 20 MG tablet Commonly known as: DELTASONE Take 2 tablets (40 mg total) by mouth daily for 5 days. Start taking on: October 14, 2019   primidone 50 MG tablet Commonly known as: MYSOLINE TAKE ONE TABLET BY MOUTH 2 TIMES A DAY What changed:   how much to take  how to take this  when to take this   zinc sulfate 220 (50 Zn) MG capsule Take 1 capsule (220 mg total) by mouth daily.      Follow-up Information    Iloabachie, Chioma E, NP. Schedule an appointment as soon as possible for a visit in 1 week(s).   Specialty: Gerontology Contact information: [address] [place] [phone]          Allergies  Allergen Reactions  . Aspirin Nausea And Vomiting      Procedures/Studies: CT ANGIO CHEST PE W OR WO CONTRAST  Result Date: 10/09/2019 CLINICAL DATA:  Shortness of breath. COVID-19 positive diagnosed on 10/03/2019 EXAM: CT ANGIOGRAPHY CHEST WITH CONTRAST TECHNIQUE: Multidetector CT imaging of the chest was performed using the standard protocol during bolus administration of intravenous contrast. Multiplanar CT image reconstructions and MIPs were obtained to evaluate the vascular anatomy. CONTRAST:  35mL OMNIPAQUE IOHEXOL 350 MG/ML SOLN COMPARISON:  Chest radiograph October 09, 2019, CT of the chest November 11, 2008 FINDINGS: Cardiovascular: Satisfactory opacification of the central pulmonary arteries. Suboptimal visualization of the segmental branches due to breathing motion artifact. No evidence of central pulmonary embolism. Normal heart size. No pericardial effusion. Mediastinum/Nodes: No enlarged mediastinal, hilar, or axillary lymph nodes. Thyroid gland, trachea, and esophagus demonstrate no significant findings. Lungs/Pleura: Mild streaky peribronchial airspace opacities in the lower lobes. Upper Abdomen: No acute abnormality. Musculoskeletal: No  chest wall abnormality. No acute or significant osseous findings. 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 Review of the MIP images confirms the above findings. IMPRESSION: 1. No evidence of central pulmonary embolus. 2. Suboptimal visualization of the segmental pulmonary arterial branches due to breathing motion artifact. 3. Mild streaky peribronchial airspace opacities in the bilateral lower lobes, which may represent atelectasis or early viral pneumonia. Electronically Signed   By: November 13, 2008 M.D.   On: 10/09/2019 13:25   DG Chest Portable 1 View  Result Date: 10/09/2019 CLINICAL DATA:  Patient positive for covid on 10-03-19 c/o increasing sob and right sided chest pressuresob/covid pos EXAM: PORTABLE CHEST 1 VIEW COMPARISON:  06/02/2016 FINDINGS: Normal cardiac silhouette. Low lung volumes. There is streaky airspace opacities in the RIGHT lower lobe and LEFT lower lobe. Upper lungs are clear. No pneumothorax. IMPRESSION: 1. Low lung volumes. 2. Streaky airspace opacities in lower lobes concerning for COVID pneumonia. Electronically Signed   By: 08/02/2016 M.D.   On: 10/09/2019 08:57       Subjective: Feels better.  Still has flat affect but denies any breathing difficulty.  Cough improving.  Discharge Exam: Vitals:   10/12/19 2359 10/13/19 0749  BP:  (!) 101/56  Pulse:  73  Resp: 18 18  Temp: 98.3 F (36.8 C) 98.3 F (36.8 C)  SpO2:  96%   Vitals:   10/12/19 1700  10/12/19 1800 10/12/19 2359 10/13/19 0749  BP: (!) 99/51 (!) 103/59  (!) 101/56  Pulse: 76 72  73  Resp: (!) 22 17 18 18   Temp:   98.3 F (36.8 C) 98.3 F (36.8 C)  TempSrc:   Oral Oral  SpO2: (!) 86% 97%  96%  Weight:      Height:        Middle-aged female not in distress HEENT: Mucosa, supple neck Chest: Clear CVS: Normal S1-S2 GI: Soft, nondistended, nontender Musculoskeletal: Warm, no edema   The results of significant diagnostics from this hospitalization  (including imaging, microbiology, ancillary and laboratory) are listed below for reference.     Microbiology: Recent Results (from the past 240 hour(s))  Novel Coronavirus, NAA (Hosp order, Send-out to Ref Lab; TAT 18-24 hrs     Status: Abnormal   Collection Time: 10/03/19  4:09 PM   Specimen: Nasopharyngeal Swab; Respiratory  Result Value Ref Range Status   SARS-CoV-2, NAA DETECTED (A) NOT DETECTED Final    Comment: 12/01/19 [facility] ED notified 10/07/2019 at 1506 SWB. (NOTE)                  Client Requested Flag This nucleic acid amplification test was developed and its performance characteristics determined by 10/09/2019. Nucleic acid amplification tests include PCR and TMA. This test has not been FDA cleared or approved. This test has been authorized by FDA under an Emergency Use Authorization (EUA). This test is only authorized for the duration of time the declaration that circumstances exist justifying the authorization of the emergency use of in vitro diagnostic tests for detection of SARS-CoV-2 virus and/or diagnosis of COVID-19 infection under section 564(b)(1) of the Act, 21 U.S.C. [employer]) (1), unless the authorization is terminated or revoked sooner. When diagnostic testing is negative, the possibility of a false negative result should be considered in the context of a patient's recent exposures and the presence of clinical signs and symptoms consistent with  COVID-19. An individual without symptoms of COVID- 19 and who is not shedding SARS-CoV-2 virus would expect to have a negative (not detected) result in this assay. Performed At: [facility] [address] [place], [place] [id number] MD Jolene Schimke    Coronavirus Source NASOPHARYNGEAL  Final    Comment: Performed at [facility], [address] [place], [place]  Culture, blood (Routine X 2) w Reflex to ID Panel     Status: None (Preliminary result)    Collection Time: 10/09/19  4:06 PM   Specimen: BLOOD  Result Value Ref Range Status   Specimen Description BLOOD LEFT ANTECUBITAL  Final   Special Requests   Final    BOTTLES DRAWN AEROBIC AND ANAEROBIC Blood Culture results may not be optimal due to an excessive volume of blood received in culture bottles   Culture   Final    NO GROWTH 4 DAYS Performed at [facility], [address], [place], [place]    Report Status PENDING  Incomplete  Culture, blood (Routine X 2) w Reflex to ID Panel     Status: None (Preliminary result)   Collection Time: 10/09/19  7:22 PM   Specimen: BLOOD  Result Value Ref Range Status   Specimen Description BLOOD RIGHT ANTECUBITAL  Final   Special Requests   Final    BOTTLES DRAWN AEROBIC AND ANAEROBIC Blood Culture adequate volume   Culture   Final    NO GROWTH 4 DAYS Performed at [facility]  [facility], [address] [place], [place] [address]    Report Status PENDING  Incomplete     Labs: BNP (last 3 results) Recent Labs    10/09/19 1603  BNP 34.0   Basic Metabolic Panel: Recent Labs  Lab 10/09/19 0817 10/10/19 0606 10/11/19 0328 10/12/19 0400 10/13/19 0346  NA 134* 138 137 138 141  K 3.6 3.9 4.0 4.2 4.3  CL 98 101 100 101 104  CO2 26 24 26 28 28   GLUCOSE 112* 136* 159* 171* 179*  BUN 11 17 23  24* 28*  CREATININE 1.04* 0.93 1.02* 0.93 1.04*  CALCIUM 8.4* 8.6* 8.8* 8.9 8.6*  MG  --  2.4 2.5* 2.4 2.5*   Liver Function Tests: Recent Labs  Lab 10/09/19 1124 10/10/19 0606 10/11/19 0328 10/12/19 0400 10/13/19 0346  AST 40 36 34 29 22  ALT 25 27 29 30 27   ALKPHOS 53 52 53 50 53  BILITOT 0.9 0.8 0.7 0.6 0.8  PROT 6.7 7.1 7.0 6.6 6.6  ALBUMIN 3.2* 3.2* 3.2* 3.2* 3.1*   No results for input(s): LIPASE, AMYLASE in the last 168 hours. No results for input(s): AMMONIA in the last 168 hours. CBC: Recent Labs  Lab 10/09/19 0817 10/09/19 1455 10/10/19 0020 10/10/19 0606 10/11/19 0328 10/12/19 0400  10/13/19 0346  WBC 8.1   < > 7.2 5.6 8.4 9.6 9.2  NEUTROABS 6.9  --   --   --  7.3 8.3* 7.3  HGB 12.2   < > 12.3 12.1 12.0 11.7* 12.0  HCT 37.5   < > 38.4 37.7 37.2 36.7 37.4  MCV 85.0   < > 85.9 85.7 85.1 85.5 86.0  PLT 291   < > 334 373 428* 482* 507*   < > = values in this interval not displayed.   Cardiac Enzymes: No results for input(s): CKTOTAL, CKMB, CKMBINDEX, TROPONINI in the last 168 hours. BNP: Invalid input(s): POCBNP CBG: Recent Labs  Lab 10/10/19 0736 10/11/19 0722  GLUCAP 121* 148*   D-Dimer No results for input(s): DDIMER in the last 72 hours. Hgb A1c No results for input(s): HGBA1C in the last 72 hours. Lipid Profile No results for input(s): CHOL, HDL, LDLCALC, TRIG, CHOLHDL, LDLDIRECT in the last 72 hours. Thyroid function studies No results for input(s): TSH, T4TOTAL, T3FREE, THYROIDAB in the last 72 hours.  Invalid input(s): FREET3 Anemia work up Recent Labs    10/12/19 0400 10/13/19 0346  FERRITIN 313* 274   Urinalysis    Component Value Date/Time   COLORURINE YELLOW 11/13/2008 1506   APPEARANCEUR Clear 12/07/2018 1450   LABSPEC 1.009 11/13/2008 1506   PHURINE 6.0 11/13/2008 1506   GLUCOSEU Negative 12/07/2018 1450   HGBUR NEGATIVE 11/13/2008 1506   BILIRUBINUR Negative 12/07/2018 1450   KETONESUR NEGATIVE 11/13/2008 1506   PROTEINUR Negative 12/07/2018 1450   PROTEINUR NEGATIVE 11/13/2008 1506   UROBILINOGEN 0.2 11/13/2008 1506   NITRITE Negative 12/07/2018 1450   NITRITE NEGATIVE 11/13/2008 1506   LEUKOCYTESUR Negative 12/07/2018 1450   Sepsis Labs Invalid input(s): PROCALCITONIN,  WBC,  LACTICIDVEN Microbiology Recent Results (from the past 240 hour(s))  Novel Coronavirus, NAA (Hosp order, Send-out to [facility]; TAT 18-24 hrs     Status: Abnormal   Collection Time: 10/03/19  4:09 PM   Specimen: Nasopharyngeal Swab; Respiratory  Result Value Ref Range Status   SARS-CoV-2, NAA DETECTED (A) NOT DETECTED Final    Comment: Tonia GhentJane Ryan  [facility] notified 10/07/2019 at 1506 SWB. (NOTE)  Client Requested Flag This nucleic acid amplification test was developed and its performance characteristics determined by [employer]. Nucleic acid amplification tests include PCR and TMA. This test has not been FDA cleared or approved. This test has been authorized by FDA under an Emergency Use Authorization (EUA). This test is only authorized for the duration of time the declaration that circumstances exist justifying the authorization of the emergency use of in vitro diagnostic tests for detection of SARS-CoV-2 virus and/or diagnosis of COVID-19 infection under section 564(b)(1) of the Act, 21 U.S.C. 160VPX-1(G) (1), unless the authorization is terminated or revoked sooner. When diagnostic testing is negative, the possibility of a false negative result should be considered in the context of a patient's recent exposures and the presence of clinical signs and symptoms consistent with  COVID-19. An individual without symptoms of COVID- 19 and who is not shedding SARS-CoV-2 virus would expect to have a negative (not detected) result in this assay. Performed At: [facility] [address] [place], [place] [id number] Rush Farmer MD EV:[id number]    [place]  Final    Comment: Performed at [facility], [address] [place], [place] [address]  Culture, blood (Routine X 2) w Reflex to ID Panel     Status: None (Preliminary result)   Collection Time: 10/09/19  4:06 PM   Specimen: BLOOD  Result Value Ref Range Status   Specimen Description BLOOD LEFT ANTECUBITAL  Final   Special Requests   Final    BOTTLES DRAWN AEROBIC AND ANAEROBIC Blood Culture results may not be optimal due to an excessive volume of blood received in culture bottles   Culture   Final    NO GROWTH 4 DAYS Performed at [facility], [address] [place], [place] [address]    Report  Status PENDING  Incomplete  Culture, blood (Routine X 2) w Reflex to ID Panel     Status: None (Preliminary result)   Collection Time: 10/09/19  7:22 PM   Specimen: BLOOD  Result Value Ref Range Status   Specimen Description BLOOD RIGHT ANTECUBITAL  Final   Special Requests   Final    BOTTLES DRAWN AEROBIC AND ANAEROBIC Blood Culture adequate volume   Culture   Final    NO GROWTH 4 DAYS Performed at [facility], [address], [place], [place] [address]    Report Status PENDING  Incomplete     Time coordinating discharge: 35 minutes  SIGNED:   Louellen Molder, MD  [facility] 10/13/2019, 9:58 AM Pager   If 7PM-7AM, please contact night-coverage [email or web address] Password [email or web address]

## 2019-10-13 NOTE — TOC Transition Note (Signed)
Transition of Care [facility]) - CM/SW Discharge Note   Patient Details  Name: Rebekah Salinas MRN: [id number] Date of Birth: 29-Apr-1956  Transition [facility]) CM/SW Contact:  Allayne Butcher, RN Phone Number: 10/13/2019, 10:47 AM   Clinical Narrative:    Patient is medically ready for discharge and will discharge home today.  Home Health has been arranged with [facility], Mellissa Kohut with [facility] is aware of discharge today.  3 in 1 bedside commode has been delivered to the patient's room donated by the [facility].  Patient's daughter is aware of discharge and reports that one of her children will probably be picking the patient up when ready for discharge home.    Final next level of care: Home w Home Health Services Barriers to Discharge: Barriers Resolved   Patient Goals and CMS Choice Patient states their goals for this hospitalization and ongoing recovery are:: To not be coughing so bad CMS Medicare.gov Compare Post Acute Care list provided to:: Patient Choice offered to / list presented to : Patient  Discharge Placement                       Discharge Plan and Services   Discharge Planning Services: CM Consult Post Acute Care Choice: Home Health          DME Arranged: 3-N-1         HH Arranged: RN, PT HH Agency: [facility] (now [facility]) Date HH Agency Contacted: 10/13/19 Time HH Agency Contacted: 1043 Representative spoke with at [facility] Agency: Mellissa Kohut  Social Determinants of Health (SDOH) Interventions     Readmission Risk Interventions No flowsheet data found.

## 2019-10-13 NOTE — Discharge Instructions (Signed)
COVID-19: How to Protect Yourself and Others °Know how it spreads °· There is currently no vaccine to prevent coronavirus disease 2019 (COVID-19). °· The best way to prevent illness is to avoid being exposed to this virus. °· The virus is thought to spread mainly from person-to-person. °? Between people who are in close contact with one another (within about 6 feet). °? Through respiratory droplets produced when an infected person coughs, sneezes or talks. °? These droplets can land in the mouths or noses of people who are nearby or possibly be inhaled into the lungs. °? COVID-19 may be spread by people who are not showing symptoms. °Everyone should °Clean your hands often °· Wash your hands often with soap and water for at least 20 seconds especially after you have been in a public place, or after blowing your nose, coughing, or sneezing. °· If soap and water are not readily available, use a hand sanitizer that contains at least 60% alcohol. Cover all surfaces of your hands and rub them together until they feel dry. °· Avoid touching your eyes, nose, and mouth with unwashed hands. °Avoid close contact °· Limit contact with others as much as possible. °· Avoid close contact with people who are sick. °· Put distance between yourself and other people. °? Remember that some people without symptoms may be able to spread virus. °? This is especially important for people who are at higher risk of getting very sick.www.cdc.gov/coronavirus/2019-ncov/need-extra-precautions/people-at-higher-risk.html °Cover your mouth and nose with a mask when around others °· You could spread COVID-19 to others even if you do not feel sick. °· Everyone should wear a mask in public settings and when around people not living in their household, especially when social distancing is difficult to maintain. °? Masks should not be placed on young children under age 2, anyone who has trouble breathing, or is unconscious, incapacitated or otherwise  unable to remove the mask without assistance. °· The mask is meant to protect other people in case you are infected. °· Do NOT use a facemask meant for a healthcare worker. °· Continue to keep about 6 feet between yourself and others. The mask is not a substitute for social distancing. °Cover coughs and sneezes °· Always cover your mouth and nose with a tissue when you cough or sneeze or use the inside of your elbow. °· Throw used tissues in the trash. °· Immediately wash your hands with soap and water for at least 20 seconds. If soap and water are not readily available, clean your hands with a hand sanitizer that contains at least 60% alcohol. °Clean and disinfect °· Clean AND disinfect frequently touched surfaces daily. This includes tables, doorknobs, light switches, countertops, handles, desks, phones, keyboards, toilets, faucets, and sinks. www.cdc.gov/coronavirus/2019-ncov/prevent-getting-sick/disinfecting-your-home.html °· If surfaces are dirty, clean them: Use detergent or soap and water prior to disinfection. °· Then, use a household disinfectant. You can see a list of EPA-registered household disinfectants here. °cdc.gov/coronavirus °05/25/2019 °This information is not intended to replace advice given to you by your health care provider. Make sure you discuss any questions you have with your health care provider. °Document Revised: 06/02/2019 Document Reviewed: 03/31/2019 °Elsevier Patient Education © 2020 Elsevier Inc. ° ° ° °10 Things You Can Do to Manage Your COVID-19 Symptoms at Home °If you have possible or confirmed COVID-19: °1. Stay home from work and school. And stay away from other public places. If you must go out, avoid using any kind of public transportation, ridesharing, or taxis. °2. Monitor   your symptoms carefully. If your symptoms get worse, call your healthcare provider immediately. °3. Get rest and stay hydrated. °4. If you have a medical appointment, call the healthcare provider ahead  of time and tell them that you have or may have COVID-19. °5. For medical emergencies, call 911 and notify the dispatch personnel that you have or may have COVID-19. °6. Cover your cough and sneezes with a tissue or use the inside of your elbow. °7. Wash your hands often with soap and water for at least 20 seconds or clean your hands with an alcohol-based hand sanitizer that contains at least 60% alcohol. °8. As much as possible, stay in a specific room and away from other people in your home. Also, you should use a separate bathroom, if available. If you need to be around other people in or outside of the home, wear a mask. °9. Avoid sharing personal items with other people in your household, like dishes, towels, and bedding. °10. Clean all surfaces that are touched often, like counters, tabletops, and doorknobs. Use household cleaning sprays or wipes according to the label instructions. °cdc.gov/coronavirus °03/23/2019 °This information is not intended to replace advice given to you by your health care provider. Make sure you discuss any questions you have with your health care provider. °Document Revised: 08/25/2019 Document Reviewed: 08/25/2019 °Elsevier Patient Education © 2020 Elsevier Inc. ° ° ° °COVID-19: Quarantine vs. Isolation °QUARANTINE keeps someone who was in close contact with someone who has COVID-19 away from others. °If you had close contact with a person who has COVID-19 °· Stay home until 14 days after your last contact. °· Check your temperature twice a day and watch for symptoms of COVID-19. °· If possible, stay away from people who are at higher-risk for getting very sick from COVID-19. °ISOLATION keeps someone who is sick or tested positive for COVID-19 without symptoms away from others, even in their own home. °If you are sick and think or know you have COVID-19 °· Stay home until after °? At least 10 days since symptoms first appeared and °? At least 24 hours with no fever without  fever-reducing medication and °? Symptoms have improved °If you tested positive for COVID-19 but do not have symptoms °· Stay home until after °? 10 days have passed since your positive test °If you live with others, stay in a specific "sick room" or area and away from other people or animals, including pets. Use a separate bathroom, if available. °cdc.gov/coronavirus °04/11/2019 °This information is not intended to replace advice given to you by your health care provider. Make sure you discuss any questions you have with your health care provider. °Document Revised: 08/25/2019 Document Reviewed: 08/25/2019 °Elsevier Patient Education © 2020 Elsevier Inc. ° °

## 2019-10-14 ENCOUNTER — Other Ambulatory Visit: Payer: Self-pay

## 2019-10-14 ENCOUNTER — Ambulatory Visit: Payer: Medicaid Other

## 2019-10-14 DIAGNOSIS — Z79899 Other long term (current) drug therapy: Secondary | ICD-10-CM

## 2019-10-14 LAB — CULTURE, BLOOD (ROUTINE X 2)
Culture: NO GROWTH
Culture: NO GROWTH
Special Requests: ADEQUATE

## 2019-10-14 NOTE — Progress Notes (Signed)
Medication Management Clinic Visit Note  Patient: Rebekah Salinas MRN: [id number] Date of Birth: [birth year] PCP: Rolm Gala, NP   Sheela Stack 64 y.o. female presents for a telephone medication therapy management visit with the pharmacist today. Patient was identified using two patient identifiers. Outreach call was kept brief as patient was noticeably short of breath.   There were no vitals taken for this visit.  Patient Information   Past Medical History:  Diagnosis Date  . Diabetes mellitus without complication (HCC)   . Hypertension   . Thyroid disease       Past Surgical History:  Procedure Laterality Date  . ABDOMINAL HYSTERECTOMY    . BREAST BIOPSY Left    neg  . BREAST BIOPSY Left    core bx- neg  . BREAST BIOPSY Left 06/13/2019   [place] bx 2:00 coil   . TUBAL LIGATION       Family History  Problem Relation Age of Onset  . CAD Sister   . Breast cancer Sister 74       twin sister  . Cancer Sister   . Cancer Maternal Grandmother     New Diagnoses (since last visit): Acute respiratory disease due to COVID-19 virus  Family Support: Good  Lifestyle Diet: Did not ask at this visit  Exercise: Did not ask at this visit   Social History   Substance and Sexual Activity  Alcohol Use Never   Patient denies alcohol consumption   Social History   Tobacco Use  Smoking Status Never Smoker  Smokeless Tobacco Never Used   Patient denies tobacco or illicit substance use   Health Maintenance  Topic Date Due  . Hepatitis C Screening  1956-08-24  . TETANUS/TDAP  03/05/1976  . PAP SMEAR-Modifier  03/05/1978  . COLONOSCOPY  03/06/2007  . MAMMOGRAM  01/13/2020  . INFLUENZA VACCINE  Completed  . HIV Screening  Completed   Outpatient Encounter Medications as of 10/14/2019  Medication Sig  . acetaminophen (TYLENOL) 325 MG tablet Take 650 mg by mouth every 6 (six) hours as needed.  Marland Kitchen ascorbic acid (VITAMIN C) 500 MG tablet Take 1 tablet (500 mg total)  by mouth daily.  Marland Kitchen atorvastatin (LIPITOR) 40 MG tablet Take 1 tablet (40 mg total) by mouth daily.  . cyclobenzaprine (FLEXERIL) 10 MG tablet Take 1 tablet (10 mg total) by mouth 2 (two) times daily.  Marland Kitchen dextromethorphan-guaiFENesin (MUCINEX DM) 30-600 MG 12hr tablet Take 1 tablet by mouth 2 (two) times daily.  Marland Kitchen guaiFENesin-codeine 100-10 MG/5ML syrup Take 10 mLs by mouth every 4 (four) hours as needed for cough.  . levothyroxine (SYNTHROID) 88 MCG tablet TAKE ONE TABLET BY MOUTH EVERY DAY BEFORE BREAKFAST. (Patient taking differently: Take 88 mcg by mouth daily before breakfast. TAKE ONE TABLET BY MOUTH EVERY DAY BEFORE BREAKFAST.)  . lisinopril (ZESTRIL) 5 MG tablet Take 1 tablet (5 mg total) by mouth daily.  . metFORMIN (GLUCOPHAGE) 500 MG tablet TAKE TWO TABLETS (1000MG ) BY MOUTH EVERY DAY WITH BREAKFAST.  omeprazole (PRILOSEC) 20 MG capsule Take 1 capsule (20 mg total) by mouth daily.  . predniSONE (DELTASONE) 20 MG tablet Take 2 tablets (40 mg total) by mouth daily for 5 days.  . primidone (MYSOLINE) 50 MG tablet TAKE ONE TABLET BY MOUTH 2 TIMES A DAY (Patient taking differently: Take 50 mg by mouth 2 (two) times daily. TAKE ONE TABLET BY MOUTH 2 TIMES A DAY)  . zinc sulfate 220 (50 Zn) MG capsule Take 1 capsule (220  mg total) by mouth daily.   No facility-administered encounter medications on file as of 10/14/2019.    Health Maintenance/Date Completed  Last ED visit: Admitted to [facility] 10/09/2019-10/13/2019 for acute respiratory disease due to COVID-19 virus Last Visit to PCP: 09/29/2019 [facility]) Next Visit to PCP: 10/25/2019 [facility] Specialist Visit: Surgery follow-up 06/24/2019 s/p left breast lumpectomy Dental Exam: Unk. Eye Exam: Due Pelvic/PAP Exam: Unk. Appears she was to have follow-up pap smear at health department per 03/15/2019 PCP note Mammogram: 01/10/2019 DEXA: <65 y/o Colonoscopy: 07/21/2013 Flu Vaccine: Endorses receipt Pneumonia Vaccine: Due COVID-19 Vaccine: Not currently a  candidate due to recent infection Shingrix Vaccine: Due  Assessment and Plan:  1. Acute respiratory disease due to COVID-19 virus -Hospitalized at [facility] 10/09/2019-10/13/2019 -S/p 5-day course of remdesivir while in-patient -Discharged on prednisone to complete 10-day steroid course -Also discharged on vitamin C, zinc, guaifenesin with dextromethorphan PRN (cough), guaifenesin with codeine PRN (cough) -Per chart, she was contacted regarding bamlanivimab on 10/06/2019 but appears she never received due to lack of available appointments -Endorsing shortness of breath, loss of smell, loss of taste -Home health following per discharge note  2. Pre-diabetes -Last Hgb A1C was 6.3% on 10/10/2019 which is relatively stable compared to readings over the last year -Antihyperglycemics include metformin 1000 mg QAM with breakfast -Vitamin B12 575 on 03/09/2019, renal function stable  3. Dyslipidemia -Last lipid panel 10/10/2019 with HDL 24, LDL 48, TG 53 which reflected markedly different values from lipid panel on 03/09/2019 which may be explained by acute illness -She is on a high intensity statin, atorvastatin 40 mg daily  4. Hypertension -Lisinopril 5 mg daily  5. Hypothyroidism -Last TSH 0.586 (WNL) on 07/20/2019 -Levothyroxine 88 mcg QAM  6. Benign head tremor -Primidone 50 mg two times daily  7. Head/neck/muscle pain -Cyclobenzaprine 10 mg two times daily and APAP 650 mg q6h PRN  8. GERD -Omeprazole 20 mg daily  RTC 6 months  [place] Resident 14 October 2019

## 2019-10-17 ENCOUNTER — Other Ambulatory Visit: Payer: Self-pay

## 2019-10-25 ENCOUNTER — Ambulatory Visit: Payer: Medicaid Other | Admitting: Gerontology

## 2019-10-25 DIAGNOSIS — E039 Hypothyroidism, unspecified: Secondary | ICD-10-CM

## 2019-10-25 DIAGNOSIS — Z09 Encounter for follow-up examination after completed treatment for conditions other than malignant neoplasm: Secondary | ICD-10-CM | POA: Insufficient documentation

## 2019-10-25 HISTORY — DX: Encounter for follow-up examination after completed treatment for conditions other than malignant neoplasm: Z09

## 2019-10-25 MED ORDER — LEVOTHYROXINE SODIUM 88 MCG PO TABS: ORAL_TABLET | ORAL | 0 refills | Status: DC

## 2019-10-25 NOTE — Patient Instructions (Signed)
DASH Eating Plan DASH stands for "Dietary Approaches to Stop Hypertension." The DASH eating plan is a healthy eating plan that has been shown to reduce high blood pressure (hypertension). It may also reduce your risk for type 2 diabetes, heart disease, and stroke. The DASH eating plan may also help with weight loss. What are tips for following this plan?  General guidelines  Avoid eating more than 2,300 mg (milligrams) of salt (sodium) a day. If you have hypertension, you may need to reduce your sodium intake to 1,500 mg a day.  Limit alcohol intake to no more than 1 drink a day for nonpregnant women and 2 drinks a day for men. One drink equals 12 oz of beer, 5 oz of wine, or 1 oz of hard liquor.  Work with your health care provider to maintain a healthy body weight or to lose weight. Ask what an ideal weight is for you.  Get at least 30 minutes of exercise that causes your heart to beat faster (aerobic exercise) most days of the week. Activities may include walking, swimming, or biking.  Work with your health care provider or diet and nutrition specialist (dietitian) to adjust your eating plan to your individual calorie needs. Reading food labels   Check food labels for the amount of sodium per serving. Choose foods with less than 5 percent of the Daily Value of sodium. Generally, foods with less than 300 mg of sodium per serving fit into this eating plan.  To find whole grains, look for the word "whole" as the first word in the ingredient list. Shopping  Buy products labeled as "low-sodium" or "no salt added."  Buy fresh foods. Avoid canned foods and premade or frozen meals. Cooking  Avoid adding salt when cooking. Use salt-free seasonings or herbs instead of table salt or sea salt. Check with your health care provider or pharmacist before using salt substitutes.  Do not fry foods. Cook foods using healthy methods such as baking, boiling, grilling, and broiling instead.  Cook with  heart-healthy oils, such as olive, canola, soybean, or sunflower oil. Meal planning  Eat a balanced diet that includes: ? 5 or more servings of fruits and vegetables each day. At each meal, try to fill half of your plate with fruits and vegetables. ? Up to 6-8 servings of whole grains each day. ? Less than 6 oz of lean meat, poultry, or fish each day. A 3-oz serving of meat is about the same size as a deck of cards. One egg equals 1 oz. ? 2 servings of low-fat dairy each day. ? A serving of nuts, seeds, or beans 5 times each week. ? Heart-healthy fats. Healthy fats called Omega-3 fatty acids are found in foods such as flaxseeds and coldwater fish, like sardines, salmon, and mackerel.  Limit how much you eat of the following: ? Canned or prepackaged foods. ? Food that is high in trans fat, such as fried foods. ? Food that is high in saturated fat, such as fatty meat. ? Sweets, desserts, sugary drinks, and other foods with added sugar. ? Full-fat dairy products.  Do not salt foods before eating.  Try to eat at least 2 vegetarian meals each week.  Eat more home-cooked food and less restaurant, buffet, and fast food.  When eating at a restaurant, ask that your food be prepared with less salt or no salt, if possible. What foods are recommended? The items listed may not be a complete list. Talk with your dietitian about   what dietary choices are best for you. Grains Whole-grain or whole-wheat bread. Whole-grain or whole-wheat pasta. Brown rice. Oatmeal. Quinoa. Bulgur. Whole-grain and low-sodium cereals. Pita bread. Low-fat, low-sodium crackers. Whole-wheat flour tortillas. Vegetables Fresh or frozen vegetables (raw, steamed, roasted, or grilled). Low-sodium or reduced-sodium tomato and vegetable juice. Low-sodium or reduced-sodium tomato sauce and tomato paste. Low-sodium or reduced-sodium canned vegetables. Fruits All fresh, dried, or frozen fruit. Canned fruit in natural juice (without  added sugar). Meat and other protein foods Skinless chicken or turkey. Ground chicken or turkey. Pork with fat trimmed off. Fish and seafood. Egg whites. Dried beans, peas, or lentils. Unsalted nuts, nut butters, and seeds. Unsalted canned beans. Lean cuts of beef with fat trimmed off. Low-sodium, lean deli meat. Dairy Low-fat (1%) or fat-free (skim) milk. Fat-free, low-fat, or reduced-fat cheeses. Nonfat, low-sodium ricotta or cottage cheese. Low-fat or nonfat yogurt. Low-fat, low-sodium cheese. Fats and oils Soft margarine without trans fats. Vegetable oil. Low-fat, reduced-fat, or light mayonnaise and salad dressings (reduced-sodium). Canola, safflower, olive, soybean, and sunflower oils. Avocado. Seasoning and other foods Herbs. Spices. Seasoning mixes without salt. Unsalted popcorn and pretzels. Fat-free sweets. What foods are not recommended? The items listed may not be a complete list. Talk with your dietitian about what dietary choices are best for you. Grains Baked goods made with fat, such as croissants, muffins, or some breads. Dry pasta or rice meal packs. Vegetables Creamed or fried vegetables. Vegetables in a cheese sauce. Regular canned vegetables (not low-sodium or reduced-sodium). Regular canned tomato sauce and paste (not low-sodium or reduced-sodium). Regular tomato and vegetable juice (not low-sodium or reduced-sodium). Pickles. Olives. Fruits Canned fruit in a light or heavy syrup. Fried fruit. Fruit in cream or butter sauce. Meat and other protein foods Fatty cuts of meat. Ribs. Fried meat. Bacon. Sausage. Bologna and other processed lunch meats. Salami. Fatback. Hotdogs. Bratwurst. Salted nuts and seeds. Canned beans with added salt. Canned or smoked fish. Whole eggs or egg yolks. Chicken or turkey with skin. Dairy Whole or 2% milk, cream, and half-and-half. Whole or full-fat cream cheese. Whole-fat or sweetened yogurt. Full-fat cheese. Nondairy creamers. Whipped toppings.  Processed cheese and cheese spreads. Fats and oils Butter. Stick margarine. Lard. Shortening. Ghee. Bacon fat. Tropical oils, such as coconut, palm kernel, or palm oil. Seasoning and other foods Salted popcorn and pretzels. Onion salt, garlic salt, seasoned salt, table salt, and sea salt. Worcestershire sauce. Tartar sauce. Barbecue sauce. Teriyaki sauce. Soy sauce, including reduced-sodium. Steak sauce. Canned and packaged gravies. Fish sauce. Oyster sauce. Cocktail sauce. Horseradish that you find on the shelf. Ketchup. Mustard. Meat flavorings and tenderizers. Bouillon cubes. Hot sauce and Tabasco sauce. Premade or packaged marinades. Premade or packaged taco seasonings. Relishes. Regular salad dressings. Where to find more information:  National Heart, Lung, and Blood Institute: www.nhlbi.nih.gov  American Heart Association: www.heart.org Summary  The DASH eating plan is a healthy eating plan that has been shown to reduce high blood pressure (hypertension). It may also reduce your risk for type 2 diabetes, heart disease, and stroke.  With the DASH eating plan, you should limit salt (sodium) intake to 2,300 mg a day. If you have hypertension, you may need to reduce your sodium intake to 1,500 mg a day.  When on the DASH eating plan, aim to eat more fresh fruits and vegetables, whole grains, lean proteins, low-fat dairy, and heart-healthy fats.  Work with your health care provider or diet and nutrition specialist (dietitian) to adjust your eating plan to your   individual calorie needs. This information is not intended to replace advice given to you by your health care provider. Make sure you discuss any questions you have with your health care provider. Document Revised: 08/21/2017 Document Reviewed: 09/01/2016 Elsevier Patient Education  2020 Else[employer] Inc.  

## 2019-10-25 NOTE — Progress Notes (Signed)
Established Patient Office Visit  Subjective:  Patient ID: Rebekah Salinas, female    DOB: 1956-06-25  Age: 64 y.o. MRN: [id number]  CC:  Chief Complaint  Patient presents with  . Hypertension    Covid + 10/06/2019  . Cough    improving    HPI Rebekah Salinas presents for follow up post hospital discharge and medication refill.  During her hospital course, she was treated for Covid 19 symptoms, and was discharged on 10/13/2019.  Currently she states that her appetite is good, and her loss of sense of taste is improving.  She also reports experiencing minimal sore throat and dry cough.  She denies chest pain, palpitation, lightheadedness, shortness of breath, fever and chills.  Overall she states that her Covid symptoms are improving daily and she offers no further complaint.  Past Medical History:  Diagnosis Date  . Diabetes mellitus without complication ([place])   . Hypertension   . Thyroid disease     Past Surgical History:  Procedure Laterality Date  . ABDOMINAL HYSTERECTOMY    . BREAST BIOPSY Left    neg  . BREAST BIOPSY Left    core bx- neg  . BREAST BIOPSY Left 06/13/2019   [place] bx 2:00 coil   . TUBAL LIGATION      Family History  Problem Relation Age of Onset  . CAD Sister   . Breast cancer Sister 27       twin sister  . Cancer Sister   . Cancer Maternal Grandmother     Social History   Socioeconomic History  . Marital status: Married    Spouse name: Not on file  . Number of children: Not on file  . Years of education: Not on file  . Highest education level: Not on file  Occupational History  . Not on file  Tobacco Use  . Smoking status: Never Smoker  . Smokeless tobacco: Never Used  Substance and Sexual Activity  . Alcohol use: Never  . Drug use: Never  . Sexual activity: Yes    Birth control/protection: None  Other Topics Concern  . Not on file  Social History Narrative  . Not on file   Social Determinants of Health   Financial Resource Strain:    . Difficulty of Paying Living Expenses: Not on file  Food Insecurity:   . Worried About Charity fundraiser in the Last Year: Not on file  . Ran Out of Food in the Last Year: Not on file  Transportation Needs:   . Lack of Transportation (Medical): Not on file  . Lack of Transportation (Non-Medical): Not on file  Physical Activity:   . Days of Exercise per Week: Not on file  . Minutes of Exercise per Session: Not on file  Stress:   . Feeling of Stress : Not on file  Social Connections:   . Frequency of Communication with Friends and Family: Not on file  . Frequency of Social Gatherings with Friends and Family: Not on file  . Attends Religious Services: Not on file  . Active Member of Clubs or Organizations: Not on file  . Attends Archivist Meetings: Not on file  . Marital Status: Not on file  Intimate Partner Violence:   . Fear of Current or Ex-Partner: Not on file  . Emotionally Abused: Not on file  . Physically Abused: Not on file  . Sexually Abused: Not on file    Outpatient Medications Prior to Visit  Medication Sig Dispense  Refill  . acetaminophen (TYLENOL) 325 MG tablet Take 650 mg by mouth every 6 (six) hours as needed.    Marland Kitchen ascorbic acid (VITAMIN C) 500 MG tablet Take 1 tablet (500 mg total) by mouth daily. 30 tablet 0  . atorvastatin (LIPITOR) 40 MG tablet Take 1 tablet (40 mg total) by mouth daily. 90 tablet 1  . cyclobenzaprine (FLEXERIL) 10 MG tablet Take 1 tablet (10 mg total) by mouth 2 (two) times daily. 60 tablet 3  . lisinopril (ZESTRIL) 5 MG tablet Take 1 tablet (5 mg total) by mouth daily. 90 tablet 1  . metFORMIN (GLUCOPHAGE) 500 MG tablet TAKE TWO TABLETS (1000MG) BY MOUTH EVERY DAY WITH BREAKFAST. 120 tablet 0  . omeprazole (PRILOSEC) 20 MG capsule Take 1 capsule (20 mg total) by mouth daily. 90 capsule 1  . primidone (MYSOLINE) 50 MG tablet TAKE ONE TABLET BY MOUTH 2 TIMES A DAY (Patient taking differently: Take 50 mg by mouth 2 (two) times daily.  TAKE ONE TABLET BY MOUTH 2 TIMES A DAY) 120 tablet 1  . zinc sulfate 220 (50 Zn) MG capsule Take 1 capsule (220 mg total) by mouth daily. 30 capsule 0  . levothyroxine (SYNTHROID) 88 MCG tablet TAKE ONE TABLET BY MOUTH EVERY DAY BEFORE BREAKFAST. (Patient taking differently: Take 88 mcg by mouth daily before breakfast. TAKE ONE TABLET BY MOUTH EVERY DAY BEFORE BREAKFAST.) 30 tablet 0  . dextromethorphan-guaiFENesin (MUCINEX DM) 30-600 MG 12hr tablet Take 1 tablet by mouth 2 (two) times daily. 10 tablet 0  . guaiFENesin-codeine 100-10 MG/5ML syrup Take 10 mLs by mouth every 4 (four) hours as needed for cough. 120 mL 0   No facility-administered medications prior to visit.    Allergies  Allergen Reactions  . Aspirin Nausea And Vomiting    ROS Review of Systems  Constitutional: Negative.  Negative for chills and fever.  HENT: Positive for sore throat.   Respiratory: Positive for cough. Negative for chest tightness and shortness of breath.   Cardiovascular: Negative.   Neurological: Negative.   Psychiatric/Behavioral: Negative.       Objective:    Physical Exam No physical exam was done There were no vitals taken for this visit. Wt Readings from Last 3 Encounters:  10/09/19 207 lb (93.9 kg)  10/03/19 207 lb (93.9 kg)  09/29/19 207 lb (93.9 kg)     Health Maintenance Due  Topic Date Due  . Hepatitis C Screening  02/15/57  . TETANUS/TDAP  03/05/1976  . PAP SMEAR-Modifier  03/05/1978  . COLONOSCOPY  03/06/2007    There are no preventive care reminders to display for this patient.  Lab Results  Component Value Date   TSH 0.586 07/20/2019   Lab Results  Component Value Date   WBC 9.2 10/13/2019   HGB 12.0 10/13/2019   HCT 37.4 10/13/2019   MCV 86.0 10/13/2019   PLT 507 (H) 10/13/2019   Lab Results  Component Value Date   NA 141 10/13/2019   K 4.3 10/13/2019   CO2 28 10/13/2019   GLUCOSE 179 (H) 10/13/2019   BUN 28 (H) 10/13/2019   CREATININE 1.04 (H)  10/13/2019   BILITOT 0.8 10/13/2019   ALKPHOS 53 10/13/2019   AST 22 10/13/2019   ALT 27 10/13/2019   PROT 6.6 10/13/2019   ALBUMIN 3.1 (L) 10/13/2019   CALCIUM 8.6 (L) 10/13/2019   ANIONGAP 9 10/13/2019   Lab Results  Component Value Date   CHOL 83 10/10/2019   Lab Results  Component  Value Date   HDL 24 (L) 10/10/2019   Lab Results  Component Value Date   LDLCALC 48 10/10/2019   Lab Results  Component Value Date   TRIG 53 10/10/2019   Lab Results  Component Value Date   CHOLHDL 3.5 10/10/2019   Lab Results  Component Value Date   HGBA1C 6.3 (H) 10/10/2019      Assessment & Plan:   1. Hypothyroidism, unspecified type -Her TSH done on 07/20/2019 was 0.586, she was euthyroid and will continue on 88 mcg of levothyroxine. - levothyroxine (SYNTHROID) 88 MCG tablet; TAKE ONE TABLET BY MOUTH EVERY DAY BEFORE BREAKFAST.  Dispense: 30 tablet; Refill: 0  2. Hospital discharge follow-up -She reports that she is improving from Covid 19 symptoms, and we will recheck the following labs. - Comp Met (CMET); Future - CBC with Differential/Platelet; Future     Follow-up: Return in about 7 weeks (around 12/14/2019), or if symptoms worsen or fail to improve.    Kristalyn Bergstresser Jerold Coombe, NP

## 2019-11-16 ENCOUNTER — Other Ambulatory Visit: Payer: Self-pay | Admitting: Gerontology

## 2019-11-16 DIAGNOSIS — M25512 Pain in left shoulder: Secondary | ICD-10-CM

## 2019-11-16 DIAGNOSIS — M545 Low back pain, unspecified: Secondary | ICD-10-CM

## 2019-11-16 DIAGNOSIS — M542 Cervicalgia: Secondary | ICD-10-CM

## 2019-11-16 DIAGNOSIS — G8929 Other chronic pain: Secondary | ICD-10-CM

## 2019-12-01 ENCOUNTER — Telehealth: Payer: Self-pay | Admitting: Pharmacy Technician

## 2019-12-01 NOTE — Telephone Encounter (Signed)
Received updated proof of income.  Patient eligible to receive medication assistance at [facility] until time for re-certification in 9359, and as long as eligibility requirements continue to be met.  [facility]

## 2019-12-07 ENCOUNTER — Other Ambulatory Visit: Payer: Self-pay

## 2019-12-07 ENCOUNTER — Other Ambulatory Visit: Payer: Medicaid Other

## 2019-12-07 DIAGNOSIS — E039 Hypothyroidism, unspecified: Secondary | ICD-10-CM

## 2019-12-07 DIAGNOSIS — R7303 Prediabetes: Secondary | ICD-10-CM

## 2019-12-07 DIAGNOSIS — Z09 Encounter for follow-up examination after completed treatment for conditions other than malignant neoplasm: Secondary | ICD-10-CM

## 2019-12-08 LAB — CBC WITH DIFFERENTIAL/PLATELET
Basophils Absolute: 0.1 10*3/uL (ref 0.0–0.2)
Basos: 1 %
EOS (ABSOLUTE): 0.2 10*3/uL (ref 0.0–0.4)
Eos: 5 %
Hematocrit: 38.3 % (ref 34.0–46.6)
Hemoglobin: 12.5 g/dL (ref 11.1–15.9)
Immature Grans (Abs): 0 10*3/uL (ref 0.0–0.1)
Immature Granulocytes: 0 %
Lymphocytes Absolute: 1.6 10*3/uL (ref 0.7–3.1)
Lymphs: 32 %
MCH: 28 pg (ref 26.6–33.0)
MCHC: 32.6 g/dL (ref 31.5–35.7)
MCV: 86 fL (ref 79–97)
Monocytes Absolute: 0.5 10*3/uL (ref 0.1–0.9)
Monocytes: 10 %
Neutrophils Absolute: 2.7 10*3/uL (ref 1.4–7.0)
Neutrophils: 52 %
Platelets: 418 10*3/uL (ref 150–450)
RBC: 4.46 x10E6/uL (ref 3.77–5.28)
RDW: 14 % (ref 11.7–15.4)
WBC: 5.1 10*3/uL (ref 3.4–10.8)

## 2019-12-08 LAB — HEMOGLOBIN A1C
Est. average glucose Bld gHb Est-mCnc: 126 mg/dL
Hgb A1c MFr Bld: 6 % — ABNORMAL HIGH (ref 4.8–5.6)

## 2019-12-08 LAB — COMPREHENSIVE METABOLIC PANEL
ALT: 12 IU/L (ref 0–32)
AST: 14 IU/L (ref 0–40)
Albumin/Globulin Ratio: 1.6 (ref 1.2–2.2)
Albumin: 4.3 g/dL (ref 3.8–4.8)
Alkaline Phosphatase: 88 IU/L (ref 39–117)
BUN/Creatinine Ratio: 8 — ABNORMAL LOW (ref 12–28)
BUN: 8 mg/dL (ref 8–27)
Bilirubin Total: 0.4 mg/dL (ref 0.0–1.2)
CO2: 27 mmol/L (ref 20–29)
Calcium: 9.7 mg/dL (ref 8.7–10.3)
Chloride: 103 mmol/L (ref 96–106)
Creatinine, Ser: 0.97 mg/dL (ref 0.57–1.00)
GFR calc Af Amer: 72 mL/min/{1.73_m2} (ref >59)
GFR calc non Af Amer: 63 mL/min/{1.73_m2} (ref >59)
Globulin, Total: 2.7 g/dL (ref 1.5–4.5)
Glucose: 103 mg/dL — ABNORMAL HIGH (ref 65–99)
Potassium: 4.4 mmol/L (ref 3.5–5.2)
Sodium: 142 mmol/L (ref 134–144)
Total Protein: 7 g/dL (ref 6.0–8.5)

## 2019-12-08 LAB — TSH: TSH: 0.048 u[IU]/mL — ABNORMAL LOW (ref 0.450–4.500)

## 2019-12-09 ENCOUNTER — Ambulatory Visit: Payer: Medicaid Other | Attending: Internal Medicine

## 2019-12-09 DIAGNOSIS — Z23 Encounter for immunization: Secondary | ICD-10-CM

## 2019-12-09 NOTE — Progress Notes (Signed)
   Covid-19 Vaccination Clinic  Name:  Ahnesti Townsend    MRN: [id number] DOB: 14-Aug-1956  12/09/2019  Ms. Gintz was observed post Covid-19 immunization for 15 minutes without incident. She was provided with Vaccine Information Sheet and instruction to access the V-Safe system.   Ms. Maloney was instructed to call 911 with any severe reactions post vaccine: Marland Kitchen Difficulty breathing  . Swelling of face and throat  . A fast heartbeat  . A bad rash all over body  . Dizziness and weakness   Immunizations Administered    Name Date Dose VIS Date Route   Pfizer COVID-19 Vaccine 12/09/2019 12:14 PM 0.3 mL 09/02/2019 Intramuscular   Manufacturer: [employer], [employer]   Lot: [id number]   NDC: [address]-2

## 2019-12-14 ENCOUNTER — Ambulatory Visit: Payer: Medicaid Other | Admitting: Gerontology

## 2019-12-14 ENCOUNTER — Encounter: Payer: Self-pay | Admitting: Gerontology

## 2019-12-14 ENCOUNTER — Other Ambulatory Visit: Payer: Self-pay

## 2019-12-14 VITALS — BP 112/76 | HR 88 | Temp 98.2°F | Ht 64.0 in | Wt 201.6 lb

## 2019-12-14 DIAGNOSIS — I10 Essential (primary) hypertension: Secondary | ICD-10-CM

## 2019-12-14 DIAGNOSIS — R252 Cramp and spasm: Secondary | ICD-10-CM

## 2019-12-14 DIAGNOSIS — R7303 Prediabetes: Secondary | ICD-10-CM

## 2019-12-14 DIAGNOSIS — E039 Hypothyroidism, unspecified: Secondary | ICD-10-CM

## 2019-12-14 HISTORY — DX: Cramp and spasm: R25.2

## 2019-12-14 MED ORDER — LEVOTHYROXINE SODIUM 75 MCG PO TABS: ORAL_TABLET | ORAL | 0 refills | Status: DC

## 2019-12-14 NOTE — Progress Notes (Signed)
Established Patient Office Visit  Subjective:  Patient ID: Rebekah Salinas, female    DOB: 1956/04/01  Age: 64 y.o. MRN: [id number]  CC:  Chief Complaint  Patient presents with  . Follow-up    also notes cramping in hands and feet    HPI [name] presents for follow up of hypothyroidism,hypertension, prediabetes, and lab review. Her HgbA1c was done on 12/07/2019 decreased from 6.3% to 6.0%. She states that she is compliant with her medications and continues to make healthy lifestyle changes. Her TSH was 0.048 and she states that she takes 88 mcg of Levothyroxine as prescribed. She denies heart palpitation, heat/cold intolerance and low energy. Currently, she reports that she's experiencing tingling and cramps to her hands and legs after hospital discharge with Covid infection. She states that the cramps occur only with activities such as when she's cleaning and it subsides within 1-2 minutes. She states that it's not bothersome to her. She denies chest pain, palpitation, shortness of breath, fever and chills. Overall, she states that she's doing well and offers no further complaint.  Past Medical History:  Diagnosis Date  . Diabetes mellitus without complication ([facility])   . Hypertension   . Thyroid disease     Past Surgical History:  Procedure Laterality Date  . ABDOMINAL HYSTERECTOMY    . BREAST BIOPSY Left    neg  . BREAST BIOPSY Left    core bx- neg  . BREAST BIOPSY Left 06/13/2019   [place] bx 2:00 coil   . TUBAL LIGATION      Family History  Problem Relation Age of Onset  . CAD Sister   . Breast cancer Sister 6       twin sister  . Cancer Sister   . Cancer Maternal Grandmother     Social History   Socioeconomic History  . Marital status: Married    Spouse name: Not on file  . Number of children: Not on file  . Years of education: Not on file  . Highest education level: Not on file  Occupational History  . Not on file  Tobacco Use  . Smoking status: Never  Smoker  . Smokeless tobacco: Never Used  Substance and Sexual Activity  . Alcohol use: Never  . Drug use: Never  . Sexual activity: Yes    Birth control/protection: None  Other Topics Concern  . Not on file  Social History Narrative  . Not on file   Social Determinants of Health   Financial Resource Strain:   . Difficulty of Paying Living Expenses:   Food Insecurity:   . Worried About Programme researcher, broadcasting/film/video in the Last Year:   . Barista in the Last Year:   Transportation Needs:   . Freight forwarder (Medical):   Marland Kitchen Lack of Transportation (Non-Medical):   Physical Activity:   . Days of Exercise per Week:   . Minutes of Exercise per Session:   Stress:   . Feeling of Stress :   Social Connections:   . Frequency of Communication with Friends and Family:   . Frequency of Social Gatherings with Friends and Family:   . Attends Religious Services:   . Active Member of Clubs or Organizations:   . Attends Banker Meetings:   Marland Kitchen Marital Status:   Intimate Partner Violence:   . Fear of Current or Ex-Partner:   . Emotionally Abused:   Marland Kitchen Physically Abused:   . Sexually Abused:     Outpatient  Medications Prior to Visit  Medication Sig Dispense Refill  . acetaminophen (TYLENOL) 325 MG tablet Take 650 mg by mouth every 6 (six) hours as needed.    Marland Kitchen ascorbic acid (VITAMIN C) 500 MG tablet Take 1 tablet (500 mg total) by mouth daily. 30 tablet 0  . atorvastatin (LIPITOR) 40 MG tablet Take 1 tablet (40 mg total) by mouth daily. 90 tablet 1  . cyclobenzaprine (FLEXERIL) 10 MG tablet TAKE ONE TABLET BY MOUTH 2 TIMES A DAY 60 tablet 0  . lisinopril (ZESTRIL) 5 MG tablet Take 1 tablet (5 mg total) by mouth daily. 90 tablet 1  . metFORMIN (GLUCOPHAGE) 500 MG tablet TAKE TWO TABLETS (1000MG ) BY MOUTH EVERY DAY WITH BREAKFAST. 120 tablet 0  . omeprazole (PRILOSEC) 20 MG capsule Take 1 capsule (20 mg total) by mouth daily. 90 capsule 1  . primidone (MYSOLINE) 50 MG tablet  TAKE ONE TABLET BY MOUTH 2 TIMES A DAY (Patient taking differently: Take 50 mg by mouth 2 (two) times daily. TAKE ONE TABLET BY MOUTH 2 TIMES A DAY) 120 tablet 1  . zinc sulfate 220 (50 Zn) MG capsule Take 1 capsule (220 mg total) by mouth daily. 30 capsule 0  . levothyroxine (SYNTHROID) 88 MCG tablet TAKE ONE TABLET BY MOUTH EVERY DAY BEFORE BREAKFAST. 30 tablet 0   No facility-administered medications prior to visit.    Allergies  Allergen Reactions  . Aspirin Nausea And Vomiting    ROS Review of Systems  Eyes: Negative.   Respiratory: Negative.   Cardiovascular: Negative.   Endocrine: Negative.   Genitourinary: Negative.   Neurological: Positive for tremors (Chronic head tremor).  Psychiatric/Behavioral: Negative.       Objective:    Physical Exam  Constitutional: She is oriented to person, place, and time. She appears well-developed.  HENT:  Head: Normocephalic and atraumatic.  Eyes: Pupils are equal, round, and reactive to light. EOM are normal.  Cardiovascular: Normal rate and regular rhythm.  Pulmonary/Chest: Effort normal and breath sounds normal.  Neurological: She is alert and oriented to person, place, and time.  Psychiatric: She has a normal mood and affect. Her behavior is normal. Judgment and thought content normal.    BP 112/76 (BP Location: Left Arm, Patient Position: Sitting, Cuff Size: Large)   Pulse 88   Temp 98.2 F (36.8 C)   Ht 5\' 4"  (1.626 m)   Wt 201 lb 9.6 oz (91.4 kg)   SpO2 98%   BMI 34.60 kg/m  Wt Readings from Last 3 Encounters:  12/14/19 201 lb 9.6 oz (91.4 kg)  10/09/19 207 lb (93.9 kg)  10/03/19 207 lb (93.9 kg)   She lost 6 pounds in 2 months and she was encouraged to continue on her weight loss regimen.   Health Maintenance Due  Topic Date Due  . Hepatitis C Screening  Never done  . TETANUS/TDAP  Never done  . PAP SMEAR-Modifier  Never done  . COLONOSCOPY  Never done    There are no preventive care reminders to display  for this patient.  Lab Results  Component Value Date   TSH 0.048 (L) 12/07/2019   Lab Results  Component Value Date   WBC 5.1 12/07/2019   HGB 12.5 12/07/2019   HCT 38.3 12/07/2019   MCV 86 12/07/2019   PLT 418 12/07/2019   Lab Results  Component Value Date   NA 142 12/07/2019   K 4.4 12/07/2019   CO2 27 12/07/2019   GLUCOSE 103 (H) 12/07/2019  BUN 8 12/07/2019   CREATININE 0.97 12/07/2019   BILITOT 0.4 12/07/2019   ALKPHOS 88 12/07/2019   AST 14 12/07/2019   ALT 12 12/07/2019   PROT 7.0 12/07/2019   ALBUMIN 4.3 12/07/2019   CALCIUM 9.7 12/07/2019   ANIONGAP 9 10/13/2019   Lab Results  Component Value Date   CHOL 83 10/10/2019   Lab Results  Component Value Date   HDL 24 (L) 10/10/2019   Lab Results  Component Value Date   LDLCALC 48 10/10/2019   Lab Results  Component Value Date   TRIG 53 10/10/2019   Lab Results  Component Value Date   CHOLHDL 3.5 10/10/2019   Lab Results  Component Value Date   HGBA1C 6.0 (H) 12/07/2019      Assessment & Plan:   1. Essential hypertension - Her blood pressure is under control and she will continue on current treatment regimen. She was advised to continue on low salt diet, increase water intake and exercise as tolerated.  2. Hypothyroidism, unspecified type - Her Levothyroxine was decreased to 75 mcg, and will recheck TSH in 6 weeks. - TSH; Future - levothyroxine (SYNTHROID) 75 MCG tablet; TAKE ONE TABLET BY MOUTH EVERY DAY BEFORE BREAKFAST.  Dispense: 54 tablet; Refill: 0 - TSH  3. Prediabetes - Her HgbA1c was 6% and she will continue on current medication. She was advised to continue on low carb/non concentrated sweet diet and exercise as tolerated.  4. Hand and leg cramps  - Likely due to Covid side effects, or myalgia. Will recheck her Lipid panel. She was advised to notify clinic for worsening symptoms. - Lipid panel; Future - Lipid panel    Follow-up: Return in about 7 weeks (around 02/01/2020), or  if symptoms worsen or fail to improve.    Tekia [place]bury Jerold Coombe, NP

## 2019-12-14 NOTE — Patient Instructions (Signed)
Hypothyroidism  Hypothyroidism is when the thyroid gland does not make enough of certain hormones (it is underactive). The thyroid gland is a small gland located in the lower front part of the neck, just in front of the windpipe (trachea). This gland makes hormones that help control how the body uses food for energy (metabolism) as well as how the heart and brain function. These hormones also play a role in keeping your bones strong. When the thyroid is underactive, it produces too little of the hormones thyroxine (T4) and triiodothyronine (T3). What are the causes? This condition may be caused by:  Hashimoto's disease. This is a disease in which the body's disease-fighting system (immune system) attacks the thyroid gland. This is the most common cause.  Viral infections.  Pregnancy.  Certain medicines.  Birth defects.  Past radiation treatments to the head or neck for cancer.  Past treatment with radioactive iodine.  Past exposure to radiation in the environment.  Past surgical removal of part or all of the thyroid.  Problems with a gland in the center of the brain (pituitary gland).  Lack of enough iodine in the diet. What increases the risk? You are more likely to develop this condition if:  You are female.  You have a family history of thyroid conditions.  You use a medicine called lithium.  You take medicines that affect the immune system (immunosuppressants). What are the signs or symptoms? Symptoms of this condition include:  Feeling as though you have no energy (lethargy).  Not being able to tolerate cold.  Weight gain that is not explained by a change in diet or exercise habits.  Lack of appetite.  Dry skin.  Coarse hair.  Menstrual irregularity.  Slowing of thought processes.  Constipation.  Sadness or depression. How is this diagnosed? This condition may be diagnosed based on:  Your symptoms, your medical history, and a physical exam.  Blood  tests. You may also have imaging tests, such as an ultrasound or MRI. How is this treated? This condition is treated with medicine that replaces the thyroid hormones that your body does not make. After you begin treatment, it may take several weeks for symptoms to go away. Follow these instructions at home:  Take over-the-counter and prescription medicines only as told by your health care provider.  If you start taking any new medicines, tell your health care provider.  Keep all follow-up visits as told by your health care provider. This is important. ? As your condition improves, your dosage of thyroid hormone medicine may change. ? You will need to have blood tests regularly so that your health care provider can monitor your condition. Contact a health care provider if:  Your symptoms do not get better with treatment.  You are taking thyroid replacement medicine and you: ? Sweat a lot. ? Have tremors. ? Feel anxious. ? Lose weight rapidly. ? Cannot tolerate heat. ? Have emotional swings. ? Have diarrhea. ? Feel weak. Get help right away if you have:  Chest pain.  An irregular heartbeat.  A rapid heartbeat.  Difficulty breathing. Summary  Hypothyroidism is when the thyroid gland does not make enough of certain hormones (it is underactive).  When the thyroid is underactive, it produces too little of the hormones thyroxine (T4) and triiodothyronine (T3).  The most common cause is Hashimoto's disease, a disease in which the body's disease-fighting system (immune system) attacks the thyroid gland. The condition can also be caused by viral infections, medicine, pregnancy, or past   radiation treatment to the head or neck.  Symptoms may include weight gain, dry skin, constipation, feeling as though you do not have energy, and not being able to tolerate cold.  This condition is treated with medicine to replace the thyroid hormones that your body does not make. This information  is not intended to replace advice given to you by your health care provider. Make sure you discuss any questions you have with your health care provider. Document Revised: 08/21/2017 Document Reviewed: 08/19/2017 Elsevier Patient Education  2020 [employer]  Prediabetes Eating Plan Prediabetes is a condition that causes blood sugar (glucose) levels to be higher than normal. This increases the risk for developing diabetes. In order to prevent diabetes from developing, your health care provider may recommend a diet and other lifestyle changes to help you:  Control your blood glucose levels.  Improve your cholesterol levels.  Manage your blood pressure. Your health care provider may recommend working with a diet and nutrition specialist (dietitian) to make a meal plan that is best for you. What are tips for following this plan? Lifestyle  Set weight loss goals with the help of your health care team. It is recommended that most people with prediabetes lose 7% of their current body weight.  Exercise for at least 30 minutes at least 5 days a week.  Attend a support group or seek ongoing support from a mental health counselor.  Take over-the-counter and prescription medicines only as told by your health care provider. Reading food labels  Read food labels to check the amount of fat, salt (sodium), and sugar in prepackaged foods. Avoid foods that have: ? Saturated fats. ? Trans fats. ? Added sugars.  Avoid foods that have more than 300 milligrams (mg) of sodium per serving. Limit your daily sodium intake to less than 2,300 mg each day. Shopping  Avoid buying pre-made and processed foods. Cooking  Cook with olive oil. Do not use butter, lard, or ghee.  Bake, broil, grill, or boil foods. Avoid frying. Meal planning   Work with your dietitian to develop an eating plan that is right for you. This may include: ? Tracking how many calories you take in. Use a food diary, notebook, or  mobile application to track what you eat at each meal. ? Using the glycemic index (GI) to plan your meals. The index tells you how quickly a food will raise your blood glucose. Choose low-GI foods. These foods take a longer time to raise blood glucose.  Consider following a Mediterranean diet. This diet includes: ? Several servings each day of fresh fruits and vegetables. ? Eating fish at least twice a week. ? Several servings each day of whole grains, beans, nuts, and seeds. ? Using olive oil instead of other fats. ? Moderate alcohol consumption. ? Eating small amounts of red meat and whole-fat dairy.  If you have high blood pressure, you may need to limit your sodium intake or follow a diet such as the DASH eating plan. DASH is an eating plan that aims to lower high blood pressure. What foods are recommended? The items listed below may not be a complete list. Talk with your dietitian about what dietary choices are best for you. Grains Whole grains, such as whole-wheat or whole-grain breads, crackers, cereals, and pasta. Unsweetened oatmeal. Bulgur. Barley. Quinoa. Brown rice. Corn or whole-wheat flour tortillas or taco shells. Vegetables Lettuce. Spinach. Peas. Beets. Cauliflower. Cabbage. Broccoli. Carrots. Tomatoes. Squash. Eggplant. Herbs. Peppers. Onions. Cucumbers. Brussels sprouts. Fruits Berries.  Bananas. Apples. Oranges. Grapes. Papaya. Mango. Pomegranate. Kiwi. Grapefruit. Cherries. Meats and other protein foods Seafood. Poultry without skin. Lean cuts of pork and beef. Tofu. Eggs. Nuts. Beans. Dairy Low-fat or fat-free dairy products, such as yogurt, cottage cheese, and cheese. Beverages Water. Tea. Coffee. Sugar-free or diet soda. Seltzer water. Lowfat or no-fat milk. Milk alternatives, such as soy or almond milk. Fats and oils Olive oil. Canola oil. Sunflower oil. Grapeseed oil. Avocado. Walnuts. Sweets and desserts Sugar-free or low-fat pudding. Sugar-free or low-fat ice  cream and other frozen treats. Seasoning and other foods Herbs. Sodium-free spices. Mustard. Relish. Low-fat, low-sugar ketchup. Low-fat, low-sugar barbecue sauce. Low-fat or fat-free mayonnaise. What foods are not recommended? The items listed below may not be a complete list. Talk with your dietitian about what dietary choices are best for you. Grains Refined white flour and flour products, such as bread, pasta, snack foods, and cereals. Vegetables Canned vegetables. Frozen vegetables with butter or cream sauce. Fruits Fruits canned with syrup. Meats and other protein foods Fatty cuts of meat. Poultry with skin. Breaded or fried meat. Processed meats. Dairy Full-fat yogurt, cheese, or milk. Beverages Sweetened drinks, such as sweet iced tea and soda. Fats and oils Butter. Lard. Ghee. Sweets and desserts Baked goods, such as cake, cupcakes, pastries, cookies, and cheesecake. Seasoning and other foods Spice mixes with added salt. Ketchup. Barbecue sauce. Mayonnaise. Summary  To prevent diabetes from developing, you may need to make diet and other lifestyle changes to help control blood sugar, improve cholesterol levels, and manage your blood pressure.  Set weight loss goals with the help of your health care team. It is recommended that most people with prediabetes lose 7 percent of their current body weight.  Consider following a Mediterranean diet that includes plenty of fresh fruits and vegetables, whole grains, beans, nuts, seeds, fish, lean meat, low-fat dairy, and healthy oils. This information is not intended to replace advice given to you by your health care provider. Make sure you discuss any questions you have with your health care provider. Document Revised: 12/31/2018 Document Reviewed: 11/12/2016 Elsevier Patient Education  2020 [employer].

## 2019-12-15 LAB — LIPID PANEL
Chol/HDL Ratio: 3.3 ratio (ref 0.0–4.4)
Cholesterol, Total: 130 mg/dL (ref 100–199)
HDL: 39 mg/dL — ABNORMAL LOW (ref >39)
LDL Chol Calc (NIH): 72 mg/dL (ref 0–99)
Triglycerides: 104 mg/dL (ref 0–149)
VLDL Cholesterol Cal: 19 mg/dL (ref 5–40)

## 2019-12-15 LAB — TSH: TSH: 0.073 u[IU]/mL — ABNORMAL LOW (ref 0.450–4.500)

## 2019-12-26 ENCOUNTER — Other Ambulatory Visit: Payer: Self-pay

## 2019-12-27 ENCOUNTER — Ambulatory Visit: Payer: Medicaid Other | Attending: Oncology

## 2019-12-27 VITALS — BP 127/80 | HR 87 | Temp 97.8°F | Ht 65.5 in | Wt 191.1 lb

## 2019-12-27 DIAGNOSIS — N63 Unspecified lump in unspecified breast: Secondary | ICD-10-CM

## 2019-12-27 DIAGNOSIS — N6321 Unspecified lump in the left breast, upper outer quadrant: Secondary | ICD-10-CM

## 2019-12-27 NOTE — Progress Notes (Signed)
  Subjective:     Patient ID: Rebekah Salinas, female   DOB: 22-Dec-1955, 64 y.o.   MRN: [id number]  HPI   Review of Systems     Objective:   Physical Exam Chest:     Breasts:        Right: No swelling, bleeding, inverted nipple, mass, nipple discharge, skin change or tenderness.        Left: No swelling, bleeding, inverted nipple, mass, nipple discharge, skin change or tenderness.       Comments: Biopsy scar lower outer left breast       Assessment:     64 year old patient presents for [facility] clinic visit. She is here for annual follow-up  Of benign left breast biopsy with Dr. Aleen Campi. Patient screened, and meets [facility] eligibility.  Patient does not have insurance, Medicare or Medicaid. Instructed patient on breast self awareness using teach back method. Clinical breast exam unremarkable. No mass or lump palpated.  Risk Assessment    Risk Scores      12/27/2019   Last edited by: Alta Corning, CMA   5-year risk: 2.6 %   Lifetime risk: 10.9 %            Plan:     Scheduled to return for mammogram, and ultrasound on 01/06/20. Reminder given to patient.

## 2019-12-30 ENCOUNTER — Ambulatory Visit: Payer: Medicaid Other | Attending: Internal Medicine

## 2019-12-30 DIAGNOSIS — Z23 Encounter for immunization: Secondary | ICD-10-CM

## 2019-12-30 NOTE — Progress Notes (Signed)
   Covid-19 Vaccination Clinic  Name:  Rebekah Salinas    MRN: [id number] DOB: 05-14-1956  12/30/2019  Rebekah Salinas was observed post Covid-19 immunization for 15 minutes without incident. She was provided with Vaccine Information Sheet and instruction to access the V-Safe system.   Rebekah Salinas was instructed to call 911 with any severe reactions post vaccine: Marland Kitchen Difficulty breathing  . Swelling of face and throat  . A fast heartbeat  . A bad rash all over body  . Dizziness and weakness   Immunizations Administered    Name Date Dose VIS Date Route   Pfizer COVID-19 Vaccine 12/30/2019 12:02 PM 0.3 mL 09/02/2019 Intramuscular   Manufacturer: [employer], [employer]   Lot: [id number]   NDC: [address]-8

## 2020-01-04 ENCOUNTER — Ambulatory Visit: Payer: Self-pay | Admitting: Surgery

## 2020-01-06 ENCOUNTER — Ambulatory Visit
Admission: RE | Admit: 2020-01-06 | Discharge: 2020-01-06 | Disposition: A | Discharge status: Home / Self Care | Payer: Medicaid Other | Source: Ambulatory Visit | Attending: Oncology | Admitting: Oncology

## 2020-01-06 DIAGNOSIS — N63 Unspecified lump in unspecified breast: Secondary | ICD-10-CM

## 2020-01-10 NOTE — Progress Notes (Signed)
Letter mailed from [facility] to notify of normal mammogram results.  Patient to return in one year for annual screening.  Copy to [facility]. 

## 2020-01-11 ENCOUNTER — Encounter: Payer: Self-pay | Admitting: Surgery

## 2020-01-11 ENCOUNTER — Other Ambulatory Visit: Payer: Self-pay

## 2020-01-11 ENCOUNTER — Ambulatory Visit (INDEPENDENT_AMBULATORY_CARE_PROVIDER_SITE_OTHER): Payer: Self-pay | Admitting: Surgery

## 2020-01-11 VITALS — BP 121/78 | HR 87 | Temp 97.9°F | Resp 14 | Ht 64.0 in | Wt 199.8 lb

## 2020-01-11 DIAGNOSIS — N6320 Unspecified lump in the left breast, unspecified quadrant: Secondary | ICD-10-CM

## 2020-01-11 NOTE — Patient Instructions (Addendum)
Unless you have any problems we do not need to see you back.   Continue self breast exams. Call office for any new breast issues or concerns.  Due for mammogram in one year, to be set up through [facility] or PCP.

## 2020-01-11 NOTE — Progress Notes (Signed)
01/11/2020  History of Present Illness: Rebekah Salinas is a 64 y.o. female presenting for follow up of a left breast mass at 2 o'clock, 6 cm from nipple.  It had been followed up by mammogram and had been stable.  However, given the patient's family history of breast cancer, she wanted to have a biopsy to further evaluate.  She underwent biopsy in 05/2019 which showed fibrocystic changes, benign calcifications, and no atypia or malignancy.  She underwent follow up mammogram on 01/06/20 and presents to discuss.  She denies any palpable masses, skin changes, or nipple changes.  She does endorse occasional left breast discomfort, but it's short duration, around the area of biopsy.   Past Medical History: Past Medical History:  Diagnosis Date  . Diabetes mellitus without complication ([place])   . Hypertension   . Thyroid disease      Past Surgical History: Past Surgical History:  Procedure Laterality Date  . ABDOMINAL HYSTERECTOMY    . BREAST BIOPSY Left    neg  . BREAST BIOPSY Left    core bx- neg  . BREAST BIOPSY Left 06/13/2019   [place] bx 2:00 coil   . TUBAL LIGATION      Home Medications: Prior to Admission medications   Medication Sig Start Date End Date Taking? Authorizing Provider  acetaminophen (TYLENOL) 325 MG tablet Take 650 mg by mouth every 6 (six) hours as needed.   Yes [provider]  ascorbic acid (VITAMIN C) 500 MG tablet Take 1 tablet (500 mg total) by mouth daily. 10/13/19  Yes Dhungel, Nishant, MD  atorvastatin (LIPITOR) 40 MG tablet Take 1 tablet (40 mg total) by mouth daily. 09/29/19  Yes Iloabachie, Chioma E, NP  cyclobenzaprine (FLEXERIL) 10 MG tablet TAKE ONE TABLET BY MOUTH 2 TIMES A DAY 11/22/19  Yes Iloabachie, Chioma E, NP  levothyroxine (SYNTHROID) 75 MCG tablet TAKE ONE TABLET BY MOUTH EVERY DAY BEFORE BREAKFAST. 12/14/19  Yes Iloabachie, Chioma E, NP  lisinopril (ZESTRIL) 5 MG tablet Take 1 tablet (5 mg total) by mouth daily. 09/29/19  Yes Iloabachie, Chioma  E, NP  metFORMIN (GLUCOPHAGE) 500 MG tablet TAKE TWO TABLETS (1000MG ) BY MOUTH EVERY DAY WITH BREAKFAST. 09/29/19  Yes Iloabachie, Chioma E, NP  omeprazole (PRILOSEC) 20 MG capsule Take 1 capsule (20 mg total) by mouth daily. 09/29/19  Yes Iloabachie, Chioma E, NP  primidone (MYSOLINE) 50 MG tablet TAKE ONE TABLET BY MOUTH 2 TIMES A DAY Patient taking differently: Take 50 mg by mouth 2 (two) times daily. TAKE ONE TABLET BY MOUTH 2 TIMES A DAY 09/29/19  Yes Iloabachie, Chioma E, NP  zinc sulfate 220 (50 Zn) MG capsule Take 1 capsule (220 mg total) by mouth daily. 10/13/19  Yes Dhungel, Flonnie Overman, MD    Allergies: Allergies  Allergen Reactions  . Aspirin Nausea And Vomiting    Review of Systems: Review of Systems  Constitutional: Negative for chills and fever.  Respiratory: Negative for shortness of breath.   Cardiovascular: Negative for chest pain.  Gastrointestinal: Negative for abdominal pain, nausea and vomiting.  Skin: Negative for rash.    Physical Exam BP 121/78   Pulse 87   Temp 97.9 F (36.6 C)   Resp 14   Ht 5\' 4"  (1.626 m)   Wt 199 lb 12.8 oz (90.6 kg)   SpO2 95%   BMI 34.30 kg/m  CONSTITUTIONAL: No acute distress HEENT:  Normocephalic, atraumatic, extraocular motion intact. RESPIRATORY:  Lungs are clear, and breath sounds are equal bilaterally. Normal respiratory  effort without pathologic use of accessory muscles. CARDIOVASCULAR: Heart is regular without murmurs, gallops, or rubs. BREAST: Left breast without palpable mass, skin changes, or nipple drainage/changes.  No left axillary or supraclavicular lymphadenopathy.  Right breast without palpable mass, skin changes, or nipple drainage/changes.  No right axillary or supraclavicular lymphadenopathy. NEUROLOGIC:  Motor and sensation is grossly normal.  Cranial nerves are grossly intact. PSYCH:  Alert and oriented to person, place and time. Affect is normal.  Labs/Imaging: Mammogram 01/06/20: FINDINGS: Right breast: No  suspicious mass, distortion, or microcalcifications are identified to suggest presence of malignancy.  Left breast: The asymmetry previously seen only on the MLO view in the central left breast middle depth is less conspicuous compared to the original exam on April 2019. There are no new suspicious findings in the left breast. There are two biopsy marking clips in the left breast from prior benign biopsies.  Mammographic images were processed with CAD.  IMPRESSION: No mammographic evidence of malignancy in the bilateral breasts.  RECOMMENDATION: Screening mammogram in one year.  Assessment and Plan: This is a 64 y.o. female with history of left breast mass, s/p biopsy in 05/2019, which was negative.  --Discussed with the patient that the mammogram from last week is reassuring, and her exam today is benign.  No other biopsies needed at this point. --Given the benign findings and negative exam and mammogram, we will defer future mammograms and exams to her PCP again, and if there are any issues or concerns, she can definitely call us and we'll be happy to be of any assistance needed. --Follow up prn.  Face-to-face time spent with the patient and care providers was 15 minutes, with more than 50% of the time spent counseling, educating, and coordinating care of the patient.     Howie Ill, MD [facility]

## 2020-01-18 ENCOUNTER — Ambulatory Visit: Payer: Self-pay | Admitting: Gerontology

## 2020-01-25 ENCOUNTER — Other Ambulatory Visit: Payer: Self-pay

## 2020-01-25 ENCOUNTER — Other Ambulatory Visit: Payer: Medicaid Other

## 2020-01-25 VITALS — BP 124/83 | HR 88 | Temp 98.3°F | Ht 65.0 in | Wt 199.2 lb

## 2020-01-25 DIAGNOSIS — I10 Essential (primary) hypertension: Secondary | ICD-10-CM

## 2020-01-26 LAB — TSH: TSH: 0.717 u[IU]/mL (ref 0.450–4.500)

## 2020-01-26 LAB — LIPID PANEL
Chol/HDL Ratio: 2.9 ratio (ref 0.0–4.4)
Cholesterol, Total: 135 mg/dL (ref 100–199)
HDL: 46 mg/dL (ref >39)
LDL Chol Calc (NIH): 72 mg/dL (ref 0–99)
Triglycerides: 87 mg/dL (ref 0–149)
VLDL Cholesterol Cal: 17 mg/dL (ref 5–40)

## 2020-02-01 ENCOUNTER — Other Ambulatory Visit: Payer: Self-pay

## 2020-02-01 ENCOUNTER — Encounter: Payer: Self-pay | Admitting: Gerontology

## 2020-02-01 ENCOUNTER — Other Ambulatory Visit: Payer: Self-pay | Admitting: Gerontology

## 2020-02-01 ENCOUNTER — Ambulatory Visit: Payer: Medicaid Other | Admitting: Gerontology

## 2020-02-01 VITALS — BP 119/79 | HR 81 | Temp 98.6°F | Ht 65.0 in | Wt 203.1 lb

## 2020-02-01 DIAGNOSIS — Z8639 Personal history of other endocrine, nutritional and metabolic disease: Secondary | ICD-10-CM

## 2020-02-01 DIAGNOSIS — R7303 Prediabetes: Secondary | ICD-10-CM

## 2020-02-01 DIAGNOSIS — E039 Hypothyroidism, unspecified: Secondary | ICD-10-CM

## 2020-02-01 DIAGNOSIS — Z8719 Personal history of other diseases of the digestive system: Secondary | ICD-10-CM

## 2020-02-01 MED ORDER — METFORMIN HCL 1000 MG PO TABS: ORAL_TABLET | ORAL | 1 refills | Status: DC

## 2020-02-01 MED ORDER — ATORVASTATIN CALCIUM 40 MG PO TABS: 40.0000 mg | ORAL_TABLET | Freq: Every day | ORAL | 1 refills | Status: DC

## 2020-02-01 MED ORDER — OMEPRAZOLE 20 MG PO CPDR: 20.0000 mg | DELAYED_RELEASE_CAPSULE | Freq: Every day | ORAL | 1 refills | Status: DC

## 2020-02-01 MED ORDER — LEVOTHYROXINE SODIUM 75 MCG PO TABS: ORAL_TABLET | ORAL | 1 refills | Status: DC

## 2020-02-01 NOTE — Patient Instructions (Signed)

## 2020-02-01 NOTE — Progress Notes (Signed)
Established Patient Office Visit  Subjective:  Patient ID: Rebekah Salinas, female    DOB: 04-May-1956  Age: 64 y.o. MRN: [id number]  CC:  Chief Complaint  Patient presents with  . Follow-up    HPI [name] presents for follow up of hypothyroidism, lab review and medication refill. Her TSH done on 01/25/2020 was 0.717 and she states that she takes 75 mcg of Levothyroxine as prescribed. She denies heart palpitation, heat/cold intolerance and low energy. She states that she's compliant with her medications and continues to make healthy lifestyle choices. She was seen by the General Surgery Dr Richarda Blade on 01/11/2020 for left breast mass at 2 0' clock, 6 cm from nipple and he discussed with the patient that the mammogram from last week is reassuring, and her exam today is benign.  No other biopsies needed at this point. She had Mammogram done on 01/06/2020 and there is no mammographic evidence of malignancy in the bilateral breasts per Dr Gavin Potters. Overall, she states that she's doing well and offers no further complaint.     Past Medical History:  Diagnosis Date  . Diabetes mellitus without complication (HCC)   . Hypertension   . Thyroid disease     Past Surgical History:  Procedure Laterality Date  . ABDOMINAL HYSTERECTOMY    . BREAST BIOPSY Left    neg  . BREAST BIOPSY Left    core bx- neg  . BREAST BIOPSY Left 06/13/2019   [place] bx 2:00 coil   . TUBAL LIGATION      Family History  Problem Relation Age of Onset  . CAD Sister   . Breast cancer Sister 69       twin sister  . Cancer Sister   . Cancer Maternal Grandmother     Social History   Socioeconomic History  . Marital status: Married    Spouse name: Not on file  . Number of children: Not on file  . Years of education: Not on file  . Highest education level: Not on file  Occupational History  . Not on file  Tobacco Use  . Smoking status: Never Smoker  . Smokeless tobacco: Never Used  Substance and Sexual  Activity  . Alcohol use: Never  . Drug use: Never  . Sexual activity: Yes    Birth control/protection: None  Other Topics Concern  . Not on file  Social History Narrative  . Not on file   Social Determinants of Health   Financial Resource Strain:   . Difficulty of Paying Living Expenses:   Food Insecurity:   . Worried About Programme researcher, broadcasting/film/video in the Last Year:   . Barista in the Last Year:   Transportation Needs:   . Freight forwarder (Medical):   Marland Kitchen Lack of Transportation (Non-Medical):   Physical Activity:   . Days of Exercise per Week:   . Minutes of Exercise per Session:   Stress:   . Feeling of Stress :   Social Connections:   . Frequency of Communication with Friends and Family:   . Frequency of Social Gatherings with Friends and Family:   . Attends Religious Services:   . Active Member of Clubs or Organizations:   . Attends Banker Meetings:   Marland Kitchen Marital Status:   Intimate Partner Violence:   . Fear of Current or Ex-Partner:   . Emotionally Abused:   Marland Kitchen Physically Abused:   . Sexually Abused:     Outpatient  Medications Prior to Visit  Medication Sig Dispense Refill  . acetaminophen (TYLENOL) 325 MG tablet Take 650 mg by mouth every 6 (six) hours as needed.    Marland Kitchen ascorbic acid (VITAMIN C) 500 MG tablet Take 1 tablet (500 mg total) by mouth daily. 30 tablet 0  . cholecalciferol (VITAMIN D3) 25 MCG (1000 UNIT) tablet Take 1,000 Units by mouth daily.    . cyclobenzaprine (FLEXERIL) 10 MG tablet TAKE ONE TABLET BY MOUTH 2 TIMES A DAY 60 tablet 0  . lisinopril (ZESTRIL) 5 MG tablet Take 1 tablet (5 mg total) by mouth daily. 90 tablet 1  . primidone (MYSOLINE) 50 MG tablet TAKE ONE TABLET BY MOUTH 2 TIMES A DAY (Patient taking differently: Take 50 mg by mouth 2 (two) times daily. TAKE ONE TABLET BY MOUTH 2 TIMES A DAY) 120 tablet 1  . zinc sulfate 220 (50 Zn) MG capsule Take 1 capsule (220 mg total) by mouth daily. 30 capsule 0  . atorvastatin  (LIPITOR) 40 MG tablet Take 1 tablet (40 mg total) by mouth daily. 90 tablet 1  . levothyroxine (SYNTHROID) 75 MCG tablet TAKE ONE TABLET BY MOUTH EVERY DAY BEFORE BREAKFAST. 54 tablet 0  . metFORMIN (GLUCOPHAGE) 500 MG tablet TAKE TWO TABLETS (1000MG ) BY MOUTH EVERY DAY WITH BREAKFAST. 120 tablet 0  . omeprazole (PRILOSEC) 20 MG capsule Take 1 capsule (20 mg total) by mouth daily. 90 capsule 1   No facility-administered medications prior to visit.    Allergies  Allergen Reactions  . Aspirin Nausea And Vomiting    ROS Review of Systems  Constitutional: Negative.   Eyes: Negative.   Respiratory: Negative.   Cardiovascular: Negative.   Endocrine: Negative.   Neurological: Negative.   Psychiatric/Behavioral: Negative.       Objective:    Physical Exam  Constitutional: She is oriented to person, place, and time. She appears well-developed.  HENT:  Head: Normocephalic and atraumatic.  Eyes: Pupils are equal, round, and reactive to light. EOM are normal.  Cardiovascular: Normal rate and regular rhythm.  Pulmonary/Chest: Effort normal and breath sounds normal.  Neurological: She is alert and oriented to person, place, and time.  Psychiatric: She has a normal mood and affect. Her behavior is normal. Judgment and thought content normal.    BP 119/79 (BP Location: Left Arm, Patient Position: Sitting, Cuff Size: Large)   Pulse 81   Temp 98.6 F (37 C)   Ht 5\' 5"  (1.651 m)   Wt 203 lb 1.6 oz (92.1 kg)   SpO2 98%   BMI 33.80 kg/m  Wt Readings from Last 3 Encounters:  02/01/20 203 lb 1.6 oz (92.1 kg)  01/25/20 199 lb 3.2 oz (90.4 kg)  01/11/20 199 lb 12.8 oz (90.6 kg)   She was encouraged to continue on a weight loss regimen.  Health Maintenance Due  Topic Date Due  . Hepatitis C Screening  Never done  . TETANUS/TDAP  Never done  . COLONOSCOPY  Never done    There are no preventive care reminders to display for this patient.  Lab Results  Component Value Date   TSH  0.717 01/25/2020   Lab Results  Component Value Date   WBC 5.1 12/07/2019   HGB 12.5 12/07/2019   HCT 38.3 12/07/2019   MCV 86 12/07/2019   PLT 418 12/07/2019   Lab Results  Component Value Date   NA 142 12/07/2019   K 4.4 12/07/2019   CO2 27 12/07/2019   GLUCOSE 103 (H)  12/07/2019   BUN 8 12/07/2019   CREATININE 0.97 12/07/2019   BILITOT 0.4 12/07/2019   ALKPHOS 88 12/07/2019   AST 14 12/07/2019   ALT 12 12/07/2019   PROT 7.0 12/07/2019   ALBUMIN 4.3 12/07/2019   CALCIUM 9.7 12/07/2019   ANIONGAP 9 10/13/2019   Lab Results  Component Value Date   CHOL 135 01/25/2020   Lab Results  Component Value Date   HDL 46 01/25/2020   Lab Results  Component Value Date   LDLCALC 72 01/25/2020   Lab Results  Component Value Date   TRIG 87 01/25/2020   Lab Results  Component Value Date   CHOLHDL 2.9 01/25/2020   Lab Results  Component Value Date   HGBA1C 6.0 (H) 12/07/2019      Assessment & Plan:    1. History of elevated lipids - She will continue on current treatment regimen, was advised to continue on -Low fat Diet, like low fat dairy products eg skimmed milk -Avoid any fried food -Regular exercise/walk -Goal for Total Cholesterol is less than 200 -Goal for bad cholesterol LDL is less than 70 -Goal for Good cholesterol HDL is more than 45 -Goal for Triglyceride is less than 150 - atorvastatin (LIPITOR) 40 MG tablet; Take 1 tablet (40 mg total) by mouth daily.  Dispense: 90 tablet; Refill: 1  2. Hypothyroidism, unspecified type - She is Euthyroid and will continue on current medication. - levothyroxine (SYNTHROID) 75 MCG tablet; TAKE ONE TABLET BY MOUTH EVERY DAY BEFORE BREAKFAST.  Dispense: 90 tablet; Refill: 1  3. Prediabetes - Her HgbA1c was 6%, she will continue on current treatment regimen and was advised to continue on low carb/ non concentrated sweet diet and exercise as tolerated. - metFORMIN (GLUCOPHAGE) 1000 MG tablet; TAKE One TABLET (1000MG ) BY  MOUTH EVERY DAY WITH BREAKFAST.  Dispense: 90 tablet; Refill: 1 - HgB A1c; Future  4. History of gastroesophageal reflux (GERD) - Her Jerrye Bushy is under control, and she will continue on current treatment regimen. -Avoid spicy, fatty and fried food -Avoid sodas and sour juices -Avoid heavy meals -Avoid eating 4 hours before bedtime -Elevate head of bed at night - omeprazole (PRILOSEC) 20 MG capsule; Take 1 capsule (20 mg total) by mouth daily.  Dispense: 90 capsule; Refill: 1    Follow-up: Return in about 3 months (around 05/15/2020), or if symptoms worsen or fail to improve.    Analena Gama Jerold Coombe, NP

## 2020-04-16 ENCOUNTER — Other Ambulatory Visit: Payer: Self-pay | Admitting: Gerontology

## 2020-04-16 DIAGNOSIS — M25512 Pain in left shoulder: Secondary | ICD-10-CM

## 2020-04-16 DIAGNOSIS — I10 Essential (primary) hypertension: Secondary | ICD-10-CM

## 2020-04-16 DIAGNOSIS — G8929 Other chronic pain: Secondary | ICD-10-CM

## 2020-04-16 DIAGNOSIS — M542 Cervicalgia: Secondary | ICD-10-CM

## 2020-04-17 ENCOUNTER — Other Ambulatory Visit: Payer: Self-pay | Admitting: Gerontology

## 2020-05-09 ENCOUNTER — Other Ambulatory Visit: Payer: Self-pay

## 2020-05-09 ENCOUNTER — Other Ambulatory Visit: Payer: Medicaid Other

## 2020-05-09 DIAGNOSIS — R7303 Prediabetes: Secondary | ICD-10-CM

## 2020-05-10 ENCOUNTER — Other Ambulatory Visit: Payer: Self-pay | Admitting: Gerontology

## 2020-05-10 DIAGNOSIS — E039 Hypothyroidism, unspecified: Secondary | ICD-10-CM

## 2020-05-10 LAB — HEMOGLOBIN A1C
Est. average glucose Bld gHb Est-mCnc: 134 mg/dL
Hgb A1c MFr Bld: 6.3 % — ABNORMAL HIGH (ref 4.8–5.6)

## 2020-05-10 MED ORDER — LEVOTHYROXINE SODIUM 75 MCG PO TABS: ORAL_TABLET | ORAL | 1 refills | Status: DC

## 2020-05-15 ENCOUNTER — Ambulatory Visit: Payer: Medicaid Other | Admitting: Gerontology

## 2020-05-17 ENCOUNTER — Other Ambulatory Visit: Payer: Self-pay

## 2020-05-17 ENCOUNTER — Ambulatory Visit: Payer: Medicaid Other | Admitting: Gerontology

## 2020-05-17 DIAGNOSIS — R7303 Prediabetes: Secondary | ICD-10-CM

## 2020-05-17 NOTE — Patient Instructions (Signed)

## 2020-05-17 NOTE — Progress Notes (Signed)
Established Patient Office Visit  Subjective:  Patient ID: Rebekah Salinas, female    DOB: 29-Jun-1956  Age: 64 y.o. MRN: [id number]  CC: No chief complaint on file.  Patient consents to telephone visit and 2 patient identifiers was used to identify patient.  HPI Rebekah Salinas presents for follow up of Prediabetes, lab review and medication refill. Her HgbA1c done on 05/09/2020 increased from 6% to 6.3%. She states that she's compliant with her medication regimen, but has not been adhering to ADA diet. She denies hypoglycemia/hyperglycemic symptoms, peripheral neuropathy and performs daily foot checks. Overall, she states that she's doing well and offers no further complaint.    Past Medical History:  Diagnosis Date  . Diabetes mellitus without complication (HCC)   . Hypertension   . Thyroid disease     Past Surgical History:  Procedure Laterality Date  . ABDOMINAL HYSTERECTOMY    . BREAST BIOPSY Left    neg  . BREAST BIOPSY Left    core bx- neg  . BREAST BIOPSY Left 06/13/2019   [place] bx 2:00 coil   . TUBAL LIGATION      Family History  Problem Relation Age of Onset  . CAD Sister   . Breast cancer Sister 3       twin sister  . Cancer Sister   . Cancer Maternal Grandmother     Social History   Socioeconomic History  . Marital status: Married    Spouse name: Not on file  . Number of children: Not on file  . Years of education: Not on file  . Highest education level: Not on file  Occupational History  . Not on file  Tobacco Use  . Smoking status: Never Smoker  . Smokeless tobacco: Never Used  Vaping Use  . Vaping Use: Never used  Substance and Sexual Activity  . Alcohol use: Never  . Drug use: Never  . Sexual activity: Yes    Birth control/protection: None  Other Topics Concern  . Not on file  Social History Narrative  . Not on file   Social Determinants of Health   Financial Resource Strain:   . Difficulty of Paying Living Expenses: Not on file   Food Insecurity:   . Worried About Programme researcher, broadcasting/film/video in the Last Year: Not on file  . Ran Out of Food in the Last Year: Not on file  Transportation Needs:   . Lack of Transportation (Medical): Not on file  . Lack of Transportation (Non-Medical): Not on file  Physical Activity:   . Days of Exercise per Week: Not on file  . Minutes of Exercise per Session: Not on file  Stress:   . Feeling of Stress : Not on file  Social Connections:   . Frequency of Communication with Friends and Family: Not on file  . Frequency of Social Gatherings with Friends and Family: Not on file  . Attends Religious Services: Not on file  . Active Member of Clubs or Organizations: Not on file  . Attends Banker Meetings: Not on file  . Marital Status: Not on file  Intimate Partner Violence:   . Fear of Current or Ex-Partner: Not on file  . Emotionally Abused: Not on file  . Physically Abused: Not on file  . Sexually Abused: Not on file    Outpatient Medications Prior to Visit  Medication Sig Dispense Refill  . acetaminophen (TYLENOL) 325 MG tablet Take 650 mg by mouth every 6 (six) hours as needed.    Marland Kitchen  ascorbic acid (VITAMIN C) 500 MG tablet Take 1 tablet (500 mg total) by mouth daily. 30 tablet 0  . atorvastatin (LIPITOR) 40 MG tablet Take 1 tablet (40 mg total) by mouth daily. 90 tablet 1  . cholecalciferol (VITAMIN D3) 25 MCG (1000 UNIT) tablet Take 1,000 Units by mouth daily.    . cyclobenzaprine (FLEXERIL) 10 MG tablet TAKE ONE TABLET BY MOUTH 2 TIMES A DAY 60 tablet 0  . levothyroxine (SYNTHROID) 75 MCG tablet TAKE ONE TABLET BY MOUTH EVERY DAY BEFORE BREAKFAST. 90 tablet 1  . lisinopril (ZESTRIL) 5 MG tablet TAKE ONE TABLET BY MOUTH EVERY DAY 90 tablet 0  . metFORMIN (GLUCOPHAGE) 1000 MG tablet TAKE One TABLET (1000MG ) BY MOUTH EVERY DAY WITH BREAKFAST. 90 tablet 1  . omeprazole (PRILOSEC) 20 MG capsule Take 1 capsule (20 mg total) by mouth daily. 90 capsule 1  . primidone (MYSOLINE)  50 MG tablet TAKE ONE TABLET BY MOUTH 2 TIMES A DAY (Patient taking differently: Take 50 mg by mouth 2 (two) times daily. TAKE ONE TABLET BY MOUTH 2 TIMES A DAY) 120 tablet 1  . zinc sulfate 220 (50 Zn) MG capsule Take 1 capsule (220 mg total) by mouth daily. 30 capsule 0   No facility-administered medications prior to visit.    Allergies  Allergen Reactions  . Aspirin Nausea And Vomiting    ROS Review of Systems  Constitutional: Negative.   Eyes: Negative.   Respiratory: Negative.   Cardiovascular: Negative.   Endocrine: Negative.   Skin: Negative.   Neurological: Negative.       Objective:    Physical Exam No physical exam was done There were no vitals taken for this visit. Wt Readings from Last 3 Encounters:  02/01/20 203 lb 1.6 oz (92.1 kg)  01/25/20 199 lb 3.2 oz (90.4 kg)  01/11/20 199 lb 12.8 oz (90.6 kg)     Health Maintenance Due  Topic Date Due  . Hepatitis C Screening  Never done  . TETANUS/TDAP  Never done  . PAP SMEAR-Modifier  Never done  . COLONOSCOPY  Never done  . INFLUENZA VACCINE  04/22/2020    There are no preventive care reminders to display for this patient.  Lab Results  Component Value Date   TSH 0.717 01/25/2020   Lab Results  Component Value Date   WBC 5.1 12/07/2019   HGB 12.5 12/07/2019   HCT 38.3 12/07/2019   MCV 86 12/07/2019   PLT 418 12/07/2019   Lab Results  Component Value Date   NA 142 12/07/2019   K 4.4 12/07/2019   CO2 27 12/07/2019   GLUCOSE 103 (H) 12/07/2019   BUN 8 12/07/2019   CREATININE 0.97 12/07/2019   BILITOT 0.4 12/07/2019   ALKPHOS 88 12/07/2019   AST 14 12/07/2019   ALT 12 12/07/2019   PROT 7.0 12/07/2019   ALBUMIN 4.3 12/07/2019   CALCIUM 9.7 12/07/2019   ANIONGAP 9 10/13/2019   Lab Results  Component Value Date   CHOL 135 01/25/2020   Lab Results  Component Value Date   HDL 46 01/25/2020   Lab Results  Component Value Date   LDLCALC 72 01/25/2020   Lab Results  Component Value  Date   TRIG 87 01/25/2020   Lab Results  Component Value Date   CHOLHDL 2.9 01/25/2020   Lab Results  Component Value Date   HGBA1C 6.3 (H) 05/09/2020      Assessment & Plan:   1. Prediabetes - Her HgbA1c was  6.3%, she will continue on current treatment regimen. She was encouraged to continue on low carb/non concentrated sweet diet and exercise as tolerated. - HgB A1c; Future    Follow-up: Return in about 3 months (around 08/29/2020), or if symptoms worsen or fail to improve.    Tyquan Carmickle Trellis Paganini, NP

## 2020-06-15 ENCOUNTER — Other Ambulatory Visit: Payer: Self-pay | Admitting: Gerontology

## 2020-06-15 DIAGNOSIS — G250 Essential tremor: Secondary | ICD-10-CM

## 2020-06-19 ENCOUNTER — Other Ambulatory Visit: Payer: Self-pay | Admitting: Gerontology

## 2020-08-22 ENCOUNTER — Other Ambulatory Visit: Payer: Medicaid Other

## 2020-08-29 ENCOUNTER — Ambulatory Visit: Payer: Medicaid Other | Admitting: Gerontology

## 2020-09-25 ENCOUNTER — Other Ambulatory Visit: Payer: Self-pay | Admitting: Gerontology

## 2020-09-25 DIAGNOSIS — G8929 Other chronic pain: Secondary | ICD-10-CM

## 2020-09-25 DIAGNOSIS — M545 Low back pain, unspecified: Secondary | ICD-10-CM

## 2020-09-25 DIAGNOSIS — Z8719 Personal history of other diseases of the digestive system: Secondary | ICD-10-CM

## 2020-09-25 DIAGNOSIS — M542 Cervicalgia: Secondary | ICD-10-CM

## 2020-09-26 ENCOUNTER — Other Ambulatory Visit: Payer: Self-pay

## 2020-09-26 ENCOUNTER — Other Ambulatory Visit: Payer: Medicaid Other

## 2020-09-26 DIAGNOSIS — R7303 Prediabetes: Secondary | ICD-10-CM

## 2020-09-27 LAB — HEMOGLOBIN A1C
Est. average glucose Bld gHb Est-mCnc: 126 mg/dL
Hgb A1c MFr Bld: 6 % — ABNORMAL HIGH (ref 4.8–5.6)

## 2020-10-04 ENCOUNTER — Other Ambulatory Visit: Payer: Self-pay

## 2020-10-04 ENCOUNTER — Encounter: Payer: Self-pay | Admitting: Gerontology

## 2020-10-04 ENCOUNTER — Ambulatory Visit: Payer: Medicaid Other | Admitting: Gerontology

## 2020-10-04 VITALS — BP 121/83 | HR 95 | Temp 97.2°F | Resp 16 | Wt 193.2 lb

## 2020-10-04 DIAGNOSIS — M25512 Pain in left shoulder: Secondary | ICD-10-CM | POA: Insufficient documentation

## 2020-10-04 DIAGNOSIS — I10 Essential (primary) hypertension: Secondary | ICD-10-CM

## 2020-10-04 DIAGNOSIS — Z8639 Personal history of other endocrine, nutritional and metabolic disease: Secondary | ICD-10-CM

## 2020-10-04 DIAGNOSIS — E039 Hypothyroidism, unspecified: Secondary | ICD-10-CM

## 2020-10-04 DIAGNOSIS — R7303 Prediabetes: Secondary | ICD-10-CM

## 2020-10-04 DIAGNOSIS — G8929 Other chronic pain: Secondary | ICD-10-CM

## 2020-10-04 DIAGNOSIS — G250 Essential tremor: Secondary | ICD-10-CM

## 2020-10-04 MED ORDER — LEVOTHYROXINE SODIUM 75 MCG PO TABS: ORAL_TABLET | ORAL | 0 refills | Status: DC

## 2020-10-04 MED ORDER — METFORMIN HCL 1000 MG PO TABS: ORAL_TABLET | ORAL | 1 refills | Status: DC

## 2020-10-04 MED ORDER — PRIMIDONE 50 MG PO TABS
ORAL_TABLET | ORAL | 3 refills | Status: DC
Start: 2020-10-04 — End: 2020-10-04

## 2020-10-04 MED ORDER — PRIMIDONE 50 MG PO TABS
ORAL_TABLET | ORAL | 3 refills | Status: DC
Start: 2020-10-04 — End: 2021-05-02

## 2020-10-04 MED ORDER — ATORVASTATIN CALCIUM 40 MG PO TABS
40.0000 mg | ORAL_TABLET | Freq: Every day | ORAL | 1 refills | Status: DC
Start: 2020-10-04 — End: 2021-01-02

## 2020-10-04 MED ORDER — METFORMIN HCL 1000 MG PO TABS
ORAL_TABLET | ORAL | 1 refills | Status: DC
Start: 2020-10-04 — End: 2020-10-04

## 2020-10-04 MED ORDER — ATORVASTATIN CALCIUM 40 MG PO TABS: 40.0000 mg | ORAL_TABLET | Freq: Every day | ORAL | 1 refills | Status: DC

## 2020-10-04 MED ORDER — LISINOPRIL 5 MG PO TABS: 5.0000 mg | ORAL_TABLET | Freq: Every day | ORAL | 1 refills | Status: DC

## 2020-10-04 MED ORDER — LEVOTHYROXINE SODIUM 75 MCG PO TABS
ORAL_TABLET | ORAL | 0 refills | Status: DC
Start: 2020-10-04 — End: 2020-10-04

## 2020-10-04 NOTE — Progress Notes (Signed)
Established Patient Office Visit  Subjective:  Patient ID: Rebekah Salinas, female    DOB: 1956/07/17  Age: 65 y.o. MRN: [id number]  CC: No chief complaint on file.   HPI [name] presents for follow up of prediabetes , lab review and medication refill. Her HgbA1c done on 09/26/2020, decreased from 6.3% to 6%. She is compliant with her medications and continues to make healthy dietary choices. She has a history of chronic left shoulder pain that started more than 5 years ago. Currently, she states that she continues to experience constant non radiating sharp 7 /10 pain to her left shoulder. She states that activities such as house keeping aggravates pain, and taking tylenol minimally relieves symptoms. She also states that her left shoulder is bordering her and requests an intervention to be done. She denies muscle weakness and numbness. She also has a history of head tremor, takes Primidone and last saw a Neurologist 4 years ago. Overall, she states that she's doing well and offers no further complaint.    Past Medical History:  Diagnosis Date  . Diabetes mellitus without complication ([place])   . Hypertension   . Thyroid disease     Past Surgical History:  Procedure Laterality Date  . ABDOMINAL HYSTERECTOMY    . BREAST BIOPSY Left    neg  . BREAST BIOPSY Left    core bx- neg  . BREAST BIOPSY Left 06/13/2019   [place] bx 2:00 coil   . TUBAL LIGATION      Family History  Problem Relation Age of Onset  . CAD Sister   . Breast cancer Sister 37       twin sister  . Cancer Sister   . Cancer Maternal Grandmother     Social History   Socioeconomic History  . Marital status: Married    Spouse name: Not on file  . Number of children: Not on file  . Years of education: Not on file  . Highest education level: Not on file  Occupational History  . Not on file  Tobacco Use  . Smoking status: Never Smoker  . Smokeless tobacco: Never Used  Vaping Use  . Vaping Use: Never used   Substance and Sexual Activity  . Alcohol use: Never  . Drug use: Never  . Sexual activity: Yes    Birth control/protection: None  Other Topics Concern  . Not on file  Social History Narrative  . Not on file   Social Determinants of Health   Financial Resource Strain: Not on file  Food Insecurity: Not on file  Transportation Needs: Not on file  Physical Activity: Not on file  Stress: Not on file  Social Connections: Not on file  Intimate Partner Violence: Not on file    Outpatient Medications Prior to Visit  Medication Sig Dispense Refill  . acetaminophen (TYLENOL) 325 MG tablet Take 650 mg by mouth every 6 (six) hours as needed.    Marland Kitchen ascorbic acid (VITAMIN C) 500 MG tablet Take 1 tablet (500 mg total) by mouth daily. 30 tablet 0  . cholecalciferol (VITAMIN D3) 25 MCG (1000 UNIT) tablet Take 1,000 Units by mouth daily.    . cyclobenzaprine (FLEXERIL) 10 MG tablet TAKE ONE TABLET BY MOUTH 2 TIMES A DAY 60 tablet 0  . omeprazole (PRILOSEC) 20 MG capsule TAKE ONE CAPSULE BY MOUTH EVERY DAY 90 capsule 0  . zinc sulfate 220 (50 Zn) MG capsule Take 1 capsule (220 mg total) by mouth daily. 30 capsule 0  .  atorvastatin (LIPITOR) 40 MG tablet Take 1 tablet (40 mg total) by mouth daily. 90 tablet 1  . levothyroxine (SYNTHROID) 75 MCG tablet TAKE ONE TABLET BY MOUTH EVERY DAY BEFORE BREAKFAST. 90 tablet 1  . lisinopril (ZESTRIL) 5 MG tablet TAKE ONE TABLET BY MOUTH EVERY DAY 90 tablet 0  . metFORMIN (GLUCOPHAGE) 1000 MG tablet TAKE One TABLET (1000MG) BY MOUTH EVERY DAY WITH BREAKFAST. 90 tablet 1  . primidone (MYSOLINE) 50 MG tablet TAKE ONE TABLET BY MOUTH 2 TIMES A DAY 60 tablet 0   No facility-administered medications prior to visit.    Allergies  Allergen Reactions  . Aspirin Nausea And Vomiting    ROS Review of Systems  Constitutional: Negative.   Eyes: Negative.   Respiratory: Negative.   Cardiovascular: Negative.   Endocrine: Negative.   Musculoskeletal: Positive for  arthralgias (chronic left shoulder pain).  Neurological: Positive for tremors (Chronic head tremor).  Psychiatric/Behavioral: Negative.       Objective:    Physical Exam HENT:     Head: Normocephalic and atraumatic.  Cardiovascular:     Rate and Rhythm: Normal rate and regular rhythm.     Pulses: Normal pulses.     Heart sounds: Normal heart sounds.  Pulmonary:     Effort: Pulmonary effort is normal.     Breath sounds: Normal breath sounds.  Musculoskeletal:        General: Tenderness (with palpation of left shoulder) present.  Neurological:     General: No focal deficit present.     Mental Status: She is alert and oriented to person, place, and time. Mental status is at baseline.     Comments: Mild head tremor  Psychiatric:        Mood and Affect: Mood normal.        Thought Content: Thought content normal.        Judgment: Judgment normal.     BP 121/83 (BP Location: Left Arm, Patient Position: Sitting, Cuff Size: Large)   Pulse 95   Temp (!) 97.2 F (36.2 C)   Resp 16   Wt 193 lb 3.2 oz (87.6 kg)   SpO2 97%   BMI 32.15 kg/m  Wt Readings from Last 3 Encounters:  10/04/20 193 lb 3.2 oz (87.6 kg)  09/26/20 197 lb (89.4 kg)  02/01/20 203 lb 1.6 oz (92.1 kg)    She lost 4 pounds in one week, she was encouraged to continue her weight loss regimen  Health Maintenance Due  Topic Date Due  . Hepatitis C Screening  Never done  . TETANUS/TDAP  Never done  . PAP SMEAR-Modifier  Never done  . COLONOSCOPY (Pts 45-78yr Insurance coverage will need to be confirmed)  Never done  . INFLUENZA VACCINE  04/22/2020  . COVID-19 Vaccine (3 - Booster for Pfizer series) 06/30/2020    There are no preventive care reminders to display for this patient.  Lab Results  Component Value Date   TSH 0.717 01/25/2020   Lab Results  Component Value Date   WBC 5.1 12/07/2019   HGB 12.5 12/07/2019   HCT 38.3 12/07/2019   MCV 86 12/07/2019   PLT 418 12/07/2019   Lab Results   Component Value Date   NA 142 12/07/2019   K 4.4 12/07/2019   CO2 27 12/07/2019   GLUCOSE 103 (H) 12/07/2019   BUN 8 12/07/2019   CREATININE 0.97 12/07/2019   BILITOT 0.4 12/07/2019   ALKPHOS 88 12/07/2019   AST 14 12/07/2019  ALT 12 12/07/2019   PROT 7.0 12/07/2019   ALBUMIN 4.3 12/07/2019   CALCIUM 9.7 12/07/2019   ANIONGAP 9 10/13/2019   Lab Results  Component Value Date   CHOL 135 01/25/2020   Lab Results  Component Value Date   HDL 46 01/25/2020   Lab Results  Component Value Date   LDLCALC 72 01/25/2020   Lab Results  Component Value Date   TRIG 87 01/25/2020   Lab Results  Component Value Date   CHOLHDL 2.9 01/25/2020   Lab Results  Component Value Date   HGBA1C 6.0 (H) 09/26/2020      Assessment & Plan:   1. History of elevated lipids -She will continue on current treatment regimen, low fat/low cholesterol diet. - atorvastatin (LIPITOR) 40 MG tablet; Take 1 tablet (40 mg total) by mouth daily.  Dispense: 90 tablet; Refill: 1  2. Hypothyroidism, unspecified type - She will continue on current medication and will recheck TSH is 3 months. - TSH; Future - levothyroxine (SYNTHROID) 75 MCG tablet; TAKE ONE TABLET BY MOUTH EVERY DAY BEFORE BREAKFAST.  Dispense: 90 tablet; Refill: 0  3. Essential hypertension - Her blood pressure is under control and she will continue on current medication  And DASH diet. - Comp Met (CMET); Future - lisinopril (ZESTRIL) 5 MG tablet; Take 1 tablet (5 mg total) by mouth daily.  Dispense: 90 tablet; Refill: 1  4. Prediabetes - Her HgbA1c is 6%, she will continue on medication, low carb/non concentrated sweet diet and exercises as tolerated. - HgB A1c; Future - metFORMIN (GLUCOPHAGE) 1000 MG tablet; TAKE One TABLET (1000MG) BY MOUTH EVERY DAY WITH BREAKFAST.  Dispense: 90 tablet; Refill: 1  5. Benign head tremor -She will continue on current medication, decline Neurology referral until she gets a reliable  transportation. - primidone (MYSOLINE) 50 MG tablet; Take 1 tablet 50 mg twice daily  Dispense: 60 tablet; Refill: 3  6. Chronic pain in left shoulder - She was advised to continue taking Tylenol as needed, and will follow up with [facility] Orthopedic Surgeon Dr Vickki Hearing.     Follow-up: Return in about 3 months (around 01/02/2021), or if symptoms worsen or fail to improve.    Stephaniemarie Stoffel Jerold Coombe, NP

## 2020-10-23 ENCOUNTER — Other Ambulatory Visit: Payer: Self-pay

## 2020-10-23 ENCOUNTER — Ambulatory Visit: Payer: Medicaid Other | Admitting: Specialist

## 2020-10-23 DIAGNOSIS — M25512 Pain in left shoulder: Secondary | ICD-10-CM

## 2020-10-23 DIAGNOSIS — G8929 Other chronic pain: Secondary | ICD-10-CM

## 2020-10-23 NOTE — Progress Notes (Signed)
  Subjective:     Patient ID: Rebekah Salinas, female   DOB: [birth year], 65 y.o.   MRN: [id number]  HPI 65 year old, with a 64yr history of left shoulder pain. She is LHD. She has problem sleeping on her left side. She's always fasten her bra in front and turned it. She says the pain radiate to the lateral side of her neck and down her arms to involve all five fingers. She was once referred to pain clinic, and it was unclear what treatment she received.  Review of Systems     Objective:   Physical Exam  FROM right shoulder for range of motion. On the left FF 130 degrees. EXT 30 degrees, ADD 10 degrees, ABD 130 degrees, IR 90 degrees, ER 90 degrees. She is generally TTP about the shoulder. MMT 4+/5.   She has a good ROM of her neck. DTR's 2+ and =. SENS. Normal.    Assessment:     Possible Pinched nerve.    Plan:        X ray to left shoulder, 2 views of the cervical spine F/U in 2 weeks

## 2020-10-26 ENCOUNTER — Ambulatory Visit
Admission: RE | Admit: 2020-10-26 | Discharge: 2020-10-26 | Disposition: A | Discharge status: Home / Self Care | Payer: Self-pay | Source: Ambulatory Visit | Attending: Gerontology | Admitting: Gerontology

## 2020-10-26 ENCOUNTER — Ambulatory Visit
Admission: RE | Admit: 2020-10-26 | Discharge: 2020-10-26 | Disposition: A | Discharge status: Home / Self Care | Payer: Self-pay | Attending: Specialist | Admitting: Specialist

## 2020-10-26 ENCOUNTER — Other Ambulatory Visit: Payer: Self-pay

## 2020-10-26 DIAGNOSIS — G8929 Other chronic pain: Secondary | ICD-10-CM | POA: Insufficient documentation

## 2020-10-26 DIAGNOSIS — M25512 Pain in left shoulder: Secondary | ICD-10-CM | POA: Insufficient documentation

## 2020-11-13 ENCOUNTER — Ambulatory Visit: Payer: Medicaid Other | Admitting: Specialist

## 2020-11-13 ENCOUNTER — Other Ambulatory Visit: Payer: Self-pay

## 2020-11-13 DIAGNOSIS — G8929 Other chronic pain: Secondary | ICD-10-CM

## 2020-11-13 NOTE — Progress Notes (Signed)
HPI Sx about the same.   Xrays: shoulder neg. C/s multilevel DDD.   PHYSICAL ASSESSMENT Still TTP about the lateral aspect of the shoulder. Her ROM remains the same. Her MMT is 4+/5.   DTR's are 1+ but symmetrical. MMT of UE's is 5/5. A good effort is being given.   PLAN MRI of shoulder Impression R/O rotator cuff disorder. Send for MRI of left shoulder. RTC when study has been obtained.

## 2020-11-22 ENCOUNTER — Telehealth: Payer: Self-pay | Admitting: Pharmacist

## 2020-11-22 NOTE — Telephone Encounter (Signed)
Provided 2022 proof of income. Approved to receive medication assistance at [facility] until time for re-certification in 5364, and as long as eligibility criteria continues to be met.   [place]

## 2020-11-24 ENCOUNTER — Ambulatory Visit
Admission: RE | Admit: 2020-11-24 | Discharge: 2020-11-24 | Disposition: A | Discharge status: Home / Self Care | Payer: Self-pay | Source: Ambulatory Visit | Attending: Gerontology | Admitting: Gerontology

## 2020-11-24 ENCOUNTER — Other Ambulatory Visit: Payer: Self-pay

## 2020-11-24 DIAGNOSIS — M25512 Pain in left shoulder: Secondary | ICD-10-CM | POA: Insufficient documentation

## 2020-11-24 DIAGNOSIS — G8929 Other chronic pain: Secondary | ICD-10-CM | POA: Insufficient documentation

## 2020-12-04 ENCOUNTER — Telehealth: Payer: Self-pay

## 2020-12-04 NOTE — Telephone Encounter (Signed)
Patient is requesting to see a specialist for shoulder pain had a recent MRI.  Patient was scheduled an appointment for 12/25/20. Left [employer] an In Basket message to call patient with update on [facility] application.

## 2020-12-24 ENCOUNTER — Other Ambulatory Visit: Payer: Self-pay

## 2020-12-25 ENCOUNTER — Ambulatory Visit: Payer: Medicaid Other | Admitting: Specialist

## 2020-12-25 ENCOUNTER — Other Ambulatory Visit: Payer: Self-pay

## 2020-12-25 DIAGNOSIS — M25512 Pain in left shoulder: Secondary | ICD-10-CM

## 2020-12-25 DIAGNOSIS — G8929 Other chronic pain: Secondary | ICD-10-CM

## 2020-12-25 NOTE — Progress Notes (Unsigned)
HPI Review of MRI shows no surgical leasion at this time but I think it is time to consider a referral to the prthopedic clinic for the next level of care. She is telling us there are reasons why she cannot have a steroid injection but she needs to discuss that with the next level of provider.   PHYSICAL ASSESSMENT Deferred. Tendinosis left shoulder,   PLAN I am also sending her to physical therapy for HEP.

## 2020-12-26 ENCOUNTER — Other Ambulatory Visit: Payer: Medicaid Other

## 2020-12-26 DIAGNOSIS — R7303 Prediabetes: Secondary | ICD-10-CM

## 2020-12-26 DIAGNOSIS — E039 Hypothyroidism, unspecified: Secondary | ICD-10-CM

## 2020-12-26 DIAGNOSIS — I10 Essential (primary) hypertension: Secondary | ICD-10-CM

## 2020-12-27 LAB — COMPREHENSIVE METABOLIC PANEL
ALT: 17 IU/L (ref 0–32)
AST: 21 IU/L (ref 0–40)
Albumin/Globulin Ratio: 1.3 (ref 1.2–2.2)
Albumin: 3.9 g/dL (ref 3.8–4.8)
Alkaline Phosphatase: 101 IU/L (ref 44–121)
BUN/Creatinine Ratio: 15 (ref 12–28)
BUN: 15 mg/dL (ref 8–27)
Bilirubin Total: 0.4 mg/dL (ref 0.0–1.2)
CO2: 23 mmol/L (ref 20–29)
Calcium: 9 mg/dL (ref 8.7–10.3)
Chloride: 102 mmol/L (ref 96–106)
Creatinine, Ser: 1.03 mg/dL — ABNORMAL HIGH (ref 0.57–1.00)
Globulin, Total: 3 g/dL (ref 1.5–4.5)
Glucose: 92 mg/dL (ref 65–99)
Potassium: 4.5 mmol/L (ref 3.5–5.2)
Sodium: 142 mmol/L (ref 134–144)
Total Protein: 6.9 g/dL (ref 6.0–8.5)
eGFR: 61 mL/min/{1.73_m2} (ref >59)

## 2020-12-27 LAB — TSH: TSH: 1.15 u[IU]/mL (ref 0.450–4.500)

## 2020-12-27 LAB — HEMOGLOBIN A1C
Est. average glucose Bld gHb Est-mCnc: 123 mg/dL
Hgb A1c MFr Bld: 5.9 % — ABNORMAL HIGH (ref 4.8–5.6)

## 2021-01-02 ENCOUNTER — Other Ambulatory Visit: Payer: Self-pay

## 2021-01-02 ENCOUNTER — Ambulatory Visit: Payer: Medicaid Other | Admitting: Gerontology

## 2021-01-02 ENCOUNTER — Encounter: Payer: Self-pay | Admitting: Gerontology

## 2021-01-02 VITALS — BP 128/81 | HR 75 | Temp 98.8°F | Resp 16 | Ht 65.0 in | Wt 204.6 lb

## 2021-01-02 DIAGNOSIS — R7303 Prediabetes: Secondary | ICD-10-CM

## 2021-01-02 DIAGNOSIS — Z8639 Personal history of other endocrine, nutritional and metabolic disease: Secondary | ICD-10-CM

## 2021-01-02 DIAGNOSIS — I10 Essential (primary) hypertension: Secondary | ICD-10-CM

## 2021-01-02 DIAGNOSIS — R7989 Other specified abnormal findings of blood chemistry: Secondary | ICD-10-CM

## 2021-01-02 DIAGNOSIS — Z0000 Encounter for general adult medical examination without abnormal findings: Secondary | ICD-10-CM

## 2021-01-02 DIAGNOSIS — E039 Hypothyroidism, unspecified: Secondary | ICD-10-CM

## 2021-01-02 MED ORDER — LISINOPRIL 5 MG PO TABS
5.0000 mg | ORAL_TABLET | Freq: Every day | ORAL | 2 refills | Status: DC
  Filled 2021-01-02: qty 30, 30d supply, fill #0

## 2021-01-02 MED ORDER — LEVOTHYROXINE SODIUM 75 MCG PO TABS
ORAL_TABLET | ORAL | 2 refills | Status: DC
  Filled 2021-01-02: qty 30, 30d supply, fill #0

## 2021-01-02 MED ORDER — ATORVASTATIN CALCIUM 40 MG PO TABS
40.0000 mg | ORAL_TABLET | Freq: Every day | ORAL | 2 refills | Status: DC
  Filled 2021-01-02: qty 90, 90d supply, fill #0

## 2021-01-02 MED ORDER — METFORMIN HCL 1000 MG PO TABS
ORAL_TABLET | ORAL | 2 refills | Status: DC
  Filled 2021-01-02: qty 30, 30d supply, fill #0

## 2021-01-02 NOTE — Patient Instructions (Signed)
Dehydration, Adult Dehydration is condition in which there is not enough water or other fluids in the body. This happens when a person loses more fluids than he or she takes in. Important body parts cannot work right without the right amount of fluids. Any loss of fluids from the body can cause dehydration. Dehydration can be mild, worse, or very bad. It should be treated right away to keep it from getting very bad. What are the causes? This condition may be caused by:  Conditions that cause loss of water or other fluids, such as: ? Watery poop (diarrhea). ? Vomiting. ? Sweating a lot. ? Peeing (urinating) a lot.  Not drinking enough fluids, especially when you: ? Are ill. ? Are doing things that take a lot of energy to do.  Other illnesses and conditions, such as fever or infection.  Certain medicines, such as medicines that take extra fluid out of the body (diuretics).  Lack of safe drinking water.  Not being able to get enough water and food. What increases the risk? The following factors may make you more likely to develop this condition:  Having a long-term (chronic) illness that has not been treated the right way, such as: ? Diabetes. ? Heart disease. ? Kidney disease.  Being 65 years of age or older.  Having a disability.  Living in a place that is high above the ground or sea (high in altitude). The thinner, dried air causes more fluid loss.  Doing exercises that put stress on your body for a long time. What are the signs or symptoms? Symptoms of dehydration depend on how bad it is. Mild or worse dehydration  Thirst.  Dry lips or dry mouth.  Feeling dizzy or light-headed, especially when you stand up from sitting.  Muscle cramps.  Your body making: ? Dark pee (urine). Pee may be the color of tea. ? Less pee than normal. ? Less tears than normal.  Headache. Very bad dehydration  Changes in skin. Skin may: ? Be cold to the touch (clammy). ? Be blotchy  or pale. ? Not go back to normal right after you lightly pinch it and let it go.  Little or no tears, pee, or sweat.  Changes in vital signs, such as: ? Fast breathing. ? Low blood pressure. ? Weak pulse. ? Pulse that is more than 100 beats a minute when you are sitting still.  Other changes, such as: ? Feeling very thirsty. ? Eyes that look hollow (sunken). ? Cold hands and feet. ? Being mixed up (confused). ? Being very tired (lethargic) or having trouble waking from sleep. ? Short-term weight loss. ? Loss of consciousness. How is this treated? Treatment for this condition depends on how bad it is. Treatment should start right away. Do not wait until your condition gets very bad. Very bad dehydration is an emergency. You will need to go to a hospital.  Mild or worse dehydration can be treated at home. You may be asked to: ? Drink more fluids. ? Drink an oral rehydration solution (ORS). This drink helps get the right amounts of fluids and salts and minerals in the blood (electrolytes).  Very bad dehydration can be treated: ? With fluids through an IV tube. ? By getting normal levels of salts and minerals in your blood. This is often done by giving salts and minerals through a tube. The tube is passed through your nose and into your stomach. ? By treating the root cause. Follow these instructions at   home: Oral rehydration solution If told by your doctor, drink an ORS:  Make an ORS. Use instructions on the package.  Start by drinking small amounts, about  cup (120 mL) every 5-10 minutes.  Slowly drink more until you have had the amount that your doctor said to have. Eating and drinking  Drink enough clear fluid to keep your pee pale yellow. If you were told to drink an ORS, finish the ORS first. Then, start slowly drinking other clear fluids. Drink fluids such as: ? Water. Do not drink only water. Doing that can make the salt (sodium) level in your body get too low. ? Water  from ice chips you suck on. ? Fruit juice that you have added water to (diluted). ? Low-calorie sports drinks.  Eat foods that have the right amounts of salts and minerals, such as: ? Bananas. ? Oranges. ? Potatoes. ? Tomatoes. ? Spinach.  Do not drink alcohol.  Avoid: ? Drinks that have a lot of sugar. These include:  High-calorie sports drinks.  Fruit juice that you did not add water to.  Soda.  Caffeine. ? Foods that are greasy or have a lot of fat or sugar.         General instructions  Take over-the-counter and prescription medicines only as told by your doctor.  Do not take salt tablets. Doing that can make the salt level in your body get too high.  Return to your normal activities as told by your doctor. Ask your doctor what activities are safe for you.  Keep all follow-up visits as told by your doctor. This is important. Contact a doctor if:  You have pain in your belly (abdomen) and the pain: ? Gets worse. ? Stays in one place.  You have a rash.  You have a stiff neck.  You get angry or annoyed (irritable) more easily than normal.  You are more tired or have a harder time waking than normal.  You feel: ? Weak or dizzy. ? Very thirsty. Get help right away if you have:  Any symptoms of very bad dehydration.  Symptoms of vomiting, such as: ? You cannot eat or drink without vomiting. ? Your vomiting gets worse or does not go away. ? Your vomit has blood or green stuff in it.  Symptoms that get worse with treatment.  A fever.  A very bad headache.  Problems with peeing or pooping (having a bowel movement), such as: ? Watery poop that gets worse or does not go away. ? Blood in your poop (stool). This may cause poop to look black and tarry. ? Not peeing in 6-8 hours. ? Peeing only a small amount of very dark pee in 6-8 hours.  Trouble breathing. These symptoms may be an emergency. Do not wait to see if the symptoms will go away. Get  medical help right away. Call your local emergency services (911 in the [place] Do not drive yourself to the hospital. Summary  Dehydration is a condition in which there is not enough water or other fluids in the body. This happens when a person loses more fluids than he or she takes in.  Treatment for this condition depends on how bad it is. Treatment should be started right away. Do not wait until your condition gets very bad.  Drink enough clear fluid to keep your pee pale yellow. If you were told to drink an oral rehydration solution (ORS), finish the ORS first. Then, start slowly drinking other clear fluids.    Take over-the-counter and prescription medicines only as told by your doctor.  Get help right away if you have any symptoms of very bad dehydration. This information is not intended to replace advice given to you by your health care provider. Make sure you discuss any questions you have with your health care provider. Document Revised: 04/21/2019 Document Reviewed: 04/21/2019 Elsevier Patient Education  2021 Elsevier Inc. https://www.mata.[email or web address]">  DASH Eating Plan DASH stands for Dietary Approaches to Stop Hypertension. The DASH eating plan is a healthy eating plan that has been shown to:  Reduce high blood pressure (hypertension).  Reduce your risk for type 2 diabetes, heart disease, and stroke.  Help with weight loss. What are tips for following this plan? Reading food labels  Check food labels for the amount of salt (sodium) per serving. Choose foods with less than 5 percent of the Daily Value of sodium. Generally, foods with less than 300 milligrams (mg) of sodium per serving fit into this eating plan.  To find whole grains, look for the word "whole" as the first word in the ingredient list. Shopping  Buy products labeled as "low-sodium" or "no salt added."  Buy fresh foods. Avoid canned foods and pre-made or frozen  meals. Cooking  Avoid adding salt when cooking. Use salt-free seasonings or herbs instead of table salt or sea salt. Check with your health care provider or pharmacist before using salt substitutes.  Do not fry foods. Cook foods using healthy methods such as baking, boiling, grilling, roasting, and broiling instead.  Cook with heart-healthy oils, such as olive, canola, avocado, soybean, or sunflower oil. Meal planning  Eat a balanced diet that includes: ? 4 or more servings of fruits and 4 or more servings of vegetables each day. Try to fill one-half of your plate with fruits and vegetables. ? 6-8 servings of whole grains each day. ? Less than 6 oz (170 g) of lean meat, poultry, or fish each day. A 3-oz (85-g) serving of meat is about the same size as a deck of cards. One egg equals 1 oz (28 g). ? 2-3 servings of low-fat dairy each day. One serving is 1 cup (237 mL). ? 1 serving of nuts, seeds, or beans 5 times each week. ? 2-3 servings of heart-healthy fats. Healthy fats called omega-3 fatty acids are found in foods such as walnuts, flaxseeds, fortified milks, and eggs. These fats are also found in cold-water fish, such as sardines, salmon, and mackerel.  Limit how much you eat of: ? Canned or prepackaged foods. ? Food that is high in trans fat, such as some fried foods. ? Food that is high in saturated fat, such as fatty meat. ? Desserts and other sweets, sugary drinks, and other foods with added sugar. ? Full-fat dairy products.  Do not salt foods before eating.  Do not eat more than 4 egg yolks a week.  Try to eat at least 2 vegetarian meals a week.  Eat more home-cooked food and less restaurant, buffet, and fast food.   Lifestyle  When eating at a restaurant, ask that your food be prepared with less salt or no salt, if possible.  If you drink alcohol: ? Limit how much you use to:  0-1 drink a day for women who are not pregnant.  0-2 drinks a day for men. ? Be aware of  how much alcohol is in your drink. In the [place] one drink equals one 12 oz bottle of beer (355 mL), one 5 oz glass  of wine (148 mL), or one 1 oz glass of hard liquor (44 mL). General information  Avoid eating more than 2,300 mg of salt a day. If you have hypertension, you may need to reduce your sodium intake to 1,500 mg a day.  Work with your health care provider to maintain a healthy body weight or to lose weight. Ask what an ideal weight is for you.  Get at least 30 minutes of exercise that causes your heart to beat faster (aerobic exercise) most days of the week. Activities may include walking, swimming, or biking.  Work with your health care provider or dietitian to adjust your eating plan to your individual calorie needs. What foods should I eat? Fruits All fresh, dried, or frozen fruit. Canned fruit in natural juice (without added sugar). Vegetables Fresh or frozen vegetables (raw, steamed, roasted, or grilled). Low-sodium or reduced-sodium tomato and vegetable juice. Low-sodium or reduced-sodium tomato sauce and tomato paste. Low-sodium or reduced-sodium canned vegetables. Grains Whole-grain or whole-wheat bread. Whole-grain or whole-wheat pasta. Brown rice. Rebekah Salinas. Bulgur. Whole-grain and low-sodium cereals. Pita bread. Low-fat, low-sodium crackers. Whole-wheat flour tortillas. Meats and other proteins Skinless chicken or [place]. Ground chicken or [place]. Pork with fat trimmed off. Fish and seafood. Egg whites. Dried beans, peas, or lentils. Unsalted nuts, nut butters, and seeds. Unsalted canned beans. Lean cuts of beef with fat trimmed off. Low-sodium, lean precooked or cured meat, such as sausages or meat loaves. Dairy Low-fat (1%) or fat-free (skim) milk. Reduced-fat, low-fat, or fat-free cheeses. Nonfat, low-sodium ricotta or cottage cheese. Low-fat or nonfat yogurt. Low-fat, low-sodium cheese. Fats and oils Soft margarine without trans fats. Vegetable oil. Reduced-fat,  low-fat, or light mayonnaise and salad dressings (reduced-sodium). Canola, safflower, olive, avocado, soybean, and sunflower oils. Avocado. Seasonings and condiments Herbs. Spices. Seasoning mixes without salt. Other foods Unsalted popcorn and pretzels. Fat-free sweets. The items listed above may not be a complete list of foods and beverages you can eat. Contact a dietitian for more information. What foods should I avoid? Fruits Canned fruit in a light or heavy syrup. Fried fruit. Fruit in cream or butter sauce. Vegetables Creamed or fried vegetables. Vegetables in a cheese sauce. Regular canned vegetables (not low-sodium or reduced-sodium). Regular canned tomato sauce and paste (not low-sodium or reduced-sodium). Regular tomato and vegetable juice (not low-sodium or reduced-sodium). Rebekah Salinas. Olives. Grains Baked goods made with fat, such as croissants, muffins, or some breads. Dry pasta or rice meal packs. Meats and other proteins Fatty cuts of meat. Ribs. Fried meat. Rebekah Salinas. Bologna, salami, and other precooked or cured meats, such as sausages or meat loaves. Fat from the back of a pig (fatback). Bratwurst. Salted nuts and seeds. Canned beans with added salt. Canned or smoked fish. Whole eggs or egg yolks. Chicken or [place] with skin. Dairy Whole or 2% milk, cream, and half-and-half. Whole or full-fat cream cheese. Whole-fat or sweetened yogurt. Full-fat cheese. Nondairy creamers. Whipped toppings. Processed cheese and cheese spreads. Fats and oils Butter. Stick margarine. Lard. Shortening. Ghee. Bacon fat. Tropical oils, such as coconut, palm kernel, or palm oil. Seasonings and condiments Onion salt, garlic salt, seasoned salt, table salt, and sea salt. Worcestershire sauce. Tartar sauce. Barbecue sauce. Teriyaki sauce. Soy sauce, including reduced-sodium. Steak sauce. Canned and packaged gravies. Fish sauce. Oyster sauce. Cocktail sauce. Store-bought horseradish. Ketchup. Mustard. Meat  flavorings and tenderizers. Bouillon cubes. Hot sauces. Pre-made or packaged marinades. Pre-made or packaged taco seasonings. Relishes. Regular salad dressings. Other foods Salted popcorn and pretzels. The  items listed above may not be a complete list of foods and beverages you should avoid. Contact a dietitian for more information. Where to find more information  National Heart, Lung, and Blood Institute: PopSteam.is  American Heart Association: www.heart.org  Academy of Nutrition and Dietetics: www.eatright.org  National Kidney Foundation: www.kidney.org Summary  The DASH eating plan is a healthy eating plan that has been shown to reduce high blood pressure (hypertension). It may also reduce your risk for type 2 diabetes, heart disease, and stroke.  When on the DASH eating plan, aim to eat more fresh fruits and vegetables, whole grains, lean proteins, low-fat dairy, and heart-healthy fats.  With the DASH eating plan, you should limit salt (sodium) intake to 2,300 mg a day. If you have hypertension, you may need to reduce your sodium intake to 1,500 mg a day.  Work with your health care provider or dietitian to adjust your eating plan to your individual calorie needs. This information is not intended to replace advice given to you by your health care provider. Make sure you discuss any questions you have with your health care provider. Document Revised: 08/12/2019 Document Reviewed: 08/12/2019 Elsevier Patient Education  2021 [employer].

## 2021-01-02 NOTE — Progress Notes (Signed)
Established Patient Office Visit  Subjective:  Patient ID: Rebekah Salinas, female    DOB: 10/30/1955  Age: 65 y.o. MRN: [id number]  CC: Follow up for lab review  HPI Rebekah Salinas is a 65 year old female who presents for lab follow up. She is compliant with her medications and continues to make healthy lifestyle choices. Her Hgb A1c decreased from 6% to 5.9%. She avoids sugary beverages and fried/greasy foods and exercises 2x/week. She denies any polyuria, polydipsia or polyphagia. She performs daily self foot exams and denies peripheral neuropathy. Her SBP has been averaging 120's at home. She denies chest pain, palpitations, or swelling in her lower extremities. Her TSH was stable at 1.15 uIU/mL. She denies hot or cold intolerance, anxiety/irritability, weight loss/gain or difficulties sleeping. Her creatinine is mildly elevated at 1.03 mg/dL. She denies any decrease in urine output, flank pain, myalgias, or fatigue. Overall, she feels well and offers no further complaints.   Past Medical History:  Diagnosis Date  . Diabetes mellitus without complication (HCC)   . Hypertension   . Thyroid disease     Past Surgical History:  Procedure Laterality Date  . ABDOMINAL HYSTERECTOMY    . BREAST BIOPSY Left    neg  . BREAST BIOPSY Left    core bx- neg  . BREAST BIOPSY Left 06/13/2019   [place] bx 2:00 coil   . TUBAL LIGATION      Family History  Problem Relation Age of Onset  . CAD Sister   . Breast cancer Sister 74       twin sister  . Cancer Sister   . Cancer Maternal Grandmother     Social History   Socioeconomic History  . Marital status: Married    Spouse name: Not on file  . Number of children: Not on file  . Years of education: Not on file  . Highest education level: Not on file  Occupational History  . Not on file  Tobacco Use  . Smoking status: Never Smoker  . Smokeless tobacco: Never Used  Vaping Use  . Vaping Use: Never used  Substance and Sexual Activity  .  Alcohol use: Never  . Drug use: Never  . Sexual activity: Yes    Birth control/protection: None  Other Topics Concern  . Not on file  Social History Narrative  . Not on file   Social Determinants of Health   Financial Resource Strain: Not on file  Food Insecurity: Not on file  Transportation Needs: Not on file  Physical Activity: Not on file  Stress: Not on file  Social Connections: Not on file  Intimate Partner Violence: Not on file    Outpatient Medications Prior to Visit  Medication Sig Dispense Refill  . acetaminophen (TYLENOL) 325 MG tablet Take 650 mg by mouth every 6 (six) hours as needed.    Marland Kitchen ascorbic acid (VITAMIN C) 500 MG tablet Take 1 tablet (500 mg total) by mouth daily. 30 tablet 0  . cholecalciferol (VITAMIN D3) 25 MCG (1000 UNIT) tablet Take 1,000 Units by mouth daily.    . cyclobenzaprine (FLEXERIL) 10 MG tablet TAKE ONE TABLET BY MOUTH 2 TIMES A DAY 60 tablet 1  . omeprazole (PRILOSEC) 20 MG capsule TAKE ONE CAPSULE BY MOUTH EVERY DAY 90 capsule 1  . primidone (MYSOLINE) 50 MG tablet Take 1 tablet 50 mg twice daily 60 tablet 3  . zinc sulfate 220 (50 Zn) MG capsule Take 1 capsule (220 mg total) by mouth daily. 30  capsule 0  . atorvastatin (LIPITOR) 40 MG tablet Take 1 tablet (40 mg total) by mouth daily. 90 tablet 1  . levothyroxine (SYNTHROID) 75 MCG tablet TAKE ONE TABLET BY MOUTH EVERY DAY BEFORE BREAKFAST. 90 tablet 0  . lisinopril (ZESTRIL) 5 MG tablet Take 1 tablet (5 mg total) by mouth daily. 90 tablet 1  . metFORMIN (GLUCOPHAGE) 1000 MG tablet TAKE One TABLET (1000MG ) BY MOUTH EVERY DAY WITH BREAKFAST. 90 tablet 1   No facility-administered medications prior to visit.    Allergies  Allergen Reactions  . Aspirin Nausea And Vomiting    ROS Review of Systems  Constitutional: Negative.   HENT: Negative.   Respiratory: Negative.   Cardiovascular: Negative.   Gastrointestinal: Negative.   Endocrine: Negative.   Genitourinary: Negative.    Musculoskeletal: Negative.   Skin: Negative.   Neurological: Negative.   Psychiatric/Behavioral: Negative.       Objective:    Physical Exam Constitutional:      Appearance: Normal appearance. She is obese.  HENT:     Nose: Nose normal.     Mouth/Throat:     Mouth: Mucous membranes are moist.     Pharynx: Oropharynx is clear.  Eyes:     Extraocular Movements: Extraocular movements intact.     Conjunctiva/sclera: Conjunctivae normal.     Pupils: Pupils are equal, round, and reactive to light.  Cardiovascular:     Rate and Rhythm: Normal rate and regular rhythm.     Pulses: Normal pulses.     Heart sounds: Normal heart sounds.  Pulmonary:     Effort: Pulmonary effort is normal.     Breath sounds: Normal breath sounds.  Abdominal:     General: Bowel sounds are normal.     Palpations: Abdomen is soft.  Musculoskeletal:     Right lower leg: No edema.     Left lower leg: No edema.  Skin:    General: Skin is warm and dry.     Capillary Refill: Capillary refill takes less than 2 seconds.  Neurological:     Mental Status: She is alert and oriented to person, place, and time. Mental status is at baseline.  Psychiatric:        Mood and Affect: Mood normal.        Behavior: Behavior normal.     BP 128/81 (BP Location: Left Arm, Patient Position: Sitting, Cuff Size: Normal)   Pulse 75   Temp 98.8 F (37.1 C)   Resp 16   Ht 5\' 5"  (1.651 m)   Wt 204 lb 9.6 oz (92.8 kg)   SpO2 96%   BMI 34.05 kg/m  Wt Readings from Last 3 Encounters:  01/02/21 204 lb 9.6 oz (92.8 kg)  12/26/20 204 lb 8 oz (92.8 kg)  10/04/20 193 lb 3.2 oz (87.6 kg)     Health Maintenance Due  Topic Date Due  . Hepatitis C Screening  Never done  . TETANUS/TDAP  Never done  . PAP SMEAR-Modifier  Never done  . COLONOSCOPY (Pts 45-14yrs Insurance coverage will need to be confirmed)  Never done  . COVID-19 Vaccine (3 - Booster for Pfizer series) 06/30/2020    There are no preventive care reminders  to display for this patient.  Lab Results  Component Value Date   TSH 1.150 12/26/2020   Lab Results  Component Value Date   WBC 5.1 12/07/2019   HGB 12.5 12/07/2019   HCT 38.3 12/07/2019   MCV 86 12/07/2019   PLT  418 12/07/2019   Lab Results  Component Value Date   NA 142 12/26/2020   K 4.5 12/26/2020   CO2 23 12/26/2020   GLUCOSE 92 12/26/2020   BUN 15 12/26/2020   CREATININE 1.03 (H) 12/26/2020   BILITOT 0.4 12/26/2020   ALKPHOS 101 12/26/2020   AST 21 12/26/2020   ALT 17 12/26/2020   PROT 6.9 12/26/2020   ALBUMIN 3.9 12/26/2020   CALCIUM 9.0 12/26/2020   ANIONGAP 9 10/13/2019   Lab Results  Component Value Date   CHOL 135 01/25/2020   Lab Results  Component Value Date   HDL 46 01/25/2020   Lab Results  Component Value Date   LDLCALC 72 01/25/2020   Lab Results  Component Value Date   TRIG 87 01/25/2020   Lab Results  Component Value Date   CHOLHDL 2.9 01/25/2020   Lab Results  Component Value Date   HGBA1C 5.9 (H) 12/26/2020      Assessment & Plan:   1. Essential hypertension BP stable and well-controlled. Continue current medication regimen. Continue to monitor BP at home and follow the DASH diet. Goal <140/90. - Basic Metabolic Panel (BMET); Future - Ambulatory referral to Ophthalmology - lisinopril (ZESTRIL) 5 MG tablet; Take 1 tablet (5 mg total) by mouth daily.  Dispense: 30 tablet; Refill: 2  2. Hypothyroidism, unspecified type TSH stable. Will recheck in one year or if symptoms arise. Pt educated on s/s to monitor for and notify of. Continue current medication regimen. - levothyroxine (SYNTHROID) 75 MCG tablet; TAKE ONE TABLET BY MOUTH EVERY DAY BEFORE BREAKFAST.  Dispense: 30 tablet; Refill: 2  3. Prediabetes Hgb A1c stable. Will recheck in 3 months. Continue current medication regimen. Exercise 150 min/week. Avoid sugary beverages and monitor carb intake. Daily self foot exams.  - HgB A1c; Future - Ambulatory referral to  Ophthalmology - metFORMIN (GLUCOPHAGE) 1000 MG tablet; TAKE One TABLET (1000MG ) BY MOUTH EVERY DAY WITH BREAKFAST.  Dispense: 30 tablet; Refill: 2  4. Health care maintenance Patient has difficulties with transportation for colonoscopy. Stool specimen ordered for colorectal cancer screening. Pt educated and understood that if any abnormalities on stool specimen, she will need follow up with colonoscopy.  - Fecal Occult Blood, [place]  5. Elevated serum creatinine No elevated of GFR or BUN. Possibly related to dehydration. Pt encouraged to increase intake of water. Will recheck at next lab draw.  - Basic Metabolic Panel (BMET); Future  6. History of elevated lipids Tolerating pharmacological regimen well. No myalgias or arthralgias. Will continue with current medication regimen.  - atorvastatin (LIPITOR) 40 MG tablet; Take 1 tablet (40 mg total) by mouth daily.  Dispense: 30 tablet; Refill: 2   Follow-up: Return in 3 months (04/03/21), or if symptoms worsen or do not improve.   04/05/21, RN, BSN, FNP-S

## 2021-01-03 ENCOUNTER — Other Ambulatory Visit: Payer: Self-pay

## 2021-01-09 ENCOUNTER — Other Ambulatory Visit: Payer: Self-pay

## 2021-01-09 ENCOUNTER — Ambulatory Visit: Payer: Medicaid Other | Attending: Oncology

## 2021-01-09 ENCOUNTER — Ambulatory Visit
Admission: RE | Admit: 2021-01-09 | Discharge: 2021-01-09 | Disposition: A | Discharge status: Home / Self Care | Payer: Self-pay | Source: Ambulatory Visit | Attending: Oncology | Admitting: Oncology

## 2021-01-09 VITALS — BP 148/77 | HR 96 | Temp 96.7°F | Ht 65.0 in | Wt 207.9 lb

## 2021-01-09 DIAGNOSIS — Z0000 Encounter for general adult medical examination without abnormal findings: Secondary | ICD-10-CM

## 2021-01-09 NOTE — Progress Notes (Signed)
  Subjective:     Patient ID: Rebekah Salinas, female   DOB: Jun 02, 1956, 65 y.o.   MRN: [id number]  HPI   Review of Systems     Objective:   Physical Exam Chest:  Breasts:     Right: No swelling, bleeding, inverted nipple, mass, nipple discharge, skin change or tenderness.     Left: No swelling, bleeding, inverted nipple, mass, nipple discharge, skin change or tenderness.          Assessment:     65 year old patient presents for [facility] clinic visit.  Patient screened, and meets [facility] eligibility.  Patient does not have insurance, Medicare or Medicaid. Instructed patient on breast self awareness using teach back method.  Clinical breast exam unremarkable.  No mass or lump palpated. Risk Assessment    Risk Scores      01/09/2021 12/27/2019   Last edited by: Scarlett Presto, RN [place], Freada Bergeron, CMA   5-year risk: 2.7 % 2.6 %   Lifetime risk: 10.6 % 10.9 %            Plan:     Sent for bilateral screening mammogram.

## 2021-01-10 NOTE — Progress Notes (Signed)
Letter mailed from [facility] to notify of normal mammogram results.  Patient to return in one year for annual screening.  Copy to [facility]. 

## 2021-01-16 ENCOUNTER — Other Ambulatory Visit: Payer: Medicaid Other

## 2021-01-16 ENCOUNTER — Other Ambulatory Visit: Payer: Self-pay

## 2021-01-16 DIAGNOSIS — I10 Essential (primary) hypertension: Secondary | ICD-10-CM

## 2021-01-23 LAB — FECAL OCCULT BLOOD, IMMUNOCHEMICAL

## 2021-02-08 ENCOUNTER — Other Ambulatory Visit: Payer: Self-pay

## 2021-03-27 ENCOUNTER — Other Ambulatory Visit: Payer: Self-pay

## 2021-03-27 ENCOUNTER — Other Ambulatory Visit: Payer: Medicaid Other

## 2021-03-27 DIAGNOSIS — I10 Essential (primary) hypertension: Secondary | ICD-10-CM

## 2021-03-27 DIAGNOSIS — R7989 Other specified abnormal findings of blood chemistry: Secondary | ICD-10-CM

## 2021-03-27 DIAGNOSIS — R7303 Prediabetes: Secondary | ICD-10-CM

## 2021-03-28 LAB — BASIC METABOLIC PANEL
BUN/Creatinine Ratio: 13 (ref 12–28)
BUN: 13 mg/dL (ref 8–27)
CO2: 24 mmol/L (ref 20–29)
Calcium: 9.3 mg/dL (ref 8.7–10.3)
Chloride: 105 mmol/L (ref 96–106)
Creatinine, Ser: 1 mg/dL (ref 0.57–1.00)
Glucose: 101 mg/dL — ABNORMAL HIGH (ref 65–99)
Potassium: 4.5 mmol/L (ref 3.5–5.2)
Sodium: 142 mmol/L (ref 134–144)
eGFR: 63 mL/min/{1.73_m2} (ref >59)

## 2021-03-28 LAB — HEMOGLOBIN A1C
Est. average glucose Bld gHb Est-mCnc: 137 mg/dL
Hgb A1c MFr Bld: 6.4 % — ABNORMAL HIGH (ref 4.8–5.6)

## 2021-04-03 ENCOUNTER — Ambulatory Visit: Payer: Medicaid Other | Admitting: Gerontology

## 2021-04-03 ENCOUNTER — Encounter: Payer: Self-pay | Admitting: Gerontology

## 2021-04-03 ENCOUNTER — Other Ambulatory Visit: Payer: Self-pay

## 2021-04-03 VITALS — BP 124/81 | HR 86 | Temp 96.8°F | Resp 16 | Ht 65.0 in | Wt 215.2 lb

## 2021-04-03 DIAGNOSIS — E039 Hypothyroidism, unspecified: Secondary | ICD-10-CM

## 2021-04-03 DIAGNOSIS — Z8639 Personal history of other endocrine, nutritional and metabolic disease: Secondary | ICD-10-CM

## 2021-04-03 DIAGNOSIS — R7303 Prediabetes: Secondary | ICD-10-CM

## 2021-04-03 DIAGNOSIS — I10 Essential (primary) hypertension: Secondary | ICD-10-CM

## 2021-04-03 MED ORDER — LISINOPRIL 5 MG PO TABS
5.0000 mg | ORAL_TABLET | Freq: Every day | ORAL | 2 refills | Status: DC
  Filled 2021-04-03: qty 30, 30d supply, fill #0
  Filled 2021-04-30: qty 30, 30d supply, fill #1

## 2021-04-03 MED ORDER — LEVOTHYROXINE SODIUM 75 MCG PO TABS
ORAL_TABLET | ORAL | 2 refills | Status: DC
  Filled 2021-04-03: qty 30, 30d supply, fill #0
  Filled 2021-04-30: qty 30, 30d supply, fill #1

## 2021-04-03 MED ORDER — ATORVASTATIN CALCIUM 40 MG PO TABS
40.0000 mg | ORAL_TABLET | Freq: Every day | ORAL | 2 refills | Status: DC
  Filled 2021-04-03 – 2022-02-12 (×4): qty 30, 30d supply, fill #0

## 2021-04-03 MED ORDER — METFORMIN HCL 1000 MG PO TABS
ORAL_TABLET | ORAL | 2 refills | Status: DC
  Filled 2021-04-03: qty 30, 30d supply, fill #0
  Filled 2021-04-30: qty 30, 30d supply, fill #1

## 2021-04-03 NOTE — Patient Instructions (Signed)
https://www.diabeteseducator.org/docs/default-source/living-with-diabetes/conquering-the-grocery-store-v1.pdf?sfvrsn=4">  Carbohydrate Counting for Diabetes Mellitus, Adult Carbohydrate counting is a method of keeping track of how many carbohydrates you eat. Eating carbohydrates naturally increases the amount of sugar (glucose) in the blood. Counting how many carbohydrates you eat improves your bloodglucose control, which helps you manage your diabetes. It is important to know how many carbohydrates you can safely have in each meal. This is different for every person. A dietitian can help you make a meal plan and calculate how many carbohydrates you should have at each meal andsnack. What foods contain carbohydrates? Carbohydrates are found in the following foods: Grains, such as breads and cereals. Dried beans and soy products. Starchy vegetables, such as potatoes, peas, and corn. Fruit and fruit juices. Milk and yogurt. Sweets and snack foods, such as cake, cookies, candy, chips, and soft drinks. How do I count carbohydrates in foods? There are two ways to count carbohydrates in food. You can read food labels or learn standard serving sizes of foods. You can use either of the methods or acombination of both. Using the Nutrition Facts label The Nutrition Facts list is included on the labels of almost all packaged foods and beverages in the [place] It includes: The serving size. Information about nutrients in each serving, including the grams (g) of carbohydrate per serving. To use the Nutrition Facts: Decide how many servings you will have. Multiply the number of servings by the number of carbohydrates per serving. The resulting number is the total amount of carbohydrates that you will be having. Learning the standard serving sizes of foods When you eat carbohydrate foods that are not packaged or do not include Nutrition Facts on the label, you need to measure the servings in order to count the  amount of carbohydrates. Measure the foods that you will eat with a food scale or measuring cup, if needed. Decide how many standard-size servings you will eat. Multiply the number of servings by 15. For foods that contain carbohydrates, one serving equals 15 g of carbohydrates. For example, if you eat 2 cups or 10 oz (300 g) of strawberries, you will have eaten 2 servings and 30 g of carbohydrates (2 servings x 15 g = 30 g). For foods that have more than one food mixed, such as soups and casseroles, you must count the carbohydrates in each food that is included. The following list contains standard serving sizes of common carbohydrate-rich foods. Each of these servings has about 15 g of carbohydrates: 1 slice of bread. 1 six-inch (15 cm) tortilla. ? cup or 2 oz (53 g) cooked rice or pasta.  cup or 3 oz (85 g) cooked or canned, drained and rinsed beans or lentils.  cup or 3 oz (85 g) starchy vegetable, such as peas, corn, or squash.  cup or 4 oz (120 g) hot cereal.  cup or 3 oz (85 g) boiled or mashed potatoes, or  or 3 oz (85 g) of a large baked potato.  cup or 4 fl oz (118 mL) fruit juice. 1 cup or 8 fl oz (237 mL) milk. 1 small or 4 oz (106 g) apple.  or 2 oz (63 g) of a medium banana. 1 cup or 5 oz (150 g) strawberries. 3 cups or 1 oz (24 g) popped popcorn. What is an example of carbohydrate counting? To calculate the number of carbohydrates in this sample meal, follow the [place] below. Sample meal 3 oz (85 g) chicken breast. ? cup or 4 oz (106 g) brown rice.    cup or 3 oz (85 g) corn. 1 cup or 8 fl oz (237 mL) milk. 1 cup or 5 oz (150 g) strawberries with sugar-free whipped topping. Carbohydrate calculation Identify the foods that contain carbohydrates: Rice. Corn. Milk. Strawberries. Calculate how many servings you have of each food: 2 servings rice. 1 serving corn. 1 serving milk. 1 serving strawberries. Multiply each number of servings by 15 g: 2 servings  rice x 15 g = 30 g. 1 serving corn x 15 g = 15 g. 1 serving milk x 15 g = 15 g. 1 serving strawberries x 15 g = 15 g. Add together all of the amounts to find the total grams of carbohydrates eaten: 30 g + 15 g + 15 g + 15 g = 75 g of carbohydrates total. What are tips for following this plan? Shopping Develop a meal plan and then make a shopping list. Buy fresh and frozen vegetables, fresh and frozen fruit, dairy, eggs, beans, lentils, and whole grains. Look at food labels. Choose foods that have more fiber and less sugar. Avoid processed foods and foods with added sugars. Meal planning Aim to have the same amount of carbohydrates at each meal and for each snack time. Plan to have regular, balanced meals and snacks. Where to find more information American Diabetes Association: www.diabetes.org Centers for Disease Control and Prevention: www.cdc.gov Summary Carbohydrate counting is a method of keeping track of how many carbohydrates you eat. Eating carbohydrates naturally increases the amount of sugar (glucose) in the blood. Counting how many carbohydrates you eat improves your blood glucose control, which helps you manage your diabetes. A dietitian can help you make a meal plan and calculate how many carbohydrates you should have at each meal and snack. This information is not intended to replace advice given to you by your health care provider. Make sure you discuss any questions you have with your healthcare provider. Document Revised: 09/08/2019 Document Reviewed: 09/09/2019 Elsevier Patient Education  2021 Elsevier Inc.  

## 2021-04-03 NOTE — Progress Notes (Signed)
Established Patient Office Visit  Subjective:  Patient ID: Rebekah Salinas, female    DOB: 27-Mar-1956  Age: 65 y.o. MRN: [id number]  CC:  Chief Complaint  Patient presents with   Follow-up    Labs done 03/27/21   Hypertension    Patient states she sometimes checks her BP at home. Usually 120s/80s    HPI Rebekah Salinas is a 65 y/o female who has history of Hypertension, Thyroid disease, Voice Tremors, presents for routine follow up and lab review. She states that she's compliant with her medications, checks her blood pressure at home and it's usually in the 120's/80's. Her HgbA1c increased from 5.9% to 6.4%, she states that due to having 1-2 bowel movements every other day, that her hemorrhoid is irritated and it bleeds. She states that she has nobody to accompany her to have Colonoscopy done. She denies chest pain, palpitation, dizziness, peripheral neuropathy, and cold/heat intolerance. Overall, she states that she's doing well and offers no further complaint.     Past Medical History:  Diagnosis Date   Diabetes mellitus without complication ([place])    Hypertension    Thyroid disease     Past Surgical History:  Procedure Laterality Date   ABDOMINAL HYSTERECTOMY     BREAST BIOPSY Left    neg   BREAST BIOPSY Left    core bx- neg   BREAST BIOPSY Left 06/13/2019   [place] bx 2:00 coil    TUBAL LIGATION      Family History  Problem Relation Age of Onset   CAD Sister    Breast cancer Sister 49       twin sister   Cancer Sister    Cancer Maternal Grandmother     Social History   Socioeconomic History   Marital status: Married    Spouse name: Not on file   Number of children: Not on file   Years of education: Not on file   Highest education level: Not on file  Occupational History   Not on file  Tobacco Use   Smoking status: Never   Smokeless tobacco: Never  Vaping Use   Vaping Use: Never used  Substance and Sexual Activity   Alcohol use: Never   Drug use: Never    Sexual activity: Yes    Birth control/protection: None  Other Topics Concern   Not on file  Social History Narrative   Not on file   Social Determinants of Health   Financial Resource Strain: Not on file  Food Insecurity: No Food Insecurity   Worried About Running Out of Food in the Last Year: Never true   [place] in the Last Year: Never true  Transportation Needs: No Transportation Needs   Lack of Transportation (Medical): No   Lack of Transportation (Non-Medical): No  Physical Activity: Not on file  Stress: Not on file  Social Connections: Not on file  Intimate Partner Violence: Not on file    Outpatient Medications Prior to Visit  Medication Sig Dispense Refill   acetaminophen (TYLENOL) 325 MG tablet Take 650 mg by mouth every 6 (six) hours as needed.     ascorbic acid (VITAMIN C) 500 MG tablet Take 1 tablet (500 mg total) by mouth daily. 30 tablet 0   cyclobenzaprine (FLEXERIL) 10 MG tablet TAKE ONE TABLET BY MOUTH 2 TIMES A DAY 60 tablet 1   omeprazole (PRILOSEC) 20 MG capsule TAKE ONE CAPSULE BY MOUTH EVERY DAY 90 capsule 1   primidone (MYSOLINE) 50 MG  tablet Take 1 tablet 50 mg twice daily 60 tablet 3   atorvastatin (LIPITOR) 40 MG tablet Take 1 tablet (40 mg total) by mouth daily. 30 tablet 2   levothyroxine (SYNTHROID) 75 MCG tablet TAKE ONE TABLET BY MOUTH EVERY DAY BEFORE BREAKFAST. 30 tablet 2   lisinopril (ZESTRIL) 5 MG tablet Take 1 tablet (5 mg total) by mouth daily. 30 tablet 2   metFORMIN (GLUCOPHAGE) 1000 MG tablet TAKE ONE TABLET (1000MG) BY MOUTH EVERY DAY WITH BREAKFAST. 30 tablet 2   cholecalciferol (VITAMIN D3) 25 MCG (1000 UNIT) tablet Take 1,000 Units by mouth daily.     zinc sulfate 220 (50 Zn) MG capsule Take 1 capsule (220 mg total) by mouth daily. 30 capsule 0   No facility-administered medications prior to visit.    Allergies  Allergen Reactions   Aspirin Nausea And Vomiting    ROS Review of Systems  Constitutional: Negative.    Respiratory: Negative.    Cardiovascular: Negative.   Endocrine: Negative.   Skin: Negative.   Neurological: Negative.   Psychiatric/Behavioral: Negative.       Objective:    Physical Exam HENT:     Head: Normocephalic and atraumatic.     Mouth/Throat:     Mouth: Mucous membranes are moist.  Eyes:     Extraocular Movements: Extraocular movements intact.     Conjunctiva/sclera: Conjunctivae normal.     Pupils: Pupils are equal, round, and reactive to light.  Cardiovascular:     Rate and Rhythm: Normal rate and regular rhythm.     Pulses: Normal pulses.     Heart sounds: Normal heart sounds.  Pulmonary:     Effort: Pulmonary effort is normal.     Breath sounds: Normal breath sounds.  Neurological:     General: No focal deficit present.     Mental Status: She is alert and oriented to person, place, and time. Mental status is at baseline.  Psychiatric:        Mood and Affect: Mood normal.        Behavior: Behavior normal.        Thought Content: Thought content normal.        Judgment: Judgment normal.    BP 124/81 (BP Location: Right Arm, Patient Position: Sitting, Cuff Size: Large)   Pulse 86   Temp (!) 96.8 F (36 C)   Resp 16   Ht 5' 5"  (1.651 m)   Wt 215 lb 3.2 oz (97.6 kg)   SpO2 97%   BMI 35.81 kg/m  Wt Readings from Last 3 Encounters:  04/03/21 215 lb 3.2 oz (97.6 kg)  03/27/21 213 lb (96.6 kg)  01/09/21 207 lb 14.4 oz (94.3 kg)   Encouraged weight loss  Health Maintenance Due  Topic Date Due   Pneumococcal Vaccine 59-1 Years old (1 - PCV) Never done   Hepatitis C Screening  Never done   TETANUS/TDAP  Never done   Zoster Vaccines- Shingrix (1 of 2) Never done   PAP SMEAR-Modifier  Never done   COLONOSCOPY (Pts 45-59yr Insurance coverage will need to be confirmed)  Never done   COVID-19 Vaccine (3 - Pfizer risk series) 01/27/2020    There are no preventive care reminders to display for this patient.  Lab Results  Component Value Date   TSH  1.150 12/26/2020   Lab Results  Component Value Date   WBC 5.1 12/07/2019   HGB 12.5 12/07/2019   HCT 38.3 12/07/2019   MCV 86 12/07/2019  PLT 418 12/07/2019   Lab Results  Component Value Date   NA 142 03/27/2021   K 4.5 03/27/2021   CO2 24 03/27/2021   GLUCOSE 101 (H) 03/27/2021   BUN 13 03/27/2021   CREATININE 1.00 03/27/2021   BILITOT 0.4 12/26/2020   ALKPHOS 101 12/26/2020   AST 21 12/26/2020   ALT 17 12/26/2020   PROT 6.9 12/26/2020   ALBUMIN 3.9 12/26/2020   CALCIUM 9.3 03/27/2021   ANIONGAP 9 10/13/2019   EGFR 63 03/27/2021   Lab Results  Component Value Date   CHOL 135 01/25/2020   Lab Results  Component Value Date   HDL 46 01/25/2020   Lab Results  Component Value Date   LDLCALC 72 01/25/2020   Lab Results  Component Value Date   TRIG 87 01/25/2020   Lab Results  Component Value Date   CHOLHDL 2.9 01/25/2020   Lab Results  Component Value Date   HGBA1C 6.4 (H) 03/27/2021      Assessment & Plan:    1. History of elevated lipids -She will continue on current medication, low fat/cholesterol diet and exercise as tolerated. - atorvastatin (LIPITOR) 40 MG tablet; Take 1 tablet (40 mg total) by mouth daily.  Dispense: 30 tablet; Refill: 2 -Will recheck Lipid panel; Future  2. Hypothyroidism, unspecified type -She Euthyroid and will continue on current medication. - levothyroxine (SYNTHROID) 75 MCG tablet; TAKE ONE TABLET BY MOUTH EVERY DAY BEFORE BREAKFAST.  Dispense: 30 tablet; Refill: 2  3. Essential hypertension -Her blood pressure is under control, she will continue current medication, DASH diet and exercises as tolerated. - lisinopril (ZESTRIL) 5 MG tablet; Take 1 tablet (5 mg total) by mouth daily.  Dispense: 30 tablet; Refill: 2  4. Prediabetes - Her HgbA1c increased to 6.4%, she declined increasing Metformin to 1000 mg bid, she states that she will modify her diet and exercise as tolerated. - metFORMIN (GLUCOPHAGE) 1000 MG tablet;  TAKE ONE TABLET (1000MG) BY MOUTH EVERY DAY WITH BREAKFAST.  Dispense: 30 tablet; Refill: 2 - Will recheck HgB A1c in 3 months.    Follow-up: Return in about 14 weeks (around 07/10/2021), or if symptoms worsen or fail to improve.    Ranell Skibinski Jerold Coombe, NP

## 2021-04-30 ENCOUNTER — Other Ambulatory Visit: Payer: Self-pay

## 2021-05-02 ENCOUNTER — Other Ambulatory Visit: Payer: Self-pay | Admitting: Gerontology

## 2021-05-02 ENCOUNTER — Other Ambulatory Visit: Payer: Self-pay

## 2021-05-02 DIAGNOSIS — G250 Essential tremor: Secondary | ICD-10-CM

## 2021-05-02 MED ORDER — PRIMIDONE 50 MG PO TABS
ORAL_TABLET | Freq: Two times a day (BID) | ORAL | 1 refills | Status: DC
Start: 2021-05-02 — End: 2021-07-10
  Filled 2021-05-02: qty 60, 30d supply, fill #0

## 2021-05-09 ENCOUNTER — Other Ambulatory Visit: Payer: Self-pay

## 2021-05-09 MED FILL — Cyclobenzaprine HCl Tab 10 MG: ORAL | 30 days supply | Qty: 60 | Fill #0 | Status: AC

## 2021-07-03 ENCOUNTER — Other Ambulatory Visit: Payer: Self-pay

## 2021-07-03 ENCOUNTER — Other Ambulatory Visit: Payer: Medicaid Other

## 2021-07-03 DIAGNOSIS — R7303 Prediabetes: Secondary | ICD-10-CM

## 2021-07-03 DIAGNOSIS — Z8639 Personal history of other endocrine, nutritional and metabolic disease: Secondary | ICD-10-CM

## 2021-07-04 LAB — LIPID PANEL
Chol/HDL Ratio: 3.7 ratio (ref 0.0–4.4)
Cholesterol, Total: 159 mg/dL (ref 100–199)
HDL: 43 mg/dL (ref >39)
LDL Chol Calc (NIH): 98 mg/dL (ref 0–99)
Triglycerides: 95 mg/dL (ref 0–149)
VLDL Cholesterol Cal: 18 mg/dL (ref 5–40)

## 2021-07-04 LAB — HEMOGLOBIN A1C
Est. average glucose Bld gHb Est-mCnc: 126 mg/dL
Hgb A1c MFr Bld: 6 % — ABNORMAL HIGH (ref 4.8–5.6)

## 2021-07-10 ENCOUNTER — Encounter: Payer: Self-pay | Admitting: Gerontology

## 2021-07-10 ENCOUNTER — Other Ambulatory Visit: Payer: Self-pay

## 2021-07-10 ENCOUNTER — Ambulatory Visit: Payer: Medicaid Other | Admitting: Gerontology

## 2021-07-10 VITALS — BP 136/84 | HR 73 | Temp 96.8°F | Resp 16 | Ht 65.0 in | Wt 213.8 lb

## 2021-07-10 DIAGNOSIS — E039 Hypothyroidism, unspecified: Secondary | ICD-10-CM

## 2021-07-10 DIAGNOSIS — Z8719 Personal history of other diseases of the digestive system: Secondary | ICD-10-CM

## 2021-07-10 DIAGNOSIS — R7303 Prediabetes: Secondary | ICD-10-CM

## 2021-07-10 DIAGNOSIS — I10 Essential (primary) hypertension: Secondary | ICD-10-CM

## 2021-07-10 DIAGNOSIS — G250 Essential tremor: Secondary | ICD-10-CM

## 2021-07-10 MED ORDER — METFORMIN HCL 1000 MG PO TABS
ORAL_TABLET | ORAL | 4 refills | Status: DC
  Filled 2021-07-10 – 2022-02-12 (×3): qty 30, 30d supply, fill #0

## 2021-07-10 MED ORDER — LISINOPRIL 5 MG PO TABS
5.0000 mg | ORAL_TABLET | Freq: Every day | ORAL | 4 refills | Status: DC
  Filled 2021-07-10: qty 30, 30d supply, fill #0
  Filled 2022-02-04: qty 30, 30d supply, fill #1
  Filled 2022-02-12: qty 30, 30d supply, fill #0

## 2021-07-10 MED ORDER — PRIMIDONE 50 MG PO TABS
ORAL_TABLET | Freq: Two times a day (BID) | ORAL | 4 refills | Status: DC
  Filled 2021-07-10: qty 60, 30d supply, fill #0
  Filled 2022-02-04: qty 60, 30d supply, fill #1
  Filled 2022-02-12: qty 60, 30d supply, fill #0

## 2021-07-10 MED ORDER — LEVOTHYROXINE SODIUM 75 MCG PO TABS
ORAL_TABLET | ORAL | 4 refills | Status: DC
  Filled 2021-07-10: qty 30, 30d supply, fill #0
  Filled 2022-02-04: qty 30, 30d supply, fill #1
  Filled 2022-02-12: qty 30, 30d supply, fill #0
  Filled 2022-02-12: qty 19, 19d supply, fill #0

## 2021-07-10 MED ORDER — OMEPRAZOLE 20 MG PO CPDR
DELAYED_RELEASE_CAPSULE | Freq: Every day | ORAL | 1 refills | Status: DC
  Filled 2021-07-10: qty 30, 30d supply, fill #0
  Filled 2021-09-23: qty 30, 30d supply, fill #1
  Filled 2022-02-04: qty 30, 30d supply, fill #2
  Filled 2022-02-12: qty 30, 30d supply, fill #0

## 2021-07-10 NOTE — Patient Instructions (Signed)

## 2021-07-10 NOTE — Progress Notes (Signed)
Established Patient Office Visit  Subjective:  Patient ID: Rebekah Salinas, female    DOB: Mar 11, 1956  Age: 65 y.o. MRN: [id number]  CC:  Chief Complaint  Patient presents with   Follow-up    Labs drawn 07/03/21   Hypertension   Prediabetes   Gastroesophageal Reflux    Needs refill on Omeprazole    HPI Rebekah Salinas  is a 65 y/o female who has history of Hypertension, Thyroid disease, Voice Tremors, presents for routine follow up, lab review and medication refill. Her HgbA1c decreased from 6.4% to 6% and her Lipid panel was unremarkable. She states that she's compliant with her medications, denies side effects and continues to make healthy lifestyle changes. She states that her acid reflux is under control with taking Omeprazole 20 mg daily, and will decide when to do her Colonoscopy screening. She denies chest pain, palpitation, myalgia, cold/heat intolerance, weight gain and constipation. Overall, she states that she's doing well and offers no further complaint.  Past Medical History:  Diagnosis Date   Diabetes mellitus without complication ([place])    Hypertension    Thyroid disease     Past Surgical History:  Procedure Laterality Date   ABDOMINAL HYSTERECTOMY     BREAST BIOPSY Left    neg   BREAST BIOPSY Left    core bx- neg   BREAST BIOPSY Left 06/13/2019   [place] bx 2:00 coil    TUBAL LIGATION      Family History  Problem Relation Age of Onset   Other Mother        murdered   Other Father        unknown medical history   CAD Sister    Breast cancer Sister 46       twin sister   Cancer Sister    Cancer Maternal Grandmother     Social History   Socioeconomic History   Marital status: Married    Spouse name: Not on file   Number of children: Not on file   Years of education: Not on file   Highest education level: Not on file  Occupational History   Not on file  Tobacco Use   Smoking status: Never   Smokeless tobacco: Never  Vaping Use   Vaping Use: Never  used  Substance and Sexual Activity   Alcohol use: Never   Drug use: Never   Sexual activity: Yes    Birth control/protection: None  Other Topics Concern   Not on file  Social History Narrative   Not on file   Social Determinants of Health   Financial Resource Strain: Not on file  Food Insecurity: No Food Insecurity   Worried About Running Out of Food in the Last Year: Never true   [place] in the Last Year: Never true  Transportation Needs: No Transportation Needs   Lack of Transportation (Medical): No   Lack of Transportation (Non-Medical): No  Physical Activity: Not on file  Stress: Not on file  Social Connections: Not on file  Intimate Partner Violence: Not on file    Outpatient Medications Prior to Visit  Medication Sig Dispense Refill   acetaminophen (TYLENOL) 325 MG tablet Take 650 mg by mouth every 6 (six) hours as needed.     ascorbic acid (VITAMIN C) 500 MG tablet Take 1 tablet (500 mg total) by mouth daily. 30 tablet 0   atorvastatin (LIPITOR) 40 MG tablet Take 1 tablet (40 mg total) by mouth daily. 30 tablet 2  cholecalciferol (VITAMIN D3) 25 MCG (1000 UNIT) tablet Take 1,000 Units by mouth daily.     cyclobenzaprine (FLEXERIL) 10 MG tablet TAKE ONE TABLET BY MOUTH 2 TIMES A DAY 60 tablet 1   zinc sulfate 220 (50 Zn) MG capsule Take 1 capsule (220 mg total) by mouth daily. 30 capsule 0   levothyroxine (SYNTHROID) 75 MCG tablet TAKE ONE TABLET BY MOUTH EVERY DAY BEFORE BREAKFAST. 30 tablet 2   lisinopril (ZESTRIL) 5 MG tablet Take 1 tablet (5 mg total) by mouth once daily. 30 tablet 2   metFORMIN (GLUCOPHAGE) 1000 MG tablet TAKE ONE TABLET (1000MG) BY MOUTH EVERY DAY WITH BREAKFAST. 30 tablet 2   primidone (MYSOLINE) 50 MG tablet TAKE ONE TABLET BY MOUTH 2 TIMES A DAY 60 tablet 1   omeprazole (PRILOSEC) 20 MG capsule TAKE ONE CAPSULE BY MOUTH EVERY DAY 90 capsule 1   No facility-administered medications prior to visit.    Allergies  Allergen Reactions    Aspirin Nausea And Vomiting    ROS Review of Systems  Constitutional: Negative.   Eyes: Negative.   Respiratory: Negative.    Cardiovascular: Negative.   Endocrine: Negative.   Skin: Negative.   Neurological: Negative.   Psychiatric/Behavioral: Negative.       Objective:    Physical Exam HENT:     Head: Normocephalic and atraumatic.     Mouth/Throat:     Mouth: Mucous membranes are moist.  Eyes:     Extraocular Movements: Extraocular movements intact.     Conjunctiva/sclera: Conjunctivae normal.     Pupils: Pupils are equal, round, and reactive to light.  Cardiovascular:     Rate and Rhythm: Normal rate and regular rhythm.     Pulses: Normal pulses.     Heart sounds: Normal heart sounds.  Pulmonary:     Effort: Pulmonary effort is normal.     Breath sounds: Normal breath sounds.  Skin:    General: Skin is warm.  Neurological:     General: No focal deficit present.     Mental Status: She is alert and oriented to person, place, and time. Mental status is at baseline.  Psychiatric:        Mood and Affect: Mood normal.        Behavior: Behavior normal.        Thought Content: Thought content normal.        Judgment: Judgment normal.    BP 136/84 (BP Location: Left Arm, Patient Position: Sitting, Cuff Size: Large) Comment: manual  Pulse 73   Temp (!) 96.8 F (36 C)   Resp 16   Ht 5' 5" (1.651 m)   Wt 213 lb 12.8 oz (97 kg)   SpO2 98%   BMI 35.58 kg/m  Wt Readings from Last 3 Encounters:  07/10/21 213 lb 12.8 oz (97 kg)  07/03/21 213 lb 3.2 oz (96.7 kg)  04/03/21 215 lb 3.2 oz (97.6 kg)   Encouraged weight loss   Health Maintenance Due  Topic Date Due   Pneumococcal Vaccine 21-22 Years old (1 - PCV) Never done   Hepatitis C Screening  Never done   TETANUS/TDAP  Never done   Zoster Vaccines- Shingrix (1 of 2) Never done   PAP SMEAR-Modifier  Never done   COLONOSCOPY (Pts 45-103yr Insurance coverage will need to be confirmed)  Never done   COVID-19  Vaccine (3 - Pfizer risk series) 01/27/2020   INFLUENZA VACCINE  04/22/2021    There are no preventive care  reminders to display for this patient.  Lab Results  Component Value Date   TSH 1.150 12/26/2020   Lab Results  Component Value Date   WBC 5.1 12/07/2019   HGB 12.5 12/07/2019   HCT 38.3 12/07/2019   MCV 86 12/07/2019   PLT 418 12/07/2019   Lab Results  Component Value Date   NA 142 03/27/2021   K 4.5 03/27/2021   CO2 24 03/27/2021   GLUCOSE 101 (H) 03/27/2021   BUN 13 03/27/2021   CREATININE 1.00 03/27/2021   BILITOT 0.4 12/26/2020   ALKPHOS 101 12/26/2020   AST 21 12/26/2020   ALT 17 12/26/2020   PROT 6.9 12/26/2020   ALBUMIN 3.9 12/26/2020   CALCIUM 9.3 03/27/2021   ANIONGAP 9 10/13/2019   EGFR 63 03/27/2021   Lab Results  Component Value Date   CHOL 159 07/03/2021   Lab Results  Component Value Date   HDL 43 07/03/2021   Lab Results  Component Value Date   LDLCALC 98 07/03/2021   Lab Results  Component Value Date   TRIG 95 07/03/2021   Lab Results  Component Value Date   CHOLHDL 3.7 07/03/2021   Lab Results  Component Value Date   HGBA1C 6.0 (H) 07/03/2021      Assessment & Plan:    1. Hypothyroidism, unspecified type - She is euthyroid, will continue her medication and will recheck in 4 months. - levothyroxine (SYNTHROID) 75 MCG tablet; TAKE ONE TABLET BY MOUTH EVERY DAY BEFORE BREAKFAST.  Dispense: 30 tablet; Refill: 4 - TSH; Future  2. Essential hypertension - Her blood pressure is under control, will continue current medication, DASH diet and exercise as tolerated. - lisinopril (ZESTRIL) 5 MG tablet; Take 1 tablet (5 mg total) by mouth once daily.  Dispense: 30 tablet; Refill: 4 - Comp Met (CMET); Future - CBC w/Diff; Future  3. Prediabetes - Her HgbA1c was 6%, she will continue on current medication, low carb/non concentrated sweet diet and exercise as tolerated. - metFORMIN (GLUCOPHAGE) 1000 MG tablet; TAKE ONE TABLET  (1000MG) BY MOUTH EVERY DAY WITH BREAKFAST.  Dispense: 30 tablet; Refill: 4 - HgB A1c; Future - Lipid panel; Future  4. History of gastroesophageal reflux (GERD) - Her acid reflux is under control, will continue on current medication -Avoid spicy, fatty and fried food -Avoid sodas and sour juices -Avoid heavy meals -Avoid eating 4 hours before bedtime -Elevate head of bed at night - omeprazole (PRILOSEC) 20 MG capsule; TAKE ONE CAPSULE BY MOUTH EVERY DAY  Dispense: 90 capsule; Refill: 1  5. Benign head tremor - She will continue on current medication and advised to notify clinic with worsening symptoms. - primidone (MYSOLINE) 50 MG tablet; TAKE ONE TABLET BY MOUTH 2 TIMES A DAY  Dispense: 60 tablet; Refill: 4    Follow-up: Return in about 4 months (around 11/19/2021), or if symptoms worsen or fail to improve.    Chioma E Iloabachie, NP 

## 2021-09-23 ENCOUNTER — Other Ambulatory Visit: Payer: Self-pay | Admitting: Gerontology

## 2021-09-23 DIAGNOSIS — M25512 Pain in left shoulder: Secondary | ICD-10-CM

## 2021-09-23 DIAGNOSIS — M545 Low back pain, unspecified: Secondary | ICD-10-CM

## 2021-09-23 DIAGNOSIS — M542 Cervicalgia: Secondary | ICD-10-CM

## 2021-09-23 DIAGNOSIS — G8929 Other chronic pain: Secondary | ICD-10-CM

## 2021-09-23 MED ORDER — CYCLOBENZAPRINE HCL 10 MG PO TABS
ORAL_TABLET | Freq: Two times a day (BID) | ORAL | 0 refills | Status: DC
  Filled 2021-09-23: qty 60, 30d supply, fill #0

## 2021-09-24 ENCOUNTER — Other Ambulatory Visit: Payer: Self-pay

## 2021-11-11 ENCOUNTER — Other Ambulatory Visit: Payer: Self-pay

## 2021-11-13 ENCOUNTER — Other Ambulatory Visit: Payer: Self-pay

## 2021-11-13 ENCOUNTER — Other Ambulatory Visit: Payer: Medicaid Other

## 2021-11-13 DIAGNOSIS — R7303 Prediabetes: Secondary | ICD-10-CM

## 2021-11-13 DIAGNOSIS — I10 Essential (primary) hypertension: Secondary | ICD-10-CM

## 2021-11-13 DIAGNOSIS — E039 Hypothyroidism, unspecified: Secondary | ICD-10-CM

## 2021-11-14 LAB — COMPREHENSIVE METABOLIC PANEL
ALT: 12 IU/L (ref 0–32)
AST: 17 IU/L (ref 0–40)
Albumin/Globulin Ratio: 1.6 (ref 1.2–2.2)
Albumin: 4.2 g/dL (ref 3.8–4.8)
Alkaline Phosphatase: 117 IU/L (ref 44–121)
BUN/Creatinine Ratio: 8 — ABNORMAL LOW (ref 12–28)
BUN: 9 mg/dL (ref 8–27)
Bilirubin Total: 0.4 mg/dL (ref 0.0–1.2)
CO2: 25 mmol/L (ref 20–29)
Calcium: 9.2 mg/dL (ref 8.7–10.3)
Chloride: 103 mmol/L (ref 96–106)
Creatinine, Ser: 1.07 mg/dL — ABNORMAL HIGH (ref 0.57–1.00)
Globulin, Total: 2.6 g/dL (ref 1.5–4.5)
Glucose: 93 mg/dL (ref 70–99)
Potassium: 4.1 mmol/L (ref 3.5–5.2)
Sodium: 143 mmol/L (ref 134–144)
Total Protein: 6.8 g/dL (ref 6.0–8.5)
eGFR: 58 mL/min/{1.73_m2} — ABNORMAL LOW (ref >59)

## 2021-11-14 LAB — LIPID PANEL
Chol/HDL Ratio: 3.6 ratio (ref 0.0–4.4)
Cholesterol, Total: 150 mg/dL (ref 100–199)
HDL: 42 mg/dL (ref >39)
LDL Chol Calc (NIH): 92 mg/dL (ref 0–99)
Triglycerides: 84 mg/dL (ref 0–149)
VLDL Cholesterol Cal: 16 mg/dL (ref 5–40)

## 2021-11-14 LAB — TSH: TSH: 1.73 u[IU]/mL (ref 0.450–4.500)

## 2021-11-14 LAB — CBC WITH DIFFERENTIAL/PLATELET
Basophils Absolute: 0.1 10*3/uL (ref 0.0–0.2)
Basos: 1 %
EOS (ABSOLUTE): 0.2 10*3/uL (ref 0.0–0.4)
Eos: 3 %
Hematocrit: 39.1 % (ref 34.0–46.6)
Hemoglobin: 12.7 g/dL (ref 11.1–15.9)
Immature Grans (Abs): 0 10*3/uL (ref 0.0–0.1)
Immature Granulocytes: 0 %
Lymphocytes Absolute: 1.6 10*3/uL (ref 0.7–3.1)
Lymphs: 31 %
MCH: 27 pg (ref 26.6–33.0)
MCHC: 32.5 g/dL (ref 31.5–35.7)
MCV: 83 fL (ref 79–97)
Monocytes Absolute: 0.5 10*3/uL (ref 0.1–0.9)
Monocytes: 10 %
Neutrophils Absolute: 2.7 10*3/uL (ref 1.4–7.0)
Neutrophils: 55 %
Platelets: 371 10*3/uL (ref 150–450)
RBC: 4.7 x10E6/uL (ref 3.77–5.28)
RDW: 14.1 % (ref 11.7–15.4)
WBC: 5 10*3/uL (ref 3.4–10.8)

## 2021-11-14 LAB — HEMOGLOBIN A1C
Est. average glucose Bld gHb Est-mCnc: 128 mg/dL
Hgb A1c MFr Bld: 6.1 % — ABNORMAL HIGH (ref 4.8–5.6)

## 2021-11-19 ENCOUNTER — Other Ambulatory Visit: Payer: Self-pay

## 2021-11-19 ENCOUNTER — Encounter: Payer: Self-pay | Admitting: Gerontology

## 2021-11-19 ENCOUNTER — Ambulatory Visit: Payer: Medicaid Other | Admitting: Gerontology

## 2021-11-19 VITALS — BP 149/82 | HR 79 | Temp 97.7°F | Resp 16 | Ht 65.0 in | Wt 211.6 lb

## 2021-11-19 DIAGNOSIS — R7303 Prediabetes: Secondary | ICD-10-CM

## 2021-11-19 DIAGNOSIS — R7989 Other specified abnormal findings of blood chemistry: Secondary | ICD-10-CM

## 2021-11-19 DIAGNOSIS — E039 Hypothyroidism, unspecified: Secondary | ICD-10-CM

## 2021-11-19 DIAGNOSIS — I10 Essential (primary) hypertension: Secondary | ICD-10-CM

## 2021-11-19 NOTE — Progress Notes (Signed)
Established Patient Office Visit  Subjective:  Patient ID: Rebekah Salinas, female    DOB: February 21, 1956  Age: 66 y.o. MRN: [id number]  CC:  Chief Complaint  Patient presents with   Follow-up    Labs drawn 11/13/21    HPI Rebekah Salinas   is a 66 y/o female who has history of Hypertension, Thyroid disease, Voice Tremors, presents for routine follow up, lab review and medication refill.  She states that she is compliant with her medication, denies side effects and continues to make healthy lifestyle changes.  Lab work unremarkable, except serum creatinine 1.07 mg per DL and eGFR 58.  Hemoglobin A1c increased from 6% to 6.1%.  She states that her mood is good, denies suicidal or homicidal ideation.  Overall, she states that she is doing well and offers no further complaint.  Past Medical History:  Diagnosis Date   Diabetes mellitus without complication ([place])    Hypertension    Thyroid disease     Past Surgical History:  Procedure Laterality Date   ABDOMINAL HYSTERECTOMY     BREAST BIOPSY Left    neg   BREAST BIOPSY Left    core bx- neg   BREAST BIOPSY Left 06/13/2019   [place] bx 2:00 coil    TUBAL LIGATION      Family History  Problem Relation Age of Onset   Other Mother        murdered   Other Father        unknown medical history   CAD Sister    Breast cancer Sister 73       twin sister   Cancer Sister    Cancer Maternal Grandmother     Social History   Socioeconomic History   Marital status: Married    Spouse name: Not on file   Number of children: Not on file   Years of education: Not on file   Highest education level: Not on file  Occupational History   Not on file  Tobacco Use   Smoking status: Never   Smokeless tobacco: Never  Vaping Use   Vaping Use: Never used  Substance and Sexual Activity   Alcohol use: Never   Drug use: Never   Sexual activity: Yes    Birth control/protection: None  Other Topics Concern   Not on file  Social History Narrative    Not on file   Social Determinants of Health   Financial Resource Strain: Not on file  Food Insecurity: No Food Insecurity   Worried About Running Out of Food in the Last Year: Never true   [place] in the Last Year: Never true  Transportation Needs: No Transportation Needs   Lack of Transportation (Medical): No   Lack of Transportation (Non-Medical): No  Physical Activity: Not on file  Stress: Not on file  Social Connections: Not on file  Intimate Partner Violence: Not on file    Outpatient Medications Prior to Visit  Medication Sig Dispense Refill   acetaminophen (TYLENOL) 325 MG tablet Take 650 mg by mouth every 6 (six) hours as needed.     ascorbic acid (VITAMIN C) 500 MG tablet Take 1 tablet (500 mg total) by mouth daily. 30 tablet 0   atorvastatin (LIPITOR) 40 MG tablet Take 1 tablet (40 mg total) by mouth daily. 30 tablet 2   cholecalciferol (VITAMIN D3) 25 MCG (1000 UNIT) tablet Take 1,000 Units by mouth daily.     cyclobenzaprine (FLEXERIL) 10 MG tablet TAKE ONE  TABLET BY MOUTH 2 TIMES A DAY 60 tablet 0   levothyroxine (SYNTHROID) 75 MCG tablet TAKE ONE TABLET BY MOUTH EVERY DAY BEFORE BREAKFAST. 30 tablet 4   lisinopril (ZESTRIL) 5 MG tablet Take 1 tablet (5 mg total) by mouth once daily. 30 tablet 4   metFORMIN (GLUCOPHAGE) 1000 MG tablet TAKE ONE TABLET ($RemoveBef'1000MG'veHwqdiHCj$ ) BY MOUTH EVERY DAY WITH BREAKFAST. 30 tablet 4   omeprazole (PRILOSEC) 20 MG capsule TAKE ONE CAPSULE BY MOUTH EVERY DAY 90 capsule 1   primidone (MYSOLINE) 50 MG tablet TAKE ONE TABLET BY MOUTH 2 TIMES A DAY 60 tablet 4   zinc sulfate 220 (50 Zn) MG capsule Take 1 capsule (220 mg total) by mouth daily. 30 capsule 0   No facility-administered medications prior to visit.    Allergies  Allergen Reactions   Aspirin Nausea And Vomiting    ROS Review of Systems  Constitutional: Negative.   Respiratory: Negative.    Cardiovascular: Negative.   Endocrine: Negative.   Skin: Negative.   Neurological:  Negative.   Psychiatric/Behavioral: Negative.       Objective:    Physical Exam HENT:     Head: Normocephalic and atraumatic.     Mouth/Throat:     Mouth: Mucous membranes are moist.  Eyes:     Extraocular Movements: Extraocular movements intact.     Conjunctiva/sclera: Conjunctivae normal.     Pupils: Pupils are equal, round, and reactive to light.  Cardiovascular:     Rate and Rhythm: Normal rate and regular rhythm.     Pulses: Normal pulses.     Heart sounds: Normal heart sounds.  Pulmonary:     Effort: Pulmonary effort is normal.     Breath sounds: Normal breath sounds.  Skin:    General: Skin is warm.  Neurological:     General: No focal deficit present.     Mental Status: She is alert and oriented to person, place, and time. Mental status is at baseline.  Psychiatric:        Mood and Affect: Mood normal.        Behavior: Behavior normal.        Thought Content: Thought content normal.        Judgment: Judgment normal.    BP (!) 149/82 (BP Location: Left Arm, Patient Position: Sitting, Cuff Size: Large)    Pulse 79    Temp 97.7 F (36.5 C) (Oral)    Resp 16    Ht $R'5\' 5"'qv$  (1.651 m)    Wt 211 lb 9.6 oz (96 kg)    SpO2 94%    BMI 35.21 kg/m  Wt Readings from Last 3 Encounters:  11/19/21 211 lb 9.6 oz (96 kg)  11/13/21 210 lb 1.6 oz (95.3 kg)  07/10/21 213 lb 12.8 oz (97 kg)   Encouraged weight loss  Health Maintenance Due  Topic Date Due   Hepatitis C Screening  Never done   TETANUS/TDAP  Never done   Zoster Vaccines- Shingrix (1 of 2) Never done   PAP SMEAR-Modifier  Never done   COLONOSCOPY (Pts 45-7yrs Insurance coverage will need to be confirmed)  Never done   COVID-19 Vaccine (3 - Pfizer risk series) 01/27/2020   INFLUENZA VACCINE  04/22/2021    There are no preventive care reminders to display for this patient.  Lab Results  Component Value Date   TSH 1.730 11/13/2021   Lab Results  Component Value Date   WBC 5.0 11/13/2021   HGB 12.7 11/13/2021  HCT 39.1 11/13/2021   MCV 83 11/13/2021   PLT 371 11/13/2021   Lab Results  Component Value Date   NA 143 11/13/2021   K 4.1 11/13/2021   CO2 25 11/13/2021   GLUCOSE 93 11/13/2021   BUN 9 11/13/2021   CREATININE 1.07 (H) 11/13/2021   BILITOT 0.4 11/13/2021   ALKPHOS 117 11/13/2021   AST 17 11/13/2021   ALT 12 11/13/2021   PROT 6.8 11/13/2021   ALBUMIN 4.2 11/13/2021   CALCIUM 9.2 11/13/2021   ANIONGAP 9 10/13/2019   EGFR 58 (L) 11/13/2021   Lab Results  Component Value Date   CHOL 150 11/13/2021   Lab Results  Component Value Date   HDL 42 11/13/2021   Lab Results  Component Value Date   LDLCALC 92 11/13/2021   Lab Results  Component Value Date   TRIG 84 11/13/2021   Lab Results  Component Value Date   CHOLHDL 3.6 11/13/2021   Lab Results  Component Value Date   HGBA1C 6.1 (H) 11/13/2021      Assessment & Plan:    1. Essential hypertension -Her blood pressure is under control, she will continue on her current medication, DASH diet and exercise as tolerated.  2. Hypothyroidism, unspecified type -Her TSH is euthyroid, she will continue on current medication.  3. Elevated serum creatinine -Her serum creatinine was elevated, she was encouraged to increase water intake.  4. Prediabetes -Her hemoglobin A1c was 6.1%, she will continue on current medication was encouraged to continue on low-carb/low concentrated sweet diet and exercise as tolerated.     Follow-up: Return in about 3 months (around 02/06/2022), or if symptoms worsen or fail to improve.    Riyana Biel Jerold Coombe, NP

## 2021-11-19 NOTE — Patient Instructions (Signed)

## 2021-12-26 ENCOUNTER — Other Ambulatory Visit: Payer: Self-pay | Admitting: Physician Assistant

## 2021-12-26 ENCOUNTER — Other Ambulatory Visit: Payer: Self-pay | Admitting: Gerontology

## 2021-12-26 DIAGNOSIS — Z1231 Encounter for screening mammogram for malignant neoplasm of breast: Secondary | ICD-10-CM

## 2022-01-24 ENCOUNTER — Other Ambulatory Visit: Payer: Self-pay

## 2022-02-04 ENCOUNTER — Ambulatory Visit: Payer: Medicaid Other | Admitting: Gerontology

## 2022-02-04 ENCOUNTER — Other Ambulatory Visit: Payer: Self-pay | Admitting: Gerontology

## 2022-02-04 ENCOUNTER — Other Ambulatory Visit: Payer: Self-pay

## 2022-02-04 DIAGNOSIS — G8929 Other chronic pain: Secondary | ICD-10-CM

## 2022-02-04 DIAGNOSIS — M542 Cervicalgia: Secondary | ICD-10-CM

## 2022-02-05 ENCOUNTER — Other Ambulatory Visit: Payer: Self-pay | Admitting: Gerontology

## 2022-02-05 ENCOUNTER — Other Ambulatory Visit: Payer: Self-pay

## 2022-02-05 DIAGNOSIS — G8929 Other chronic pain: Secondary | ICD-10-CM

## 2022-02-05 DIAGNOSIS — M542 Cervicalgia: Secondary | ICD-10-CM

## 2022-02-05 DIAGNOSIS — M545 Low back pain, unspecified: Secondary | ICD-10-CM

## 2022-02-05 MED FILL — Cyclobenzaprine HCl Tab 10 MG: ORAL | Qty: 60 | Fill #0 | Status: CN

## 2022-02-06 ENCOUNTER — Encounter: Payer: Self-pay | Admitting: Gerontology

## 2022-02-06 ENCOUNTER — Other Ambulatory Visit: Payer: Self-pay

## 2022-02-06 ENCOUNTER — Ambulatory Visit: Payer: Medicaid Other | Admitting: Gerontology

## 2022-02-06 VITALS — BP 129/85 | HR 85 | Temp 98.3°F | Resp 16 | Ht 65.0 in | Wt 214.8 lb

## 2022-02-06 DIAGNOSIS — G250 Essential tremor: Secondary | ICD-10-CM

## 2022-02-06 DIAGNOSIS — R7303 Prediabetes: Secondary | ICD-10-CM

## 2022-02-06 LAB — POCT GLYCOSYLATED HEMOGLOBIN (HGB A1C): Hemoglobin A1C: 6.1 % — AB (ref 4.0–5.6)

## 2022-02-06 LAB — GLUCOSE, POCT (MANUAL RESULT ENTRY): POC Glucose: 121 mg/dl — AB (ref 70–99)

## 2022-02-06 NOTE — Progress Notes (Signed)
Established Patient Office Visit  Subjective   Patient ID: Rebekah Salinas, female    DOB: July 26, 1956  Age: 66 y.o. MRN: [id number]  Chief Complaint  Patient presents with   Follow-up    Last visit. Patient will have Medicare in June 2023    HPI  Rebekah Salinas   is a 66 y/o female who has history of Hypertension, Thyroid disease, Voice Tremors, presents for routine follow up, lab review and medication refill.  She states that she is compliant with her medication, denies side effects and continues to make healthy lifestyle changes. Her HgbA1c done during visit was 6.1% and her blood glucose was 121 mg/dl, she continues to adhere to ADA diet. She denies chest pain, palpitation, shortness of breath and vision changes. She will go for Colonoscopy  in June when her Medicare is active, and this will be her last clinic visit . Overall, she states that she's doing well and offers no further complaint.  Review of Systems  Constitutional: Negative.   Eyes: Negative.   Respiratory: Negative.    Cardiovascular: Negative.   Neurological:  Positive for tremors.  Endo/Heme/Allergies: Negative.   Psychiatric/Behavioral: Negative.       Objective:     BP 129/85 (BP Location: Right Arm, Patient Position: Sitting, Cuff Size: Large)   Pulse 85   Temp 98.3 F (36.8 C) (Oral)   Resp 16   Ht _0  (1.651 m)   Wt 214 lb 12.8 oz (97.4 kg)   SpO2 97%   BMI 35.74 kg/m  BP Readings from Last 3 Encounters:  02/06/22 129/85  11/19/21 (!) 149/82  11/13/21 (!) 147/83   Wt Readings from Last 3 Encounters:  02/06/22 214 lb 12.8 oz (97.4 kg)  11/19/21 211 lb 9.6 oz (96 kg)  11/13/21 210 lb 1.6 oz (95.3 kg)    Encouraged weight loss  Physical Exam HENT:     Head: Normocephalic and atraumatic.  Eyes:     Extraocular Movements: Extraocular movements intact.     Conjunctiva/sclera: Conjunctivae normal.     Pupils: Pupils are equal, round, and reactive to light.  Cardiovascular:     Rate and  Rhythm: Normal rate and regular rhythm.     Pulses: Normal pulses.     Heart sounds: Normal heart sounds.  Pulmonary:     Effort: Pulmonary effort is normal.     Breath sounds: Normal breath sounds.  Skin:    General: Skin is warm.  Neurological:     General: No focal deficit present.     Mental Status: She is alert and oriented to person, place, and time. Mental status is at baseline.  Psychiatric:        Mood and Affect: Mood normal.        Behavior: Behavior normal.        Thought Content: Thought content normal.        Judgment: Judgment normal.     Results for orders placed or performed in visit on 02/06/22  POCT HgB A1C  Result Value Ref Range   Hemoglobin A1C 6.1 (A) 4.0 - 5.6 %   HbA1c POC (<> result, manual entry)     HbA1c, POC (prediabetic range)     HbA1c, POC (controlled diabetic range)    POCT Glucose (CBG)  Result Value Ref Range   POC Glucose 121 (A) 70 - 99 mg/dl    Last CBC Lab Results  Component Value Date   WBC 5.0 11/13/2021   HGB 12.7  11/13/2021   HCT 39.1 11/13/2021   MCV 83 11/13/2021   MCH 27.0 11/13/2021   RDW 14.1 11/13/2021   PLT 371 02/20/5614   Last metabolic panel Lab Results  Component Value Date   GLUCOSE 93 11/13/2021   NA 143 11/13/2021   K 4.1 11/13/2021   CL 103 11/13/2021   CO2 25 11/13/2021   BUN 9 11/13/2021   CREATININE 1.07 (H) 11/13/2021   EGFR 58 (L) 11/13/2021   CALCIUM 9.2 11/13/2021   PROT 6.8 11/13/2021   ALBUMIN 4.2 11/13/2021   LABGLOB 2.6 11/13/2021   AGRATIO 1.6 11/13/2021   BILITOT 0.4 11/13/2021   ALKPHOS 117 11/13/2021   AST 17 11/13/2021   ALT 12 11/13/2021   ANIONGAP 9 10/13/2019   Last lipids Lab Results  Component Value Date   CHOL 150 11/13/2021   HDL 42 11/13/2021   LDLCALC 92 11/13/2021   TRIG 84 11/13/2021   CHOLHDL 3.6 11/13/2021   Last hemoglobin A1c Lab Results  Component Value Date   HGBA1C 6.1 (A) 02/06/2022   Last thyroid functions Lab Results  Component Value Date    TSH 1.730 11/13/2021   Last vitamin D No results found for: 25OHVITD2, 25OHVITD3, VD25OH    The 10-year ASCVD risk score (Arnett DK, et al., 2019) is: 7.4%    Assessment & Plan:   1. Prediabetes - Her HgbA1c was 6.1%, she will continue on current medication, encouraged on low carb/non concentrated sweet diet and exercise as tolerated. - POCT Glucose (CBG); Future - POCT HgB A1C; Future - POCT HgB A1C - POCT Glucose (CBG)  2. Benign head tremor - She will continue on current medication, and follow up with Neurology.   Return if symptoms worsen or fail to improve. She has no follow up visit because her Medicare will be active June 1st. [facility] wishes her well with her care.   Reign Bartnick Jerold Coombe, NP

## 2022-02-07 ENCOUNTER — Other Ambulatory Visit: Payer: Self-pay

## 2022-02-11 ENCOUNTER — Other Ambulatory Visit: Payer: Self-pay

## 2022-02-12 ENCOUNTER — Other Ambulatory Visit: Payer: Self-pay

## 2022-02-12 MED FILL — Cyclobenzaprine HCl Tab 10 MG: ORAL | 30 days supply | Qty: 60 | Fill #0 | Status: AC

## 2022-02-14 ENCOUNTER — Other Ambulatory Visit: Payer: Self-pay

## 2022-02-25 ENCOUNTER — Other Ambulatory Visit: Payer: Self-pay | Admitting: Nurse Practitioner

## 2022-02-25 DIAGNOSIS — Z1231 Encounter for screening mammogram for malignant neoplasm of breast: Secondary | ICD-10-CM

## 2022-02-25 DIAGNOSIS — R7303 Prediabetes: Secondary | ICD-10-CM | POA: Diagnosis not present

## 2022-02-25 DIAGNOSIS — M62838 Other muscle spasm: Secondary | ICD-10-CM | POA: Diagnosis not present

## 2022-02-25 DIAGNOSIS — E039 Hypothyroidism, unspecified: Secondary | ICD-10-CM | POA: Diagnosis not present

## 2022-02-25 DIAGNOSIS — E785 Hyperlipidemia, unspecified: Secondary | ICD-10-CM | POA: Diagnosis not present

## 2022-02-25 DIAGNOSIS — I10 Essential (primary) hypertension: Secondary | ICD-10-CM | POA: Diagnosis not present

## 2022-02-27 ENCOUNTER — Other Ambulatory Visit: Payer: Self-pay

## 2022-03-27 DIAGNOSIS — R7303 Prediabetes: Secondary | ICD-10-CM | POA: Diagnosis not present

## 2022-03-27 DIAGNOSIS — E039 Hypothyroidism, unspecified: Secondary | ICD-10-CM | POA: Diagnosis not present

## 2022-03-27 DIAGNOSIS — I10 Essential (primary) hypertension: Secondary | ICD-10-CM | POA: Diagnosis not present

## 2022-03-27 DIAGNOSIS — E785 Hyperlipidemia, unspecified: Secondary | ICD-10-CM | POA: Diagnosis not present

## 2022-04-01 DIAGNOSIS — I10 Essential (primary) hypertension: Secondary | ICD-10-CM | POA: Diagnosis not present

## 2022-04-01 DIAGNOSIS — E782 Mixed hyperlipidemia: Secondary | ICD-10-CM | POA: Diagnosis not present

## 2022-04-01 DIAGNOSIS — E785 Hyperlipidemia, unspecified: Secondary | ICD-10-CM | POA: Diagnosis not present

## 2022-04-01 DIAGNOSIS — E039 Hypothyroidism, unspecified: Secondary | ICD-10-CM | POA: Diagnosis not present

## 2022-04-01 DIAGNOSIS — R7303 Prediabetes: Secondary | ICD-10-CM | POA: Diagnosis not present

## 2022-04-11 ENCOUNTER — Ambulatory Visit
Admission: RE | Admit: 2022-04-11 | Discharge: 2022-04-11 | Disposition: A | Discharge status: Home / Self Care | Payer: Medicare HMO | Source: Ambulatory Visit | Attending: Nurse Practitioner | Admitting: Nurse Practitioner

## 2022-04-11 DIAGNOSIS — Z1231 Encounter for screening mammogram for malignant neoplasm of breast: Secondary | ICD-10-CM | POA: Diagnosis not present

## 2022-04-14 ENCOUNTER — Other Ambulatory Visit: Payer: Self-pay | Admitting: Nurse Practitioner

## 2022-04-14 DIAGNOSIS — R928 Other abnormal and inconclusive findings on diagnostic imaging of breast: Secondary | ICD-10-CM

## 2022-04-14 DIAGNOSIS — N6489 Other specified disorders of breast: Secondary | ICD-10-CM

## 2022-04-24 ENCOUNTER — Ambulatory Visit
Admission: RE | Admit: 2022-04-24 | Discharge: 2022-04-24 | Disposition: A | Discharge status: Home / Self Care | Payer: No Typology Code available for payment source | Source: Ambulatory Visit | Attending: Nurse Practitioner | Admitting: Nurse Practitioner

## 2022-04-24 DIAGNOSIS — R928 Other abnormal and inconclusive findings on diagnostic imaging of breast: Secondary | ICD-10-CM

## 2022-04-24 DIAGNOSIS — N6489 Other specified disorders of breast: Secondary | ICD-10-CM | POA: Insufficient documentation

## 2022-04-25 ENCOUNTER — Other Ambulatory Visit: Payer: Self-pay | Admitting: Gerontology

## 2022-04-25 DIAGNOSIS — Z8639 Personal history of other endocrine, nutritional and metabolic disease: Secondary | ICD-10-CM

## 2022-05-24 ENCOUNTER — Other Ambulatory Visit: Payer: Self-pay | Admitting: Gerontology

## 2022-05-24 DIAGNOSIS — G250 Essential tremor: Secondary | ICD-10-CM

## 2022-05-24 DIAGNOSIS — I10 Essential (primary) hypertension: Secondary | ICD-10-CM

## 2022-05-26 ENCOUNTER — Other Ambulatory Visit: Payer: Self-pay | Admitting: Gerontology

## 2022-05-26 DIAGNOSIS — Z8719 Personal history of other diseases of the digestive system: Secondary | ICD-10-CM

## 2022-06-05 ENCOUNTER — Other Ambulatory Visit: Payer: Self-pay | Admitting: Gerontology

## 2022-06-05 DIAGNOSIS — E039 Hypothyroidism, unspecified: Secondary | ICD-10-CM

## 2022-10-12 IMAGING — CR DG SHOULDER 2+V*L*
3 series · 3 of 3 positions shown · non-contrast
Comparison: None.

CLINICAL DATA: Left shoulder pain

EXAM:
LEFT SHOULDER - 2+ VIEW

[shoulder grashey]
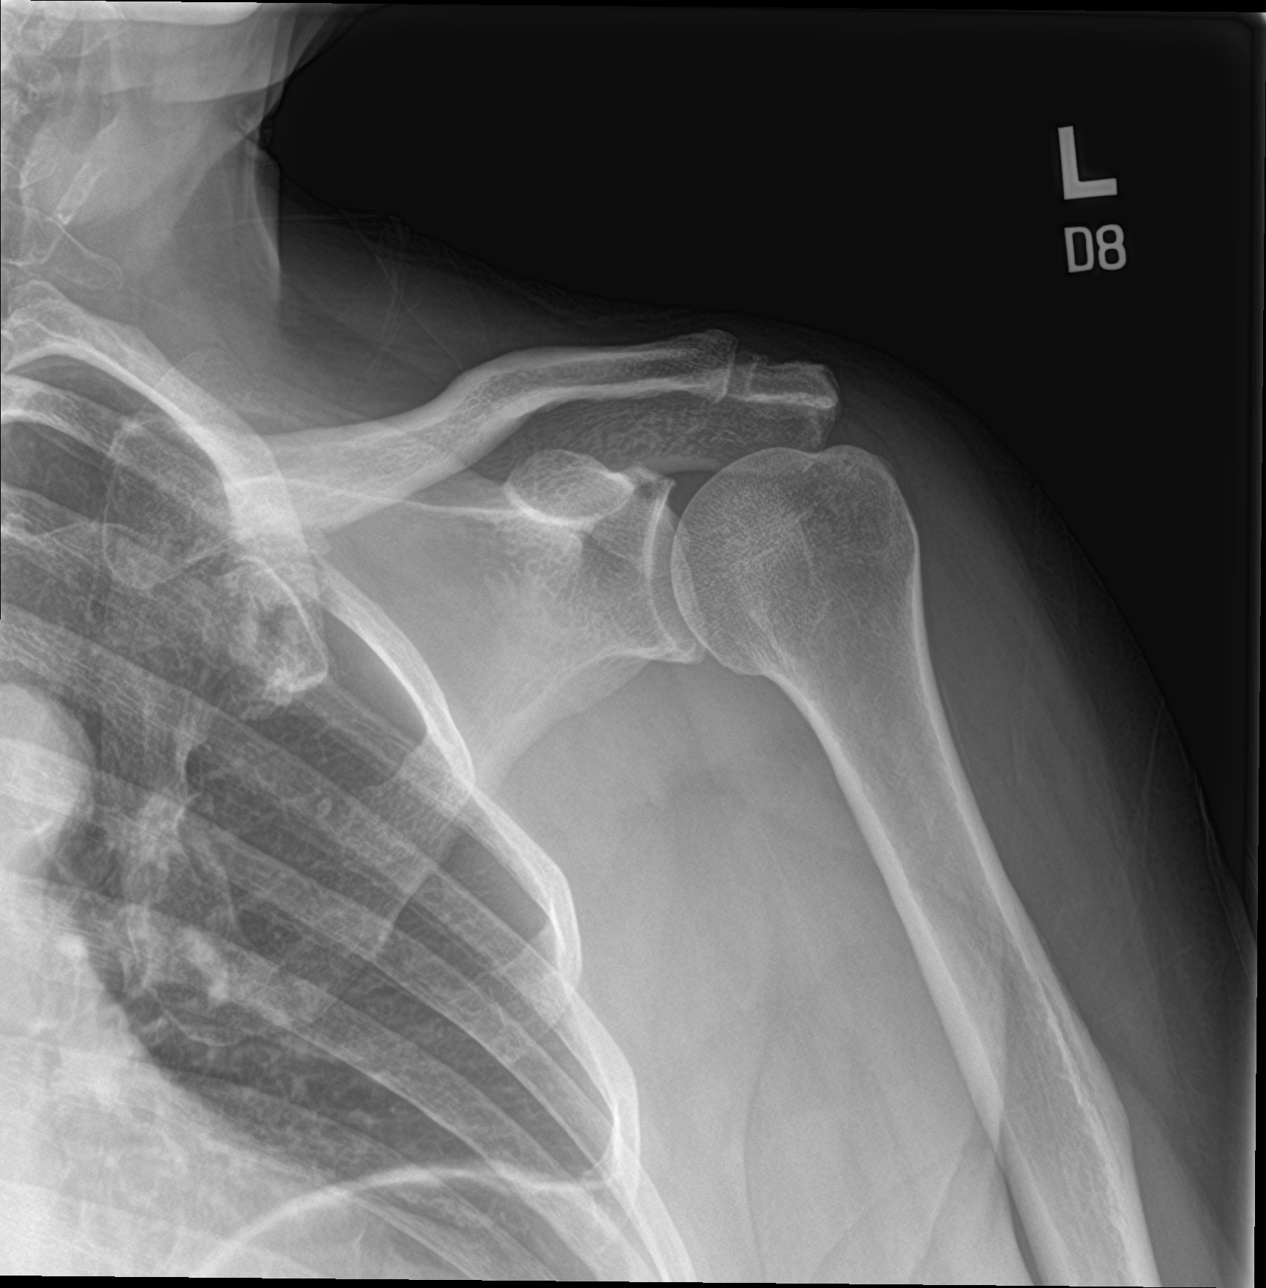

[shoulder y view]
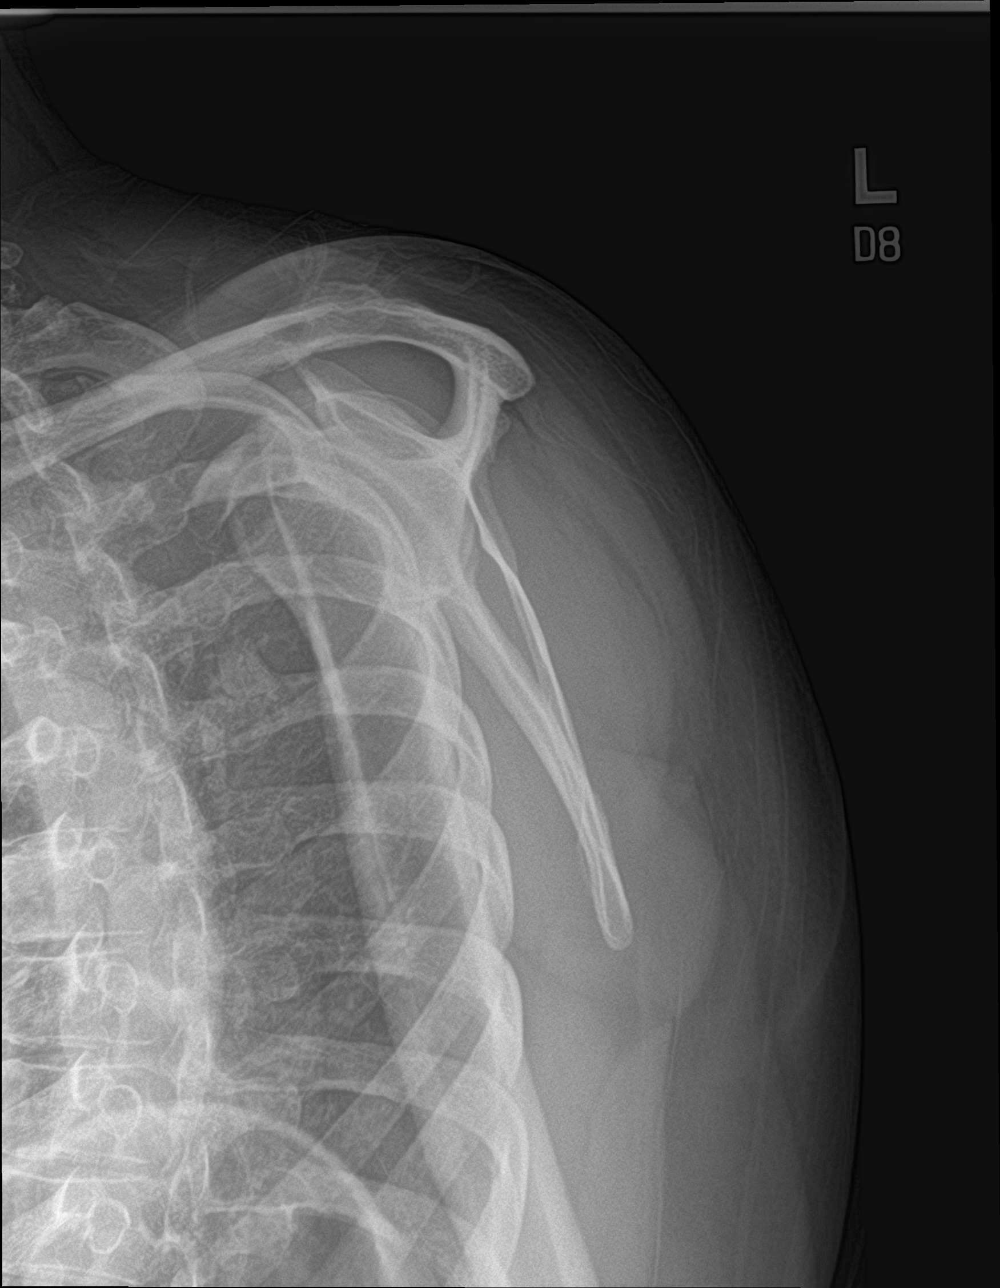

[shoulder axillary]
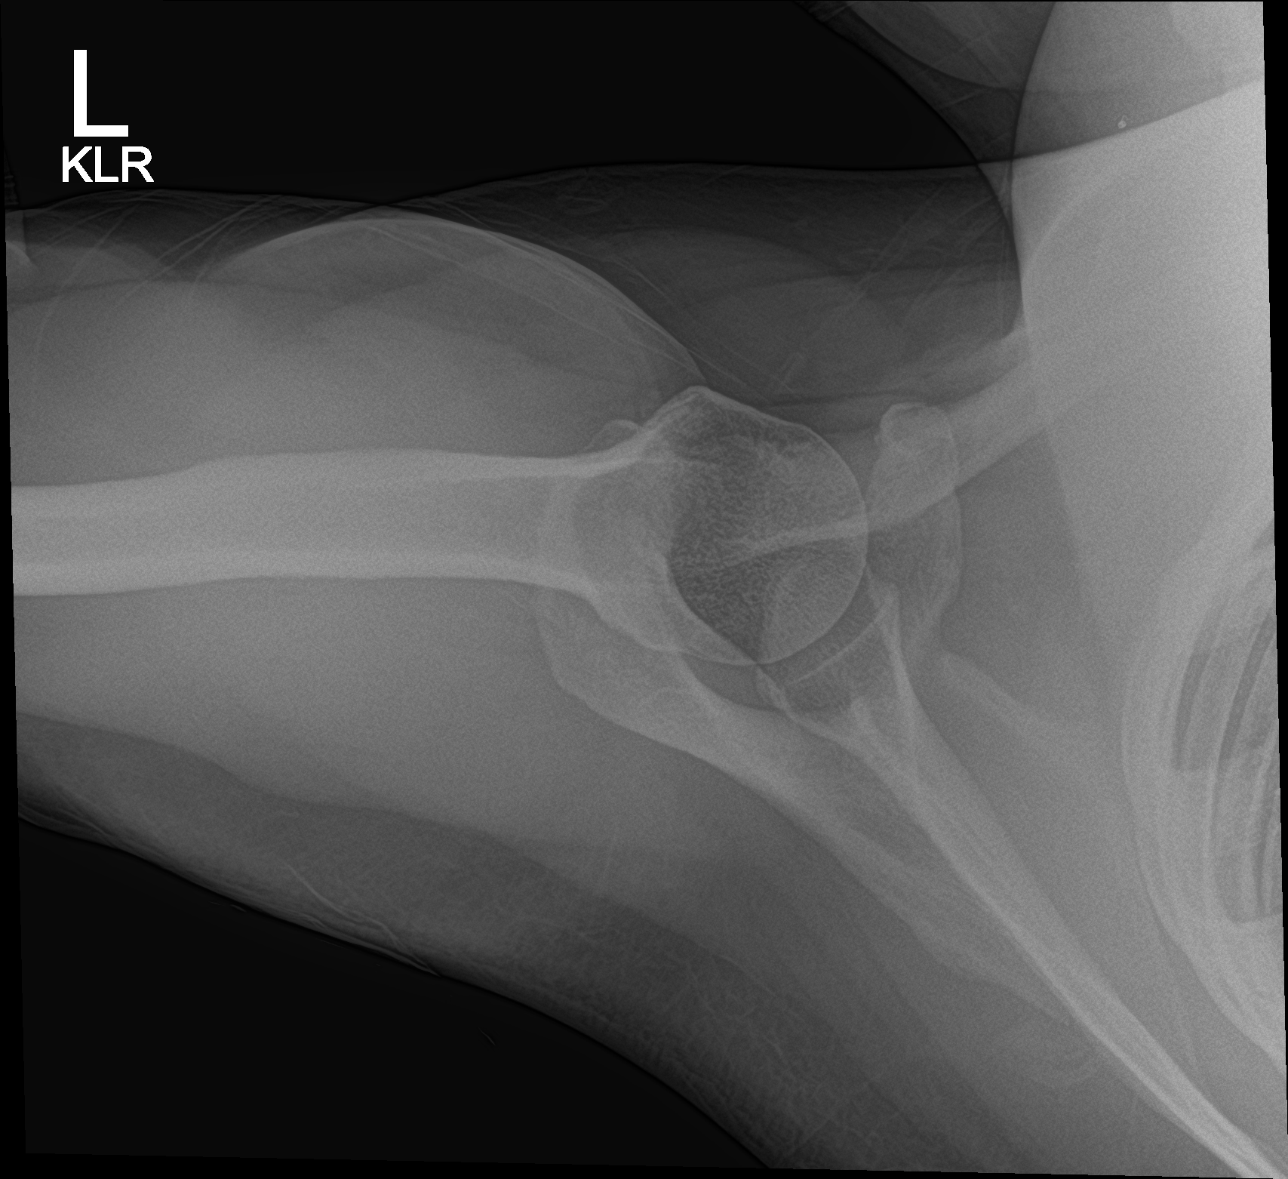

[3 of 3 positions shown; findings below may reference images not displayed]

FINDINGS: There is no evidence of fracture or dislocation. There is no
evidence of arthropathy or other focal bone abnormality. Soft
tissues are unremarkable.
IMPRESSION: Negative.

## 2022-10-12 IMAGING — CR DG CERVICAL SPINE COMPLETE 4+V
7 series · 7 of 7 positions shown · non-contrast
Comparison: 09/24/2017

CLINICAL DATA: Radiating neck pain

EXAM:
CERVICAL SPINE - COMPLETE 4+ VIEW

[c-spine lat]
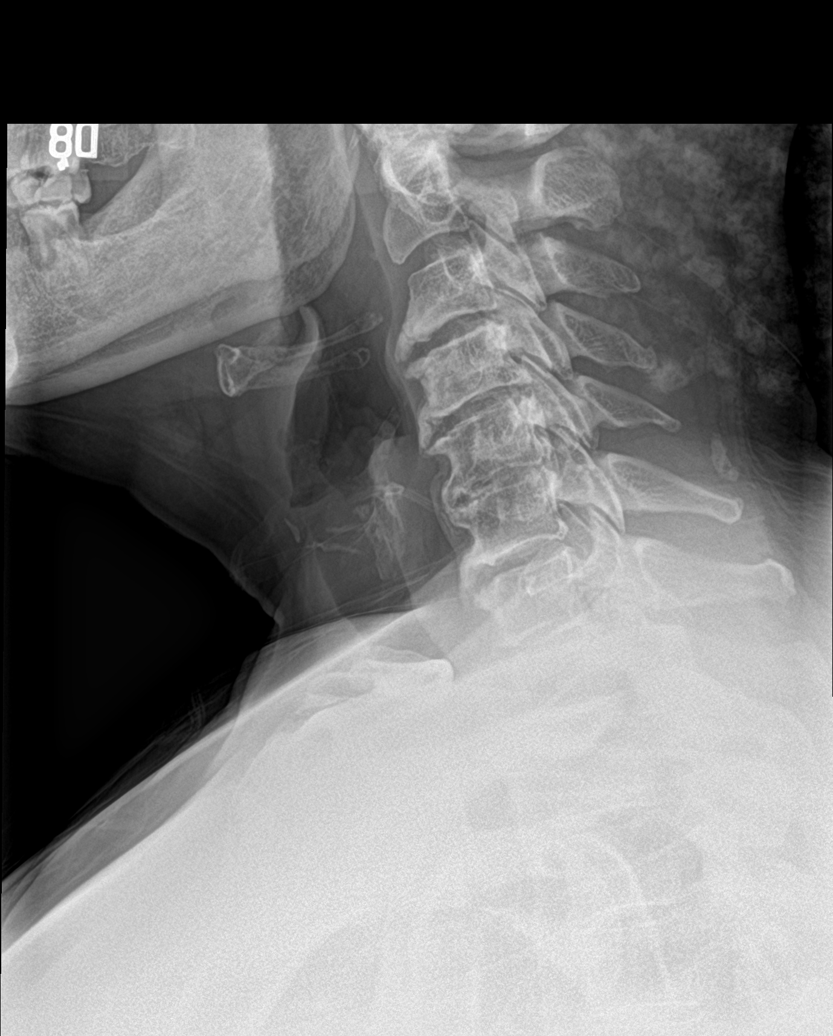

[c-spine obl (1 of 2)]
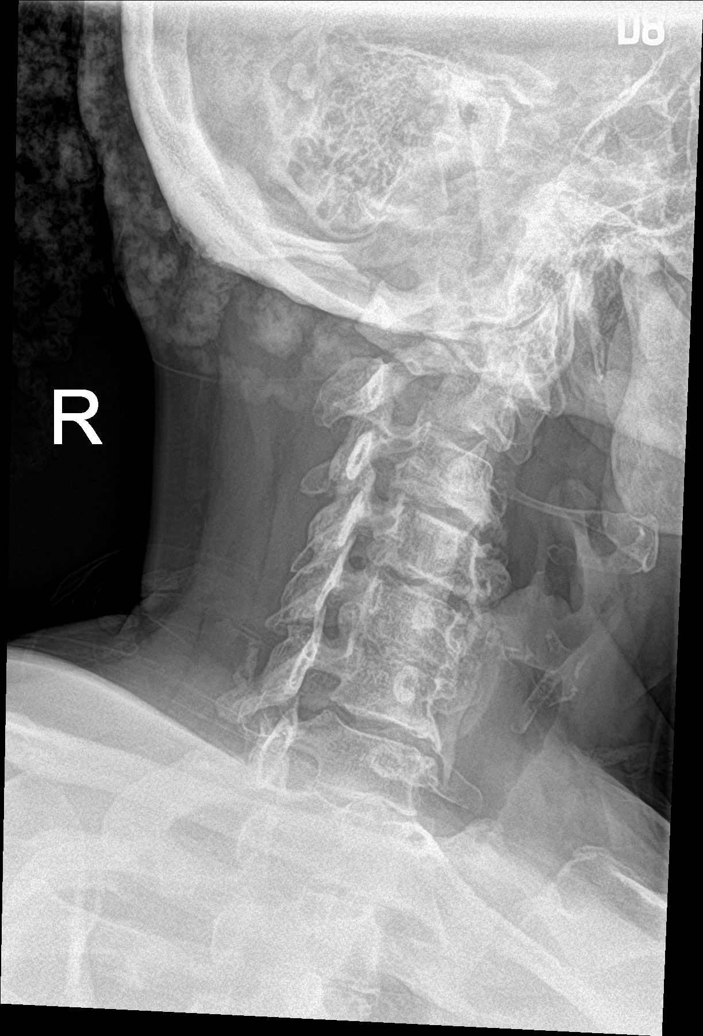

[c-spine obl (2 of 2)]
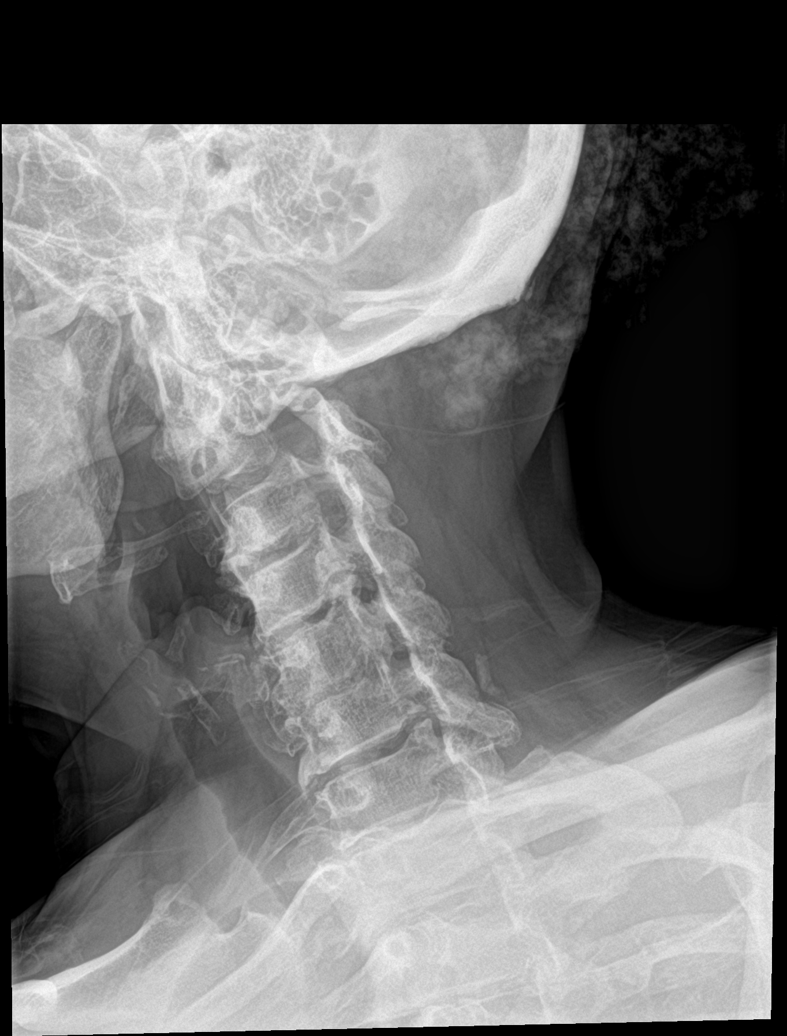

[c-spine ap]
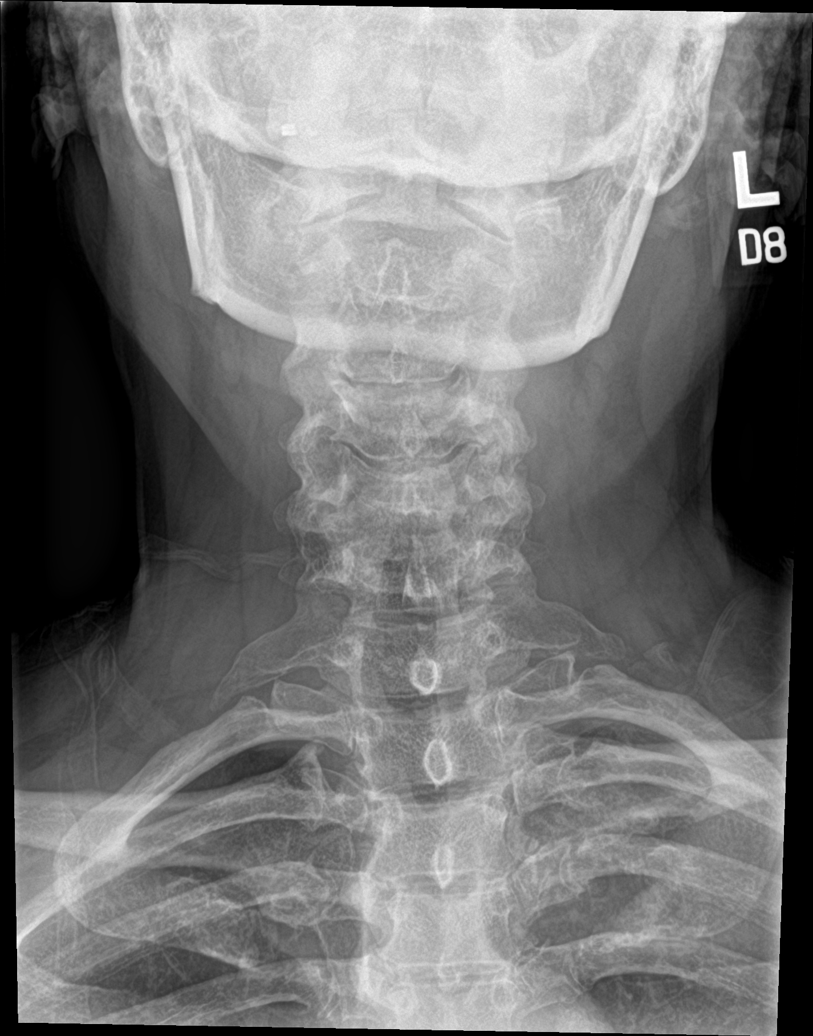

[c-spine open mouth]
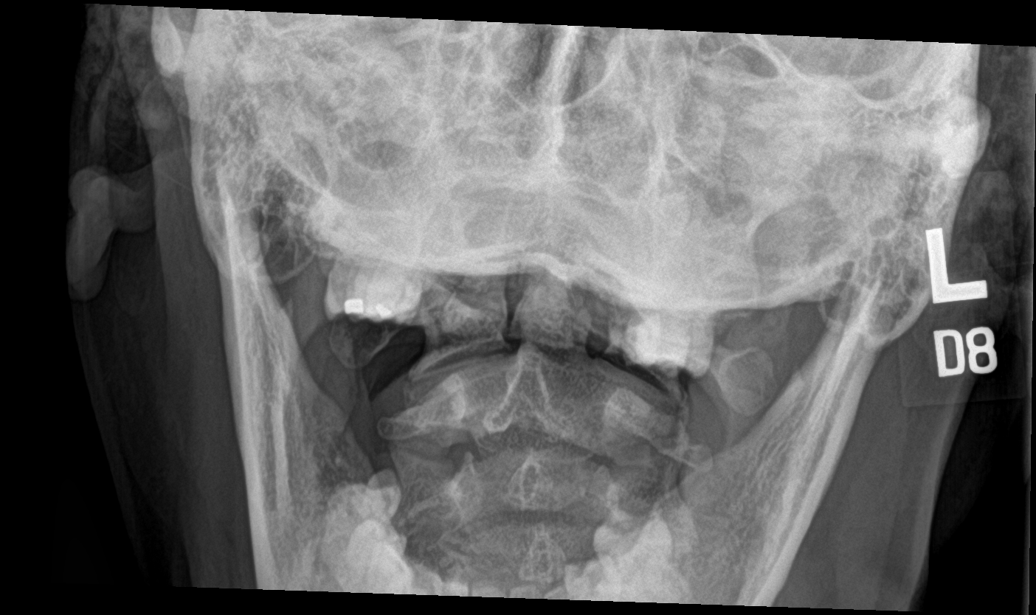

[[person_name]]
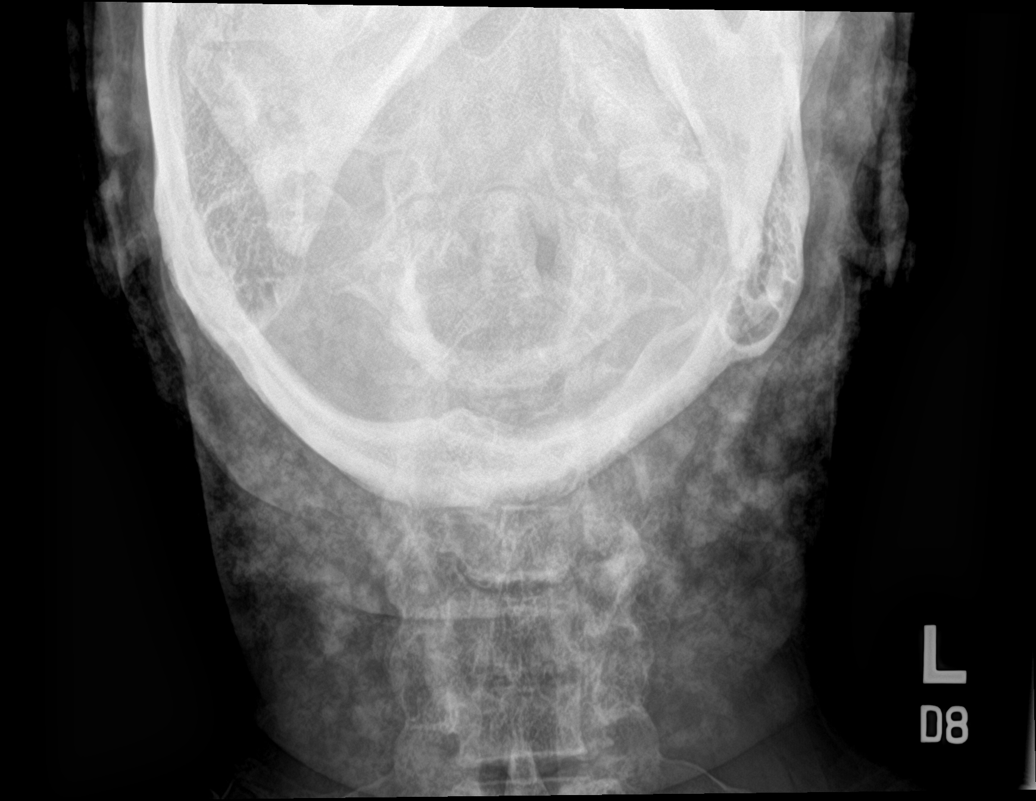

[c-spine swimmers]
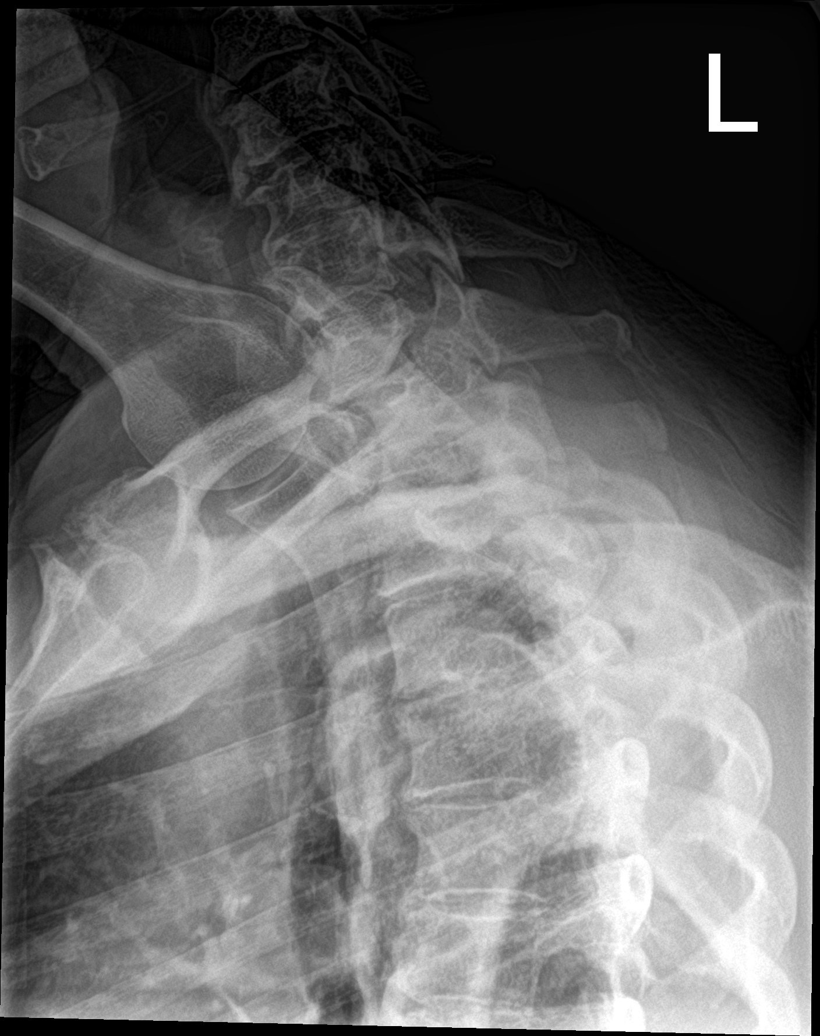

[7 of 7 positions shown; findings below may reference images not displayed]

FINDINGS: There is no evidence of cervical spine fracture or prevertebral soft
tissue swelling. Straightening of the cervical lordosis without
static listhesis. Multilevel intervertebral disc height loss most
pronounced at C4-5 and C5-6. Anterior endplate spurring within the
mid to lower cervical spine. Facet and uncovertebral arthropathy
result multilevel bony foraminal narrowing, most pronounced at C4-5,
C5-6, and C6-7 on the left.
IMPRESSION: 1. No acute findings.
2. Moderate multilevel cervical spondylosis with left-sided bony
foraminal narrowing C4-5, C5-6, and C6-7.

## 2022-11-10 ENCOUNTER — Other Ambulatory Visit: Payer: Self-pay | Admitting: Nurse Practitioner

## 2022-11-10 ENCOUNTER — Other Ambulatory Visit: Payer: No Typology Code available for payment source

## 2022-11-11 LAB — LIPID PANEL W/O CHOL/HDL RATIO
Cholesterol, Total: 251 mg/dL — ABNORMAL HIGH (ref 100–199)
HDL: 48 mg/dL (ref >39)
LDL Chol Calc (NIH): 182 mg/dL — ABNORMAL HIGH (ref 0–99)
Triglycerides: 116 mg/dL (ref 0–149)
VLDL Cholesterol Cal: 21 mg/dL (ref 5–40)

## 2022-11-11 LAB — COMPREHENSIVE METABOLIC PANEL
ALT: 15 IU/L (ref 0–32)
AST: 14 IU/L (ref 0–40)
Albumin/Globulin Ratio: 1.8 (ref 1.2–2.2)
Albumin: 4.3 g/dL (ref 3.9–4.9)
Alkaline Phosphatase: 108 IU/L (ref 44–121)
BUN/Creatinine Ratio: 12 (ref 12–28)
BUN: 13 mg/dL (ref 8–27)
Bilirubin Total: 0.4 mg/dL (ref 0.0–1.2)
CO2: 24 mmol/L (ref 20–29)
Calcium: 9.2 mg/dL (ref 8.7–10.3)
Chloride: 102 mmol/L (ref 96–106)
Creatinine, Ser: 1.06 mg/dL — ABNORMAL HIGH (ref 0.57–1.00)
Globulin, Total: 2.4 g/dL (ref 1.5–4.5)
Glucose: 95 mg/dL (ref 70–99)
Potassium: 4.3 mmol/L (ref 3.5–5.2)
Sodium: 140 mmol/L (ref 134–144)
Total Protein: 6.7 g/dL (ref 6.0–8.5)
eGFR: 58 mL/min/{1.73_m2} — ABNORMAL LOW (ref >59)

## 2022-11-11 LAB — TSH: TSH: 3.15 u[IU]/mL (ref 0.450–4.500)

## 2022-11-11 LAB — HGB A1C W/O EAG: Hgb A1c MFr Bld: 6 % — ABNORMAL HIGH (ref 4.8–5.6)

## 2022-11-14 ENCOUNTER — Encounter: Payer: Self-pay | Admitting: Nurse Practitioner

## 2022-11-14 ENCOUNTER — Ambulatory Visit (INDEPENDENT_AMBULATORY_CARE_PROVIDER_SITE_OTHER): Payer: No Typology Code available for payment source | Admitting: Nurse Practitioner

## 2022-11-14 VITALS — BP 136/72 | HR 74 | Ht 64.0 in | Wt 208.0 lb

## 2022-11-14 DIAGNOSIS — I10 Essential (primary) hypertension: Secondary | ICD-10-CM | POA: Diagnosis not present

## 2022-11-14 DIAGNOSIS — G250 Essential tremor: Secondary | ICD-10-CM

## 2022-11-14 DIAGNOSIS — M542 Cervicalgia: Secondary | ICD-10-CM

## 2022-11-14 DIAGNOSIS — E039 Hypothyroidism, unspecified: Secondary | ICD-10-CM

## 2022-11-14 DIAGNOSIS — E782 Mixed hyperlipidemia: Secondary | ICD-10-CM

## 2022-11-14 DIAGNOSIS — Z8719 Personal history of other diseases of the digestive system: Secondary | ICD-10-CM

## 2022-11-14 DIAGNOSIS — M545 Low back pain, unspecified: Secondary | ICD-10-CM

## 2022-11-14 DIAGNOSIS — R7303 Prediabetes: Secondary | ICD-10-CM

## 2022-11-14 DIAGNOSIS — Z8639 Personal history of other endocrine, nutritional and metabolic disease: Secondary | ICD-10-CM

## 2022-11-14 DIAGNOSIS — M25512 Pain in left shoulder: Secondary | ICD-10-CM

## 2022-11-14 DIAGNOSIS — G8929 Other chronic pain: Secondary | ICD-10-CM

## 2022-11-14 MED ORDER — PRIMIDONE 50 MG PO TABS: ORAL_TABLET | Freq: Two times a day (BID) | ORAL | 3 refills | Status: DC

## 2022-11-14 MED ORDER — ATORVASTATIN CALCIUM 40 MG PO TABS: 40.0000 mg | ORAL_TABLET | Freq: Every day | ORAL | 3 refills | Status: DC

## 2022-11-14 MED ORDER — CYCLOBENZAPRINE HCL 10 MG PO TABS: ORAL_TABLET | Freq: Two times a day (BID) | ORAL | 3 refills | Status: DC

## 2022-11-14 MED ORDER — LEVOTHYROXINE SODIUM 75 MCG PO TABS: ORAL_TABLET | ORAL | 1 refills | Status: DC

## 2022-11-14 MED ORDER — OMEPRAZOLE 20 MG PO CPDR: 20.0000 mg | DELAYED_RELEASE_CAPSULE | Freq: Every day | ORAL | 3 refills | Status: DC

## 2022-11-14 NOTE — Progress Notes (Signed)
Established Patient Office Visit  Subjective:  Patient ID: Rebekah Salinas, female    DOB: 1956/08/23  Age: 67 y.o. MRN: [id number]  Chief Complaint  Patient presents with   Follow-up    4Months with Labs     Follow up and lab results resulted.  Neg ROS.  Needs refills.  Elevated total cholesterol and LDL but patient ran out of med and pharmacy would not call office for refill.  Thyroid stable.       Past Medical History:  Diagnosis Date   Diabetes mellitus without complication ([place])    Hypertension    Thyroid disease     Social History   Socioeconomic History   Marital status: Married    Spouse name: Not on file   Number of children: Not on file   Years of education: Not on file   Highest education level: Not on file  Occupational History   Not on file  Tobacco Use   Smoking status: Never   Smokeless tobacco: Never  Vaping Use   Vaping Use: Never used  Substance and Sexual Activity   Alcohol use: Never   Drug use: Never   Sexual activity: Yes    Birth control/protection: None  Other Topics Concern   Not on file  Social History Narrative   Not on file   Social Determinants of Health   Financial Resource Strain: Not on file  Food Insecurity: No Food Insecurity (04/03/2021)   Hunger Vital Sign    Worried About Running Out of Food in the Last Year: Never true    Ran Out of Food in the Last Year: Never true  Transportation Needs: No Transportation Needs (04/03/2021)   PRAPARE - Hydrologist (Medical): No    Lack of Transportation (Non-Medical): No  Physical Activity: Not on file  Stress: Not on file  Social Connections: Not on file  Intimate Partner Violence: Not on file    Family History  Problem Relation Age of Onset   Other Mother        murdered   Other Father        unknown medical history   CAD Sister    Breast cancer Sister 56       twin sister   Cancer Sister    Cancer Maternal Grandmother     Allergies   Allergen Reactions   Aspirin Nausea And Vomiting    Review of Systems  Constitutional: Negative.   HENT: Negative.    Eyes: Negative.   Respiratory: Negative.    Cardiovascular: Negative.   Gastrointestinal: Negative.   Genitourinary: Negative.   Musculoskeletal:  Positive for joint pain.  Skin: Negative.   Neurological: Negative.   Endo/Heme/Allergies: Negative.   Psychiatric/Behavioral: Negative.         Objective:   BP 136/72   Pulse 74   Ht '5\' 4"'$  (1.626 m)   Wt 208 lb (94.3 kg)   SpO2 96%   BMI 35.70 kg/m   Vitals:   11/14/22 0856  BP: 136/72  Pulse: 74  Height: '5\' 4"'$  (1.626 m)  Weight: 208 lb (94.3 kg)  SpO2: 96%  BMI (Calculated): 35.69    Physical Exam Vitals reviewed.  Constitutional:      Appearance: Normal appearance.  HENT:     Head: Normocephalic.     Nose: Nose normal.     Mouth/Throat:     Mouth: Mucous membranes are moist.  Eyes:     Pupils: Pupils are  equal, round, and reactive to light.  Cardiovascular:     Rate and Rhythm: Normal rate and regular rhythm.  Pulmonary:     Effort: Pulmonary effort is normal.     Breath sounds: Normal breath sounds.  Abdominal:     General: Bowel sounds are normal.     Palpations: Abdomen is soft.  Musculoskeletal:        General: Tenderness present.     Cervical back: Normal range of motion and neck supple.  Skin:    General: Skin is warm and dry.  Neurological:     Mental Status: She is alert and oriented to person, place, and time.  Psychiatric:        Mood and Affect: Mood normal.        Behavior: Behavior normal.      No results found for any visits on 11/14/22.  Recent Results (from the past 2160 hour(s))  Comprehensive metabolic panel     Status: Abnormal   Collection Time: 11/10/22  9:02 AM  Result Value Ref Range   Glucose 95 70 - 99 mg/dL   BUN 13 8 - 27 mg/dL   Creatinine, Ser 1.06 (H) 0.57 - 1.00 mg/dL   eGFR 58 (L) >59 mL/min/1.73   BUN/Creatinine Ratio 12 12 - 28    Sodium 140 134 - 144 mmol/L   Potassium 4.3 3.5 - 5.2 mmol/L   Chloride 102 96 - 106 mmol/L   CO2 24 20 - 29 mmol/L   Calcium 9.2 8.7 - 10.3 mg/dL   Total Protein 6.7 6.0 - 8.5 g/dL   Albumin 4.3 3.9 - 4.9 g/dL   Globulin, Total 2.4 1.5 - 4.5 g/dL   Albumin/Globulin Ratio 1.8 1.2 - 2.2   Bilirubin Total 0.4 0.0 - 1.2 mg/dL   Alkaline Phosphatase 108 44 - 121 IU/L   AST 14 0 - 40 IU/L   ALT 15 0 - 32 IU/L  Lipid Panel w/o Chol/HDL Ratio     Status: Abnormal   Collection Time: 11/10/22  9:02 AM  Result Value Ref Range   Cholesterol, Total 251 (H) 100 - 199 mg/dL   Triglycerides 116 0 - 149 mg/dL   HDL 48 >39 mg/dL   VLDL Cholesterol Cal 21 5 - 40 mg/dL   LDL Chol Calc (NIH) 182 (H) 0 - 99 mg/dL  Hgb A1c w/o eAG     Status: Abnormal   Collection Time: 11/10/22  9:02 AM  Result Value Ref Range   Hgb A1c MFr Bld 6.0 (H) 4.8 - 5.6 %    Comment:          Prediabetes: 5.7 - 6.4          Diabetes: >6.4          Glycemic control for adults with diabetes: <7.0   TSH     Status: None   Collection Time: 11/10/22  9:02 AM  Result Value Ref Range   TSH 3.150 0.450 - 4.500 uIU/mL      Assessment & Plan:   Problem List Items Addressed This Visit       Cardiovascular and Mediastinum   Essential hypertension - Primary   Relevant Medications   hydrochlorothiazide (MICROZIDE) 12.5 MG capsule   atorvastatin (LIPITOR) 40 MG tablet     Endocrine   Hypothyroidism   Relevant Medications   levothyroxine (SYNTHROID) 75 MCG tablet     Nervous and Auditory   Benign head tremor   Relevant Medications   primidone (MYSOLINE)  50 MG tablet     Other   Prediabetes   History of gastroesophageal reflux (GERD)   Relevant Medications   omeprazole (PRILOSEC) 20 MG capsule   HLD (hyperlipidemia)   Relevant Medications   hydrochlorothiazide (MICROZIDE) 12.5 MG capsule   atorvastatin (LIPITOR) 40 MG tablet   Other Visit Diagnoses     History of elevated lipids       Relevant Medications    atorvastatin (LIPITOR) 40 MG tablet   Neck pain       Relevant Medications   cyclobenzaprine (FLEXERIL) 10 MG tablet   Chronic left shoulder pain       Relevant Medications   meloxicam (MOBIC) 15 MG tablet   primidone (MYSOLINE) 50 MG tablet   cyclobenzaprine (FLEXERIL) 10 MG tablet   Chronic low back pain, unspecified back pain laterality, unspecified whether sciatica present       Relevant Medications   meloxicam (MOBIC) 15 MG tablet   primidone (MYSOLINE) 50 MG tablet   cyclobenzaprine (FLEXERIL) 10 MG tablet       Return in about 4 months (around 03/15/2023).   Total time spent: 30 minutes  Evern Bio, NP  11/14/2022

## 2022-11-14 NOTE — Patient Instructions (Signed)
1) Refills 2) Follow up appt in 4 months, fasting labs prior 3) Encouraged to drink more water but already a good water drinker

## 2023-02-10 ENCOUNTER — Other Ambulatory Visit: Payer: Self-pay

## 2023-02-10 ENCOUNTER — Emergency Department: Payer: No Typology Code available for payment source

## 2023-02-10 ENCOUNTER — Emergency Department
Admission: EM | Admit: 2023-02-10 | Discharge: 2023-02-10 | Disposition: A | Discharge status: Home / Self Care | Payer: No Typology Code available for payment source | Attending: Emergency Medicine | Admitting: Emergency Medicine

## 2023-02-10 ENCOUNTER — Encounter: Payer: Self-pay | Admitting: Intensive Care

## 2023-02-10 DIAGNOSIS — K7689 Other specified diseases of liver: Secondary | ICD-10-CM | POA: Insufficient documentation

## 2023-02-10 DIAGNOSIS — E119 Type 2 diabetes mellitus without complications: Secondary | ICD-10-CM | POA: Diagnosis not present

## 2023-02-10 DIAGNOSIS — I10 Essential (primary) hypertension: Secondary | ICD-10-CM | POA: Insufficient documentation

## 2023-02-10 DIAGNOSIS — N12 Tubulo-interstitial nephritis, not specified as acute or chronic: Secondary | ICD-10-CM | POA: Diagnosis not present

## 2023-02-10 DIAGNOSIS — R109 Unspecified abdominal pain: Secondary | ICD-10-CM | POA: Diagnosis present

## 2023-02-10 LAB — COMPREHENSIVE METABOLIC PANEL
ALT: 14 U/L (ref 0–44)
AST: 17 U/L (ref 15–41)
Albumin: 3.8 g/dL (ref 3.5–5.0)
Alkaline Phosphatase: 93 U/L (ref 38–126)
Anion gap: 9 (ref 5–15)
BUN: 15 mg/dL (ref 8–23)
CO2: 24 mmol/L (ref 22–32)
Calcium: 9.1 mg/dL (ref 8.9–10.3)
Chloride: 104 mmol/L (ref 98–111)
Creatinine, Ser: 1.08 mg/dL — ABNORMAL HIGH (ref 0.44–1.00)
GFR, Estimated: 57 mL/min — ABNORMAL LOW (ref >60)
Glucose, Bld: 91 mg/dL (ref 70–99)
Potassium: 3.9 mmol/L (ref 3.5–5.1)
Sodium: 137 mmol/L (ref 135–145)
Total Bilirubin: 1.2 mg/dL (ref 0.3–1.2)
Total Protein: 7.5 g/dL (ref 6.5–8.1)

## 2023-02-10 LAB — URINALYSIS, ROUTINE W REFLEX MICROSCOPIC
Bilirubin Urine: NEGATIVE
Glucose, UA: NEGATIVE mg/dL
Ketones, ur: NEGATIVE mg/dL
Nitrite: NEGATIVE
Protein, ur: 30 mg/dL — AB
Specific Gravity, Urine: 1.004 — ABNORMAL LOW (ref 1.005–1.030)
pH: 5 (ref 5.0–8.0)

## 2023-02-10 LAB — CBC
HCT: 41.6 % (ref 36.0–46.0)
Hemoglobin: 13.1 g/dL (ref 12.0–15.0)
MCH: 27.6 pg (ref 26.0–34.0)
MCHC: 31.5 g/dL (ref 30.0–36.0)
MCV: 87.8 fL (ref 80.0–100.0)
Platelets: 354 10*3/uL (ref 150–400)
RBC: 4.74 MIL/uL (ref 3.87–5.11)
RDW: 13.5 % (ref 11.5–15.5)
WBC: 7.4 10*3/uL (ref 4.0–10.5)
nRBC: 0 % (ref 0.0–0.2)

## 2023-02-10 LAB — LIPASE, BLOOD: Lipase: 32 U/L (ref 11–51)

## 2023-02-10 MED ORDER — KETOROLAC TROMETHAMINE 15 MG/ML IJ SOLN
15.0000 mg | Freq: Once | INTRAMUSCULAR | Status: AC
  Administered 2023-02-10: 15 mg via INTRAVENOUS
  Filled 2023-02-10: qty 1

## 2023-02-10 MED ORDER — SODIUM CHLORIDE 0.9 % IV BOLUS
500.0000 mL | Freq: Once | INTRAVENOUS | Status: AC
  Administered 2023-02-10: 500 mL via INTRAVENOUS

## 2023-02-10 MED ORDER — ONDANSETRON 4 MG PO TBDP: 4.0000 mg | ORAL_TABLET | Freq: Three times a day (TID) | ORAL | 0 refills | Status: DC | PRN

## 2023-02-10 MED ORDER — SODIUM CHLORIDE 0.9 % IV SOLN
1.0000 g | Freq: Once | INTRAVENOUS | Status: AC
  Administered 2023-02-10: 1 g via INTRAVENOUS
  Filled 2023-02-10: qty 10

## 2023-02-10 MED ORDER — CEFPODOXIME PROXETIL 200 MG PO TABS: 200.0000 mg | ORAL_TABLET | Freq: Two times a day (BID) | ORAL | 0 refills | Status: AC

## 2023-02-10 NOTE — ED Triage Notes (Signed)
Patient c/o lower abdominal pain that wraps around to left backside. C/o burning during urination. Symptoms started this AM.   Denies fevers. Denies N/V/D

## 2023-02-10 NOTE — ED Provider Notes (Signed)
[facility] Provider Note    Event Date/Time   First MD Initiated Contact with Patient 02/10/23 1624     (approximate)   History   Abdominal Pain   HPI  Rebekah Salinas is a 67 y.o. female past medical history significant for hypertension, diabetes, thyroid disease, who presents to the emergency department with abdominal pain.  This morning started having left-sided abdominal pain and flank pain.  Currently rates her pain as 8/10.  Has not take anything for the pain.  Associated with dysuria, urinary urgency and frequency.  Denies any nausea or vomiting.  No fever or chills.  No prior abdominal surgeries.  No history of kidney stone.  No falls or trauma.  Not on anticoagulation.  Denies any diarrhea or blood in her stool.     Physical Exam   Triage Vital Signs: ED Triage Vitals  Enc Vitals Group     BP 02/10/23 1534 (!) 164/87     Pulse Rate 02/10/23 1534 85     Resp 02/10/23 1534 16     Temp 02/10/23 1534 99.2 F (37.3 C)     Temp Source 02/10/23 1534 Oral     SpO2 02/10/23 1534 98 %     Weight 02/10/23 1536 205 lb (93 kg)     Height 02/10/23 1536 5\' 4"  (1.626 m)     Head Circumference --      Peak Flow --      Pain Score 02/10/23 1536 8     Pain Loc --      Pain Edu? --      Excl. in GC? --     Most recent vital signs: Vitals:   02/10/23 1534 02/10/23 1914  BP: (!) 164/87 127/68  Pulse: 85 84  Resp: 16 16  Temp: 99.2 F (37.3 C) 98.2 F (36.8 C)  SpO2: 98% 96%    Physical Exam Constitutional:      General: She is in acute distress.     Appearance: She is well-developed.  HENT:     Head: Atraumatic.  Eyes:     Conjunctiva/sclera: Conjunctivae normal.  Cardiovascular:     Rate and Rhythm: Regular rhythm.  Pulmonary:     Effort: No respiratory distress.  Abdominal:     General: There is no distension.     Tenderness: There is abdominal tenderness in the left lower quadrant. There is left CVA tenderness.  Musculoskeletal:         General: Normal range of motion.     Cervical back: Normal range of motion.  Skin:    General: Skin is warm.  Neurological:     Mental Status: She is alert. Mental status is at baseline.     IMPRESSION / MDM / ASSESSMENT AND PLAN / ED COURSE  I reviewed the triage vital signs and the nursing notes.  Differential diagnosis including pyelonephritis, infected kidney stone, kidney stone, diverticulitis, bowel obstruction    RADIOLOGY I independently reviewed imaging, my interpretation of imaging: CT scan without contrast -no signs of a kidney stone.  Did note some hydronephrosis.  Read as mild left-sided hydronephrosis with perinephric stranding but no signs of an obstructing lesion.  Also noted incidentally liver lesion and recommended outpatient follow-up/MRI versus ultrasound  LABS (all labs ordered are listed, but only abnormal results are displayed) Labs interpreted as -    Labs Reviewed  COMPREHENSIVE METABOLIC PANEL - Abnormal; Notable for the following components:      Result Value  Creatinine, Ser 1.08 (*)    GFR, Estimated 57 (*)    All other components within normal limits  URINALYSIS, ROUTINE W REFLEX MICROSCOPIC - Abnormal; Notable for the following components:   Color, Urine STRAW (*)    APPearance HAZY (*)    Specific Gravity, Urine 1.004 (*)    Hgb urine dipstick MODERATE (*)    Protein, ur 30 (*)    Leukocytes,Ua SMALL (*)    Bacteria, UA MANY (*)    All other components within normal limits  URINE CULTURE  LIPASE, BLOOD  CBC     MDM  No leukocytosis, creatinine at baseline with no significant electrolyte abnormalities.  Urine concerning for urinary tract infection.  Urine culture was added on.  On chart review no history of a resistant urinary tract infection  Treated with IV fluids, IV ketorolac, IV Rocephin   CT scan without findings of kidney stone or an obstructing process.  Concern for left-sided pyelonephritis.  On reevaluation patient  states that her pain is significantly improved.  Tolerating p.o.  Will start the patient on antibiotics.  Given return precaution for any ongoing fever or vomiting and unable to tolerate her antibiotics.  Discussed incidental findings on her CT scan.  Urine was sent for culture.   PROCEDURES:  Critical Care performed: No  Procedures  Patient's presentation is most consistent with acute presentation with potential threat to life or bodily function.   MEDICATIONS ORDERED IN ED: Medications  sodium chloride 0.9 % bolus 500 mL (0 mLs Intravenous Stopped 02/10/23 1908)  ketorolac (TORADOL) 15 MG/ML injection 15 mg (15 mg Intravenous Given 02/10/23 1707)  cefTRIAXone (ROCEPHIN) 1 g in sodium chloride 0.9 % 100 mL IVPB (0 g Intravenous Stopped 02/10/23 1909)    FINAL CLINICAL IMPRESSION(S) / ED DIAGNOSES   Final diagnoses:  Pyelonephritis  Liver cyst     Rx / DC Orders   ED Discharge Orders          Ordered    cefpodoxime (VANTIN) 200 MG tablet  2 times daily        02/10/23 1847    ondansetron (ZOFRAN-ODT) 4 MG disintegrating tablet  Every 8 hours PRN        02/10/23 1847             Note:  This document was prepared using Dragon voice recognition software and may include unintentional dictation errors.   Corena Herter, MD 02/10/23 2348

## 2023-02-10 NOTE — Discharge Instructions (Addendum)
You were seen in the emergency department for left-sided abdominal pain.  You are diagnosed with a left-sided kidney infection.  You had a CT scan done that showed inflammation of your left kidney.  No obvious signs of a kidney stone.  You are given an antibiotic in the emergency department and started on an antibiotic.  Take your next dose of antibiotics tomorrow morning.  You can alternate Motrin and Tylenol for pain control.  Call your primary care physician in the morning to schedule close follow-up appointment.  Return to the emergency department for fever, vomiting or worsening pain.  You had an incidental finding on your CT scan of a complex cyst near your liver/pancreas.  It was recommended that you get this worked up further with an outpatient MRI or ultrasound.  Discussed this with your primary care provider and they can help schedule this for you.

## 2023-02-11 LAB — URINE CULTURE

## 2023-02-12 LAB — URINE CULTURE: Culture: >=100000 — AB

## 2023-03-12 ENCOUNTER — Other Ambulatory Visit: Payer: No Typology Code available for payment source

## 2023-03-12 DIAGNOSIS — E039 Hypothyroidism, unspecified: Secondary | ICD-10-CM

## 2023-03-12 DIAGNOSIS — Z8639 Personal history of other endocrine, nutritional and metabolic disease: Secondary | ICD-10-CM

## 2023-03-12 DIAGNOSIS — I10 Essential (primary) hypertension: Secondary | ICD-10-CM

## 2023-03-12 DIAGNOSIS — R7303 Prediabetes: Secondary | ICD-10-CM

## 2023-03-13 LAB — CMP14+EGFR
ALT: 14 IU/L (ref 0–32)
AST: 21 IU/L (ref 0–40)
Albumin: 4.1 g/dL (ref 3.9–4.9)
Alkaline Phosphatase: 111 IU/L (ref 44–121)
BUN/Creatinine Ratio: 13 (ref 12–28)
BUN: 15 mg/dL (ref 8–27)
Bilirubin Total: 0.4 mg/dL (ref 0.0–1.2)
CO2: 24 mmol/L (ref 20–29)
Calcium: 9.2 mg/dL (ref 8.7–10.3)
Chloride: 104 mmol/L (ref 96–106)
Creatinine, Ser: 1.13 mg/dL — ABNORMAL HIGH (ref 0.57–1.00)
Globulin, Total: 2.4 g/dL (ref 1.5–4.5)
Glucose: 89 mg/dL (ref 70–99)
Potassium: 4.6 mmol/L (ref 3.5–5.2)
Sodium: 141 mmol/L (ref 134–144)
Total Protein: 6.5 g/dL (ref 6.0–8.5)
eGFR: 54 mL/min/{1.73_m2} — ABNORMAL LOW (ref >59)

## 2023-03-13 LAB — LIPID PANEL
Chol/HDL Ratio: 3 ratio (ref 0.0–4.4)
Cholesterol, Total: 137 mg/dL (ref 100–199)
HDL: 45 mg/dL (ref >39)
LDL Chol Calc (NIH): 78 mg/dL (ref 0–99)
Triglycerides: 69 mg/dL (ref 0–149)
VLDL Cholesterol Cal: 14 mg/dL (ref 5–40)

## 2023-03-13 LAB — HEMOGLOBIN A1C
Est. average glucose Bld gHb Est-mCnc: 128 mg/dL
Hgb A1c MFr Bld: 6.1 % — ABNORMAL HIGH (ref 4.8–5.6)

## 2023-03-13 LAB — TSH: TSH: 2.96 u[IU]/mL (ref 0.450–4.500)

## 2023-03-16 ENCOUNTER — Ambulatory Visit: Payer: No Typology Code available for payment source | Admitting: Nurse Practitioner

## 2023-04-07 ENCOUNTER — Ambulatory Visit (INDEPENDENT_AMBULATORY_CARE_PROVIDER_SITE_OTHER): Payer: No Typology Code available for payment source | Admitting: Cardiology

## 2023-04-07 ENCOUNTER — Encounter: Payer: Self-pay | Admitting: Cardiology

## 2023-04-07 VITALS — BP 124/86 | HR 79 | Ht 64.0 in | Wt 210.4 lb

## 2023-04-07 DIAGNOSIS — R300 Dysuria: Secondary | ICD-10-CM

## 2023-04-07 DIAGNOSIS — R7303 Prediabetes: Secondary | ICD-10-CM | POA: Diagnosis not present

## 2023-04-07 DIAGNOSIS — I10 Essential (primary) hypertension: Secondary | ICD-10-CM | POA: Diagnosis not present

## 2023-04-07 DIAGNOSIS — Z1231 Encounter for screening mammogram for malignant neoplasm of breast: Secondary | ICD-10-CM

## 2023-04-07 DIAGNOSIS — E039 Hypothyroidism, unspecified: Secondary | ICD-10-CM | POA: Diagnosis not present

## 2023-04-07 DIAGNOSIS — E782 Mixed hyperlipidemia: Secondary | ICD-10-CM

## 2023-04-07 LAB — POCT URINALYSIS DIPSTICK
Bilirubin, UA: NEGATIVE
Blood, UA: NEGATIVE
Glucose, UA: NEGATIVE
Ketones, UA: NEGATIVE
Nitrite, UA: POSITIVE
Protein, UA: NEGATIVE
Spec Grav, UA: 1.015 (ref 1.010–1.025)
Urobilinogen, UA: 1 E.U./dL
pH, UA: 6 (ref 5.0–8.0)

## 2023-04-07 MED ORDER — CEFPODOXIME PROXETIL 100 MG PO TABS: 100.0000 mg | ORAL_TABLET | Freq: Two times a day (BID) | ORAL | 0 refills | Status: AC

## 2023-04-07 NOTE — Progress Notes (Signed)
Established Patient Office Visit  Subjective:  Patient ID: Rebekah Salinas, female    DOB: 15-Jul-1956  Age: 67 y.o. MRN: [id number]  Chief Complaint  Patient presents with   Follow-up    4 month follow up    Patient in office for 4 month follow up.  Did cologuard through insurance 03/2023, normal per patient. Due for a mammogram, order placed.  Recent labs, kidney function decreased, otherwise normal labs. Recommend patient drink more water. Hgb A1c pre diabetic, stable. Recommend following a diabetic diet, exercising.  Patient complaining of dysuria today. Patient in the ED in 01/2023 for pyelonephritis. Urinalysis shows small amount of leukocytes. Will start cefpodoxime.   Dysuria  This is a new problem. The current episode started 1 to 4 weeks ago. The problem occurs every urination. The problem has been unchanged. The quality of the pain is described as burning. There has been no fever. She is Not sexually active. Pertinent negatives include no chills, flank pain, hematuria or possible pregnancy. There is no history of recurrent UTIs.    No other concerns at this time.   Past Medical History:  Diagnosis Date   Acute respiratory disease due to COVID-19 virus 10/09/2019   Diabetes mellitus without complication (HCC)    Hand cramps 12/14/2019   Hospital discharge follow-up 10/25/2019   Hypertension    Thyroid disease     Past Surgical History:  Procedure Laterality Date   ABDOMINAL HYSTERECTOMY     BREAST BIOPSY Left    neg   BREAST BIOPSY Left    core bx- neg   BREAST BIOPSY Left 06/13/2019   [place] bx 2:00 coil    TUBAL LIGATION      Social History   Socioeconomic History   Marital status: Married    Spouse name: Not on file   Number of children: Not on file   Years of education: Not on file   Highest education level: Not on file  Occupational History   Not on file  Tobacco Use   Smoking status: Never   Smokeless tobacco: Never  Vaping Use   Vaping status:  Never Used  Substance and Sexual Activity   Alcohol use: Never   Drug use: Never   Sexual activity: Yes    Birth control/protection: None  Other Topics Concern   Not on file  Social History Narrative   Not on file   Social Determinants of Health   Financial Resource Strain: Not on file  Food Insecurity: No Food Insecurity (04/03/2021)   Hunger Vital Sign    Worried About Running Out of Food in the Last Year: Never true    Ran Out of Food in the Last Year: Never true  Transportation Needs: No Transportation Needs (04/03/2021)   PRAPARE - Administrator, Civil Service (Medical): No    Lack of Transportation (Non-Medical): No  Physical Activity: Not on file  Stress: Not on file  Social Connections: Not on file  Intimate Partner Violence: Not on file    Family History  Problem Relation Age of Onset   Other Mother        murdered   Other Father        unknown medical history   CAD Sister    Breast cancer Sister 69       twin sister   Cancer Sister    Cancer Maternal Grandmother     Allergies  Allergen Reactions   Aspirin Nausea And Vomiting  Review of Systems  Constitutional:  Negative for chills.  Genitourinary:  Positive for dysuria. Negative for flank pain and hematuria.       Objective:   BP 124/86   Pulse 79   Ht 5\' 4"  (1.626 m)   Wt 210 lb 6.4 oz (95.4 kg)   SpO2 100%   BMI 36.12 kg/m   Vitals:   04/07/23 0904  BP: 124/86  Pulse: 79  Height: 5\' 4"  (1.626 m)  Weight: 210 lb 6.4 oz (95.4 kg)  SpO2: 100%  BMI (Calculated): 36.1    Physical Exam Vitals and nursing note reviewed.  Constitutional:      Appearance: Normal appearance. She is normal weight.  HENT:     Head: Normocephalic and atraumatic.     Nose: Nose normal.     Mouth/Throat:     Mouth: Mucous membranes are moist.  Eyes:     Extraocular Movements: Extraocular movements intact.     Conjunctiva/sclera: Conjunctivae normal.     Pupils: Pupils are equal, round, and  reactive to light.  Cardiovascular:     Rate and Rhythm: Normal rate and regular rhythm.     Pulses: Normal pulses.     Heart sounds: Normal heart sounds.  Pulmonary:     Effort: Pulmonary effort is normal.     Breath sounds: Normal breath sounds.  Abdominal:     General: Abdomen is flat. Bowel sounds are normal.     Palpations: Abdomen is soft.  Musculoskeletal:        General: Normal range of motion.     Cervical back: Normal range of motion.  Skin:    General: Skin is warm and dry.  Neurological:     General: No focal deficit present.     Mental Status: She is alert and oriented to person, place, and time.  Psychiatric:        Mood and Affect: Mood normal.        Behavior: Behavior normal.        Thought Content: Thought content normal.        Judgment: Judgment normal.      Results for orders placed or performed in visit on 04/07/23  POCT Urinalysis Dipstick (81002)  Result Value Ref Range   Color, UA yellow    Clarity, UA cloudy    Glucose, UA Negative Negative   Bilirubin, UA neg    Ketones, UA neg    Spec Grav, UA 1.015 1.010 - 1.025   Blood, UA neg    pH, UA 6.0 5.0 - 8.0   Protein, UA Negative Negative   Urobilinogen, UA 1.0 0.2 or 1.0 E.U./dL   Nitrite, UA pos    Leukocytes, UA Small (1+) (A) Negative   Appearance cloudy    Odor yes     Recent Results (from the past 2160 hour(s))  Lipase, blood     Status: None   Collection Time: 02/10/23  3:36 PM  Result Value Ref Range   Lipase 32 11 - 51 U/L    Comment: Performed at [facility], [address] [place], [place] [address]  Comprehensive metabolic panel     Status: Abnormal   Collection Time: 02/10/23  3:36 PM  Result Value Ref Range   Sodium 137 135 - 145 mmol/L   Potassium 3.9 3.5 - 5.1 mmol/L   Chloride 104 98 - 111 mmol/L   CO2 24 22 - 32 mmol/L   Glucose, Bld 91 70 - 99 mg/dL  Comment: Glucose reference range applies only to samples taken after fasting for at least 8 hours.    BUN 15 8 - 23 mg/dL   Creatinine, Ser 1.61 (H) 0.44 - 1.00 mg/dL   Calcium 9.1 8.9 - 09.6 mg/dL   Total Protein 7.5 6.5 - 8.1 g/dL   Albumin 3.8 3.5 - 5.0 g/dL   AST 17 15 - 41 U/L   ALT 14 0 - 44 U/L   Alkaline Phosphatase 93 38 - 126 U/L   Total Bilirubin 1.2 0.3 - 1.2 mg/dL   GFR, Estimated 57 (L) >60 mL/min    Comment: (NOTE) Calculated using the CKD-EPI Creatinine Equation (2021)    Anion gap 9 5 - 15    Comment: Performed at [facility], [address] [place], [place] [address]  CBC     Status: None   Collection Time: 02/10/23  3:36 PM  Result Value Ref Range   WBC 7.4 4.0 - 10.5 K/uL   RBC 4.74 3.87 - 5.11 MIL/uL   Hemoglobin 13.1 12.0 - 15.0 g/dL   HCT 98.1 19.1 - 47.8 %   MCV 87.8 80.0 - 100.0 fL   MCH 27.6 26.0 - 34.0 pg   MCHC 31.5 30.0 - 36.0 g/dL   RDW 29.5 62.1 - 30.8 %   Platelets 354 150 - 400 K/uL   nRBC 0.0 0.0 - 0.2 %    Comment: Performed at [facility], [address] [place], [place] [address]  Urinalysis, Routine w reflex microscopic -Urine, Clean Catch     Status: Abnormal   Collection Time: 02/10/23  3:36 PM  Result Value Ref Range   Color, Urine STRAW (A) YELLOW   APPearance HAZY (A) CLEAR   Specific Gravity, Urine 1.004 (L) 1.005 - 1.030   pH 5.0 5.0 - 8.0   Glucose, UA NEGATIVE NEGATIVE mg/dL   Hgb urine dipstick MODERATE (A) NEGATIVE   Bilirubin Urine NEGATIVE NEGATIVE   Ketones, ur NEGATIVE NEGATIVE mg/dL   Protein, ur 30 (A) NEGATIVE mg/dL   Nitrite NEGATIVE NEGATIVE   Leukocytes,Ua SMALL (A) NEGATIVE   RBC / HPF 0-5 0 - 5 RBC/hpf   WBC, UA 21-50 0 - 5 WBC/hpf   Bacteria, UA MANY (A) NONE SEEN   Squamous Epithelial / HPF 0-5 0 - 5 /HPF   Mucus PRESENT     Comment: Performed at [facility], [address] [place], [place] [address]  Urine Culture (for pregnant, neutropenic or urologic patients or patients with an indwelling urinary catheter)     Status: Abnormal   Collection Time: 02/10/23  3:36 PM    Specimen: Urine, Clean Catch  Result Value Ref Range   Specimen Description      URINE, CLEAN CATCH Performed at [facility], [address] [place], [place] [address]    Special Requests      NONE Performed at [facility], [address] [place], [place] [address]    Culture >=100,000 COLONIES/mL ESCHERICHIA COLI (A)    Report Status 02/12/2023 FINAL    Organism ID, Bacteria ESCHERICHIA COLI (A)       Susceptibility   Escherichia coli - MIC*    AMPICILLIN 8 SENSITIVE Sensitive     CEFAZOLIN <=4 SENSITIVE Sensitive     CEFEPIME <=0.12 SENSITIVE Sensitive     CIPROFLOXACIN <=0.25 SENSITIVE Sensitive     GENTAMICIN <=1 SENSITIVE Sensitive     IMIPENEM <=0.25 SENSITIVE Sensitive     NITROFURANTOIN 32  SENSITIVE Sensitive     TRIMETH/SULFA <=20 SENSITIVE Sensitive     AMPICILLIN/SULBACTAM 4 SENSITIVE Sensitive     PIP/TAZO <=4 SENSITIVE Sensitive     * >=100,000 COLONIES/mL ESCHERICHIA COLI  Hemoglobin A1c     Status: Abnormal   Collection Time: 03/12/23 11:27 AM  Result Value Ref Range   Hgb A1c MFr Bld 6.1 (H) 4.8 - 5.6 %    Comment:          Prediabetes: 5.7 - 6.4          Diabetes: >6.4          Glycemic control for adults with diabetes: <7.0    Est. average glucose Bld gHb Est-mCnc 128 mg/dL  TSH     Status: None   Collection Time: 03/12/23 11:27 AM  Result Value Ref Range   TSH 2.960 0.450 - 4.500 uIU/mL  CMP14+EGFR     Status: Abnormal   Collection Time: 03/12/23 11:27 AM  Result Value Ref Range   Glucose 89 70 - 99 mg/dL   BUN 15 8 - 27 mg/dL   Creatinine, Ser 8.65 (H) 0.57 - 1.00 mg/dL   eGFR 54 (L) >78 IO/NGE/9.52   BUN/Creatinine Ratio 13 12 - 28   Sodium 141 134 - 144 mmol/L   Potassium 4.6 3.5 - 5.2 mmol/L   Chloride 104 96 - 106 mmol/L   CO2 24 20 - 29 mmol/L   Calcium 9.2 8.7 - 10.3 mg/dL   Total Protein 6.5 6.0 - 8.5 g/dL   Albumin 4.1 3.9 - 4.9 g/dL   Globulin, Total 2.4 1.5 - 4.5 g/dL   Bilirubin Total 0.4 0.0 - 1.2 mg/dL    Alkaline Phosphatase 111 44 - 121 IU/L   AST 21 0 - 40 IU/L   ALT 14 0 - 32 IU/L  Lipid panel     Status: None   Collection Time: 03/12/23 11:27 AM  Result Value Ref Range   Cholesterol, Total 137 100 - 199 mg/dL   Triglycerides 69 0 - 149 mg/dL   HDL 45 >84 mg/dL   VLDL Cholesterol Cal 14 5 - 40 mg/dL   LDL Chol Calc (NIH) 78 0 - 99 mg/dL   Chol/HDL Ratio 3.0 0.0 - 4.4 ratio    Comment:                                   T. Chol/HDL Ratio                                             Men  Women                               1/2 Avg.Risk  3.4    3.3                                   Avg.Risk  5.0    4.4                                2X Avg.Risk  9.6    7.1  3X Avg.Risk 23.4   11.0   POCT Urinalysis Dipstick (16109)     Status: Abnormal   Collection Time: 04/07/23  9:08 AM  Result Value Ref Range   Color, UA yellow    Clarity, UA cloudy    Glucose, UA Negative Negative   Bilirubin, UA neg    Ketones, UA neg    Spec Grav, UA 1.015 1.010 - 1.025   Blood, UA neg    pH, UA 6.0 5.0 - 8.0   Protein, UA Negative Negative   Urobilinogen, UA 1.0 0.2 or 1.0 E.U./dL   Nitrite, UA pos    Leukocytes, UA Small (1+) (A) Negative   Appearance cloudy    Odor yes       Assessment & Plan:  Order for mammogram placed. Increase water intake for decreased kidney function. Diabetic diet for pre diabetes. Cefpodoxime for UTI.   Problem List Items Addressed This Visit       Cardiovascular and Mediastinum   Essential hypertension     Endocrine   Hypothyroidism   Relevant Orders   TSH     Other   Prediabetes   Relevant Orders   CMP14+EGFR   Hemoglobin A1c   Encounter for screening mammogram for malignant neoplasm of breast   Relevant Orders   MM 3D SCREENING MAMMOGRAM BILATERAL BREAST   HLD (hyperlipidemia)   Relevant Orders   Lipid Profile   Dysuria - Primary   Relevant Orders   POCT Urinalysis Dipstick (60454) (Completed)   Culture, Urine     Return in about 4 months (around 08/08/2023) for with fasting labs prior.   Total time spent: 30 minutes  Kilie Rund, NP  04/07/2023   This document may have been prepared by Dragon Voice Recognition software and as such may include unintentional dictation errors.

## 2023-04-08 ENCOUNTER — Other Ambulatory Visit: Payer: Self-pay | Admitting: Nurse Practitioner

## 2023-04-08 DIAGNOSIS — I10 Essential (primary) hypertension: Secondary | ICD-10-CM

## 2023-04-08 NOTE — Telephone Encounter (Signed)
Received fax that patient needs lisinopril 5 mg sent in. Please advise.

## 2023-04-09 MED ORDER — LISINOPRIL 5 MG PO TABS
5.0000 mg | ORAL_TABLET | Freq: Every day | ORAL | 4 refills | Status: DC
Start: 2023-04-09 — End: 2024-03-17

## 2023-04-10 LAB — URINE CULTURE

## 2023-04-17 ENCOUNTER — Other Ambulatory Visit: Payer: No Typology Code available for payment source

## 2023-04-19 ENCOUNTER — Other Ambulatory Visit: Payer: Self-pay | Admitting: Nurse Practitioner

## 2023-04-19 DIAGNOSIS — I10 Essential (primary) hypertension: Secondary | ICD-10-CM

## 2023-04-28 ENCOUNTER — Ambulatory Visit
Admission: RE | Admit: 2023-04-28 | Discharge: 2023-04-28 | Disposition: A | Discharge status: Home / Self Care | Payer: No Typology Code available for payment source | Source: Ambulatory Visit | Attending: Cardiology | Admitting: Cardiology

## 2023-04-28 DIAGNOSIS — Z1231 Encounter for screening mammogram for malignant neoplasm of breast: Secondary | ICD-10-CM | POA: Diagnosis present

## 2023-04-29 ENCOUNTER — Other Ambulatory Visit: Payer: Self-pay | Admitting: Cardiology

## 2023-04-29 DIAGNOSIS — N6489 Other specified disorders of breast: Secondary | ICD-10-CM

## 2023-04-29 DIAGNOSIS — R928 Other abnormal and inconclusive findings on diagnostic imaging of breast: Secondary | ICD-10-CM

## 2023-04-30 NOTE — Progress Notes (Signed)
Patient notified

## 2023-05-05 ENCOUNTER — Ambulatory Visit
Admission: RE | Admit: 2023-05-05 | Discharge: 2023-05-05 | Disposition: A | Discharge status: Home / Self Care | Payer: No Typology Code available for payment source | Source: Ambulatory Visit | Attending: Cardiology | Admitting: Cardiology

## 2023-05-05 ENCOUNTER — Ambulatory Visit: Payer: No Typology Code available for payment source

## 2023-05-05 DIAGNOSIS — N6489 Other specified disorders of breast: Secondary | ICD-10-CM | POA: Insufficient documentation

## 2023-05-05 DIAGNOSIS — R928 Other abnormal and inconclusive findings on diagnostic imaging of breast: Secondary | ICD-10-CM | POA: Diagnosis not present

## 2023-05-06 NOTE — Progress Notes (Signed)
Called patient and patient verbalized understanding  ?

## 2023-05-11 ENCOUNTER — Other Ambulatory Visit: Payer: Self-pay

## 2023-05-11 DIAGNOSIS — E039 Hypothyroidism, unspecified: Secondary | ICD-10-CM

## 2023-05-11 MED ORDER — LEVOTHYROXINE SODIUM 75 MCG PO TABS
ORAL_TABLET | ORAL | 1 refills | Status: DC
Start: 2023-05-11 — End: 2024-01-07

## 2023-07-21 ENCOUNTER — Telehealth: Payer: Self-pay

## 2023-07-21 NOTE — Telephone Encounter (Signed)
Pt lm asking for call back but didn't leave a reason for the VM

## 2023-07-22 ENCOUNTER — Other Ambulatory Visit: Payer: Self-pay

## 2023-07-22 DIAGNOSIS — M542 Cervicalgia: Secondary | ICD-10-CM

## 2023-07-22 DIAGNOSIS — M545 Low back pain, unspecified: Secondary | ICD-10-CM

## 2023-07-22 DIAGNOSIS — G8929 Other chronic pain: Secondary | ICD-10-CM

## 2023-07-22 MED ORDER — CYCLOBENZAPRINE HCL 10 MG PO TABS: ORAL_TABLET | Freq: Two times a day (BID) | ORAL | 3 refills | Status: DC

## 2023-07-22 NOTE — Telephone Encounter (Signed)
Patient was called and requested if we could send her muscle relaxer to [facility] mail order and request has been sent off.

## 2023-08-07 ENCOUNTER — Other Ambulatory Visit: Payer: No Typology Code available for payment source

## 2023-08-08 LAB — CMP14+EGFR
ALT: 16 [IU]/L (ref 0–32)
AST: 19 [IU]/L (ref 0–40)
Albumin: 4.3 g/dL (ref 3.9–4.9)
Alkaline Phosphatase: 94 [IU]/L (ref 44–121)
BUN/Creatinine Ratio: 10 — ABNORMAL LOW (ref 12–28)
BUN: 12 mg/dL (ref 8–27)
Bilirubin Total: 0.5 mg/dL (ref 0.0–1.2)
CO2: 25 mmol/L (ref 20–29)
Calcium: 9.5 mg/dL (ref 8.7–10.3)
Chloride: 104 mmol/L (ref 96–106)
Creatinine, Ser: 1.19 mg/dL — ABNORMAL HIGH (ref 0.57–1.00)
Globulin, Total: 2.7 g/dL (ref 1.5–4.5)
Glucose: 96 mg/dL (ref 70–99)
Potassium: 4.6 mmol/L (ref 3.5–5.2)
Sodium: 140 mmol/L (ref 134–144)
Total Protein: 7 g/dL (ref 6.0–8.5)
eGFR: 50 mL/min/{1.73_m2} — ABNORMAL LOW (ref >59)

## 2023-08-08 LAB — LIPID PANEL
Chol/HDL Ratio: 3.7 {ratio} (ref 0.0–4.4)
Cholesterol, Total: 161 mg/dL (ref 100–199)
HDL: 43 mg/dL (ref >39)
LDL Chol Calc (NIH): 101 mg/dL — ABNORMAL HIGH (ref 0–99)
Triglycerides: 92 mg/dL (ref 0–149)
VLDL Cholesterol Cal: 17 mg/dL (ref 5–40)

## 2023-08-08 LAB — HEMOGLOBIN A1C
Est. average glucose Bld gHb Est-mCnc: 134 mg/dL
Hgb A1c MFr Bld: 6.3 % — ABNORMAL HIGH (ref 4.8–5.6)

## 2023-08-08 LAB — TSH: TSH: 3.3 u[IU]/mL (ref 0.450–4.500)

## 2023-08-10 ENCOUNTER — Encounter: Payer: Self-pay | Admitting: Cardiology

## 2023-08-10 ENCOUNTER — Ambulatory Visit (INDEPENDENT_AMBULATORY_CARE_PROVIDER_SITE_OTHER): Payer: No Typology Code available for payment source | Admitting: Cardiology

## 2023-08-10 VITALS — BP 128/75 | HR 80 | Ht 64.0 in | Wt 212.4 lb

## 2023-08-10 DIAGNOSIS — R7303 Prediabetes: Secondary | ICD-10-CM | POA: Diagnosis not present

## 2023-08-10 DIAGNOSIS — E039 Hypothyroidism, unspecified: Secondary | ICD-10-CM | POA: Diagnosis not present

## 2023-08-10 DIAGNOSIS — I10 Essential (primary) hypertension: Secondary | ICD-10-CM

## 2023-08-10 DIAGNOSIS — E782 Mixed hyperlipidemia: Secondary | ICD-10-CM | POA: Diagnosis not present

## 2023-08-10 NOTE — Progress Notes (Signed)
Established Patient Office Visit  Subjective:  Patient ID: Rebekah Salinas, female    DOB: [birth year]  Age: 67 y.o. MRN: [id number]  Chief Complaint  Patient presents with   Follow-up    Patient in office for 4 month follow up, discuss recent lab results. Patient reports feeling well, no complaints today. Discussed recent labs, elevated Hgb A1c, discussed following a strict diabetic diet. LDL elevated, patient reports taking atorvastatin only when she eats fried foods, instructed patient to take atorvastatin daily.  Up to date on mammogram and colonoscopy.     No other concerns at this time.   Past Medical History:  Diagnosis Date   Acute respiratory disease due to COVID-19 virus 10/09/2019   Diabetes mellitus without complication (HCC)    Hand cramps 12/14/2019   Hospital discharge follow-up 10/25/2019   Hypertension    Thyroid disease     Past Surgical History:  Procedure Laterality Date   ABDOMINAL HYSTERECTOMY     BREAST BIOPSY Left    neg   BREAST BIOPSY Left    core bx- neg   BREAST BIOPSY Left 06/13/2019   [place] bx 2:00 coil    TUBAL LIGATION      Social History   Socioeconomic History   Marital status: Married    Spouse name: Not on file   Number of children: Not on file   Years of education: Not on file   Highest education level: Not on file  Occupational History   Not on file  Tobacco Use   Smoking status: Never   Smokeless tobacco: Never  Vaping Use   Vaping status: Never Used  Substance and Sexual Activity   Alcohol use: Never   Drug use: Never   Sexual activity: Yes    Birth control/protection: None  Other Topics Concern   Not on file  Social History Narrative   Not on file   Social Determinants of Health   Financial Resource Strain: Not on file  Food Insecurity: No Food Insecurity (04/03/2021)   Hunger Vital Sign    Worried About Running Out of Food in the Last Year: Never true    Ran Out of Food in the Last Year: Never true   Transportation Needs: No Transportation Needs (04/03/2021)   PRAPARE - Administrator, Civil Service (Medical): No    Lack of Transportation (Non-Medical): No  Physical Activity: Not on file  Stress: Not on file  Social Connections: Not on file  Intimate Partner Violence: Not on file    Family History  Problem Relation Age of Onset   Other Mother        murdered   Other Father        unknown medical history   CAD Sister    Breast cancer Sister 58       twin sister   Cancer Sister    Cancer Maternal Grandmother     Allergies  Allergen Reactions   Aspirin Nausea And Vomiting    Outpatient Medications Prior to Visit  Medication Sig   Multiple Vitamin (MULTIVITAMIN WITH MINERALS) TABS tablet Take 1 tablet by mouth daily.   acetaminophen (TYLENOL) 325 MG tablet Take 650 mg by mouth every 6 (six) hours as needed.   atorvastatin (LIPITOR) 40 MG tablet Take 1 tablet (40 mg total) by mouth daily.   cyclobenzaprine (FLEXERIL) 10 MG tablet TAKE ONE TABLET BY MOUTH 2 TIMES A DAY   hydrochlorothiazide (MICROZIDE) 12.5 MG capsule Take 12.5 mg by mouth  daily.   levothyroxine (SYNTHROID) 75 MCG tablet TAKE ONE TABLET BY MOUTH EVERY DAY BEFORE BREAKFAST.   lisinopril (ZESTRIL) 5 MG tablet Take 1 tablet (5 mg total) by mouth once daily.   omeprazole (PRILOSEC) 20 MG capsule Take 1 capsule (20 mg total) by mouth daily.   ondansetron (ZOFRAN-ODT) 4 MG disintegrating tablet Take 1 tablet (4 mg total) by mouth every 8 (eight) hours as needed for nausea or vomiting.   primidone (MYSOLINE) 50 MG tablet TAKE ONE TABLET BY MOUTH 2 TIMES A DAY   [DISCONTINUED] ascorbic acid (VITAMIN C) 500 MG tablet Take 1 tablet (500 mg total) by mouth daily.   [DISCONTINUED] cholecalciferol (VITAMIN D3) 25 MCG (1000 UNIT) tablet Take 1,000 Units by mouth daily.   [DISCONTINUED] meloxicam (MOBIC) 15 MG tablet Take 1 tablet by mouth daily.   [DISCONTINUED] metFORMIN (GLUCOPHAGE) 1000 MG tablet TAKE ONE  TABLET (1000MG ) BY MOUTH EVERY DAY WITH BREAKFAST.   [DISCONTINUED] zinc sulfate 220 (50 Zn) MG capsule Take 1 capsule (220 mg total) by mouth daily.   No facility-administered medications prior to visit.    Review of Systems  Constitutional: Negative.   HENT: Negative.    Eyes: Negative.   Respiratory: Negative.  Negative for shortness of breath.   Cardiovascular: Negative.  Negative for chest pain.  Gastrointestinal: Negative.  Negative for abdominal pain, constipation and diarrhea.  Genitourinary: Negative.   Musculoskeletal:  Negative for joint pain and myalgias.  Skin: Negative.   Neurological: Negative.  Negative for dizziness and headaches.  Endo/Heme/Allergies: Negative.   All other systems reviewed and are negative.      Objective:   BP 128/75   Pulse 80   Ht 5\' 4"  (1.626 m)   Wt 212 lb 6.4 oz (96.3 kg)   SpO2 98%   BMI 36.46 kg/m   Vitals:   08/10/23 0903  BP: 128/75  Pulse: 80  Height: 5\' 4"  (1.626 m)  Weight: 212 lb 6.4 oz (96.3 kg)  SpO2: 98%  BMI (Calculated): 36.44    Physical Exam Vitals and nursing note reviewed.  Constitutional:      Appearance: Normal appearance. She is normal weight.  HENT:     Head: Normocephalic and atraumatic.     Nose: Nose normal.     Mouth/Throat:     Mouth: Mucous membranes are moist.  Eyes:     Extraocular Movements: Extraocular movements intact.     Conjunctiva/sclera: Conjunctivae normal.     Pupils: Pupils are equal, round, and reactive to light.  Cardiovascular:     Rate and Rhythm: Normal rate and regular rhythm.     Pulses: Normal pulses.     Heart sounds: Normal heart sounds.  Pulmonary:     Effort: Pulmonary effort is normal.     Breath sounds: Normal breath sounds.  Abdominal:     General: Abdomen is flat. Bowel sounds are normal.     Palpations: Abdomen is soft.  Musculoskeletal:        General: Normal range of motion.     Cervical back: Normal range of motion.  Skin:    General: Skin is warm  and dry.  Neurological:     General: No focal deficit present.     Mental Status: She is alert and oriented to person, place, and time.  Psychiatric:        Mood and Affect: Mood normal.        Behavior: Behavior normal.        Thought  Content: Thought content normal.        Judgment: Judgment normal.      No results found for any visits on 08/10/23.  Recent Results (from the past 2160 hour(s))  Lipid Profile     Status: Abnormal   Collection Time: 08/07/23  9:29 AM  Result Value Ref Range   Cholesterol, Total 161 100 - 199 mg/dL   Triglycerides 92 0 - 149 mg/dL   HDL 43 >62 mg/dL   VLDL Cholesterol Cal 17 5 - 40 mg/dL   LDL Chol Calc (NIH) 952 (H) 0 - 99 mg/dL   Chol/HDL Ratio 3.7 0.0 - 4.4 ratio    Comment:                                   T. Chol/HDL Ratio                                             Men  Women                               1/2 Avg.Risk  3.4    3.3                                   Avg.Risk  5.0    4.4                                2X Avg.Risk  9.6    7.1                                3X Avg.Risk 23.4   11.0   CMP14+EGFR     Status: Abnormal   Collection Time: 08/07/23  9:29 AM  Result Value Ref Range   Glucose 96 70 - 99 mg/dL   BUN 12 8 - 27 mg/dL   Creatinine, Ser 8.41 (H) 0.57 - 1.00 mg/dL   eGFR 50 (L) >32 GM/WNU/2.72   BUN/Creatinine Ratio 10 (L) 12 - 28   Sodium 140 134 - 144 mmol/L   Potassium 4.6 3.5 - 5.2 mmol/L   Chloride 104 96 - 106 mmol/L   CO2 25 20 - 29 mmol/L   Calcium 9.5 8.7 - 10.3 mg/dL   Total Protein 7.0 6.0 - 8.5 g/dL   Albumin 4.3 3.9 - 4.9 g/dL   Globulin, Total 2.7 1.5 - 4.5 g/dL   Bilirubin Total 0.5 0.0 - 1.2 mg/dL   Alkaline Phosphatase 94 44 - 121 IU/L   AST 19 0 - 40 IU/L   ALT 16 0 - 32 IU/L  TSH     Status: None   Collection Time: 08/07/23  9:29 AM  Result Value Ref Range   TSH 3.300 0.450 - 4.500 uIU/mL  Hemoglobin A1c     Status: Abnormal   Collection Time: 08/07/23  9:29 AM  Result Value Ref Range    Hgb A1c MFr Bld 6.3 (H) 4.8 - 5.6 %    Comment:          Prediabetes: 5.7 - 6.4  Diabetes: >6.4          Glycemic control for adults with diabetes: <7.0    Est. average glucose Bld gHb Est-mCnc 134 mg/dL      Assessment & Plan:  Follow a strict diabetic diet. Take atorvastatin daily. Due to transportation, can due blood work day of visit.   Problem List Items Addressed This Visit       Cardiovascular and Mediastinum   Essential hypertension - Primary     Endocrine   Hypothyroidism     Other   Prediabetes   HLD (hyperlipidemia)    Return in about 4 months (around 12/08/2023) for with fasting labs prior.   Total time spent: 25 minutes  [employer], NP  08/10/2023   This document may have been prepared by Dragon Voice Recognition software and as such may include unintentional dictation errors.

## 2023-12-08 ENCOUNTER — Ambulatory Visit (INDEPENDENT_AMBULATORY_CARE_PROVIDER_SITE_OTHER): Payer: No Typology Code available for payment source | Admitting: Cardiology

## 2023-12-08 ENCOUNTER — Encounter: Payer: Self-pay | Admitting: Cardiology

## 2023-12-08 VITALS — BP 112/76 | HR 81 | Ht 64.0 in | Wt 206.0 lb

## 2023-12-08 DIAGNOSIS — R7303 Prediabetes: Secondary | ICD-10-CM | POA: Diagnosis not present

## 2023-12-08 DIAGNOSIS — E039 Hypothyroidism, unspecified: Secondary | ICD-10-CM | POA: Diagnosis not present

## 2023-12-08 DIAGNOSIS — E782 Mixed hyperlipidemia: Secondary | ICD-10-CM

## 2023-12-08 DIAGNOSIS — I10 Essential (primary) hypertension: Secondary | ICD-10-CM | POA: Diagnosis not present

## 2023-12-08 NOTE — Progress Notes (Signed)
 Established Patient Office Visit  Subjective:  Patient ID: Rebekah Salinas, female    DOB: [birth year]  Age: 68 y.o. MRN: [id number]  Chief Complaint  Patient presents with   Follow-up    4 Months Lab Results    Patient in office for 4 month follow up. Patient did not have lab work done. Fasting today, will do labs. Patient doing well, no complaints today.  Continue same medications.    No other concerns at this time.   Past Medical History:  Diagnosis Date   Acute respiratory disease due to COVID-19 virus 10/09/2019   Diabetes mellitus without complication (HCC)    Hand cramps 12/14/2019   Hospital discharge follow-up 10/25/2019   Hypertension    Thyroid disease     Past Surgical History:  Procedure Laterality Date   ABDOMINAL HYSTERECTOMY     BREAST BIOPSY Left    neg   BREAST BIOPSY Left    core bx- neg   BREAST BIOPSY Left 06/13/2019   [place] bx 2:00 coil    TUBAL LIGATION      Social History   Socioeconomic History   Marital status: Married    Spouse name: Not on file   Number of children: Not on file   Years of education: Not on file   Highest education level: Not on file  Occupational History   Not on file  Tobacco Use   Smoking status: Never   Smokeless tobacco: Never  Vaping Use   Vaping status: Never Used  Substance and Sexual Activity   Alcohol use: Never   Drug use: Never   Sexual activity: Yes    Birth control/protection: None  Other Topics Concern   Not on file  Social History Narrative   Not on file   Social Drivers of Health   Financial Resource Strain: Not on file  Food Insecurity: No Food Insecurity (04/03/2021)   Hunger Vital Sign    Worried About Running Out of Food in the Last Year: Never true    Ran Out of Food in the Last Year: Never true  Transportation Needs: No Transportation Needs (04/03/2021)   PRAPARE - Administrator, Civil Service (Medical): No    Lack of Transportation (Non-Medical): No  Physical  Activity: Not on file  Stress: Not on file  Social Connections: Not on file  Intimate Partner Violence: Not on file    Family History  Problem Relation Age of Onset   Other Mother        murdered   Other Father        unknown medical history   CAD Sister    Breast cancer Sister 23       twin sister   Cancer Sister    Cancer Maternal Grandmother     Allergies  Allergen Reactions   Aspirin Nausea And Vomiting    Outpatient Medications Prior to Visit  Medication Sig   acetaminophen (TYLENOL) 325 MG tablet Take 650 mg by mouth every 6 (six) hours as needed.   atorvastatin (LIPITOR) 40 MG tablet Take 1 tablet (40 mg total) by mouth daily.   cyclobenzaprine (FLEXERIL) 10 MG tablet TAKE ONE TABLET BY MOUTH 2 TIMES A DAY   hydrochlorothiazide (MICROZIDE) 12.5 MG capsule Take 12.5 mg by mouth daily.   levothyroxine (SYNTHROID) 75 MCG tablet TAKE ONE TABLET BY MOUTH EVERY DAY BEFORE BREAKFAST.   lisinopril (ZESTRIL) 5 MG tablet Take 1 tablet (5 mg total) by mouth once daily.  Multiple Vitamin (MULTIVITAMIN WITH MINERALS) TABS tablet Take 1 tablet by mouth daily.   omeprazole (PRILOSEC) 20 MG capsule Take 1 capsule (20 mg total) by mouth daily.   ondansetron (ZOFRAN-ODT) 4 MG disintegrating tablet Take 1 tablet (4 mg total) by mouth every 8 (eight) hours as needed for nausea or vomiting.   primidone (MYSOLINE) 50 MG tablet TAKE ONE TABLET BY MOUTH 2 TIMES A DAY   No facility-administered medications prior to visit.    Review of Systems  Constitutional: Negative.   HENT: Negative.    Eyes: Negative.   Respiratory: Negative.  Negative for shortness of breath.   Cardiovascular: Negative.  Negative for chest pain.  Gastrointestinal: Negative.  Negative for abdominal pain, constipation and diarrhea.  Genitourinary: Negative.   Musculoskeletal:  Negative for joint pain and myalgias.  Skin: Negative.   Neurological: Negative.  Negative for dizziness and headaches.   Endo/Heme/Allergies: Negative.   All other systems reviewed and are negative.      Objective:   BP 112/76   Pulse 81   Ht 5\' 4"  (1.626 m)   Wt 206 lb (93.4 kg)   SpO2 98%   BMI 35.36 kg/m   Vitals:   12/08/23 0906  BP: 112/76  Pulse: 81  Height: 5\' 4"  (1.626 m)  Weight: 206 lb (93.4 kg)  SpO2: 98%  BMI (Calculated): 35.34    Physical Exam Vitals and nursing note reviewed.  Constitutional:      Appearance: Normal appearance. She is normal weight.  HENT:     Head: Normocephalic and atraumatic.     Nose: Nose normal.     Mouth/Throat:     Mouth: Mucous membranes are moist.  Eyes:     Extraocular Movements: Extraocular movements intact.     Conjunctiva/sclera: Conjunctivae normal.     Pupils: Pupils are equal, round, and reactive to light.  Cardiovascular:     Rate and Rhythm: Normal rate and regular rhythm.     Pulses: Normal pulses.     Heart sounds: Normal heart sounds.  Pulmonary:     Effort: Pulmonary effort is normal.     Breath sounds: Normal breath sounds.  Abdominal:     General: Abdomen is flat. Bowel sounds are normal.     Palpations: Abdomen is soft.  Musculoskeletal:        General: Normal range of motion.     Cervical back: Normal range of motion.  Skin:    General: Skin is warm and dry.  Neurological:     General: No focal deficit present.     Mental Status: She is alert and oriented to person, place, and time.  Psychiatric:        Mood and Affect: Mood normal.        Behavior: Behavior normal.        Thought Content: Thought content normal.        Judgment: Judgment normal.      No results found for any visits on 12/08/23.  No results found for this or any previous visit (from the past 2160 hours).    Assessment & Plan:  Continue same medications.   Problem List Items Addressed This Visit       Cardiovascular and Mediastinum   Essential hypertension - Primary   Relevant Orders   CMP14+EGFR     Endocrine   Hypothyroidism    Relevant Orders   TSH     Other   Prediabetes   Relevant Orders   Hemoglobin  A1c   HLD (hyperlipidemia)   Relevant Orders   Lipid Profile    Return in about 4 months (around 04/08/2024).   Total time spent: 25 minutes  Google, NP  12/08/2023   This document may have been prepared by Dragon Voice Recognition software and as such may include unintentional dictation errors.

## 2023-12-09 ENCOUNTER — Other Ambulatory Visit: Payer: Self-pay | Admitting: Cardiology

## 2023-12-09 DIAGNOSIS — E039 Hypothyroidism, unspecified: Secondary | ICD-10-CM

## 2023-12-09 LAB — CMP14+EGFR
ALT: 16 IU/L (ref 0–32)
AST: 18 IU/L (ref 0–40)
Albumin: 4.3 g/dL (ref 3.9–4.9)
Alkaline Phosphatase: 102 IU/L (ref 44–121)
BUN/Creatinine Ratio: 9 — ABNORMAL LOW (ref 12–28)
BUN: 14 mg/dL (ref 8–27)
Bilirubin Total: 0.4 mg/dL (ref 0.0–1.2)
CO2: 23 mmol/L (ref 20–29)
Calcium: 9.4 mg/dL (ref 8.7–10.3)
Chloride: 101 mmol/L (ref 96–106)
Creatinine, Ser: 1.51 mg/dL — ABNORMAL HIGH (ref 0.57–1.00)
Globulin, Total: 2.6 g/dL (ref 1.5–4.5)
Glucose: 91 mg/dL (ref 70–99)
Potassium: 4.5 mmol/L (ref 3.5–5.2)
Sodium: 140 mmol/L (ref 134–144)
Total Protein: 6.9 g/dL (ref 6.0–8.5)
eGFR: 38 mL/min/{1.73_m2} — ABNORMAL LOW (ref >59)

## 2023-12-09 LAB — TSH: TSH: 6.54 u[IU]/mL — ABNORMAL HIGH (ref 0.450–4.500)

## 2023-12-09 LAB — LIPID PANEL
Chol/HDL Ratio: 3.6 ratio (ref 0.0–4.4)
Cholesterol, Total: 150 mg/dL (ref 100–199)
HDL: 42 mg/dL (ref >39)
LDL Chol Calc (NIH): 88 mg/dL (ref 0–99)
Triglycerides: 108 mg/dL (ref 0–149)
VLDL Cholesterol Cal: 20 mg/dL (ref 5–40)

## 2023-12-09 LAB — HEMOGLOBIN A1C
Est. average glucose Bld gHb Est-mCnc: 134 mg/dL
Hgb A1c MFr Bld: 6.3 % — ABNORMAL HIGH (ref 4.8–5.6)

## 2024-01-07 ENCOUNTER — Other Ambulatory Visit: Payer: Self-pay

## 2024-01-07 DIAGNOSIS — G250 Essential tremor: Secondary | ICD-10-CM

## 2024-01-07 DIAGNOSIS — Z8719 Personal history of other diseases of the digestive system: Secondary | ICD-10-CM

## 2024-01-07 DIAGNOSIS — E039 Hypothyroidism, unspecified: Secondary | ICD-10-CM

## 2024-01-07 DIAGNOSIS — Z8639 Personal history of other endocrine, nutritional and metabolic disease: Secondary | ICD-10-CM

## 2024-01-07 MED ORDER — LEVOTHYROXINE SODIUM 75 MCG PO TABS
ORAL_TABLET | ORAL | 1 refills | Status: DC
Start: 2024-01-07 — End: 2024-04-07

## 2024-01-11 MED ORDER — OMEPRAZOLE 20 MG PO CPDR
20.0000 mg | DELAYED_RELEASE_CAPSULE | Freq: Every day | ORAL | 3 refills | Status: AC
Start: 2024-01-11 — End: ?

## 2024-01-11 MED ORDER — PRIMIDONE 50 MG PO TABS
ORAL_TABLET | Freq: Two times a day (BID) | ORAL | 3 refills | Status: AC
Start: 2024-01-11 — End: 2025-01-10

## 2024-01-11 MED ORDER — ATORVASTATIN CALCIUM 40 MG PO TABS
40.0000 mg | ORAL_TABLET | Freq: Every day | ORAL | 3 refills | Status: AC
Start: 2024-01-11 — End: ?

## 2024-03-17 ENCOUNTER — Other Ambulatory Visit: Payer: Self-pay

## 2024-03-17 DIAGNOSIS — I10 Essential (primary) hypertension: Secondary | ICD-10-CM

## 2024-03-17 MED ORDER — LISINOPRIL 5 MG PO TABS
5.0000 mg | ORAL_TABLET | Freq: Every day | ORAL | 3 refills | Status: DC
Start: 2024-03-17 — End: 2024-03-29

## 2024-03-29 ENCOUNTER — Other Ambulatory Visit: Payer: Self-pay

## 2024-03-29 ENCOUNTER — Other Ambulatory Visit: Payer: Self-pay | Admitting: Cardiology

## 2024-03-29 DIAGNOSIS — I10 Essential (primary) hypertension: Secondary | ICD-10-CM

## 2024-03-29 DIAGNOSIS — Z1231 Encounter for screening mammogram for malignant neoplasm of breast: Secondary | ICD-10-CM

## 2024-03-29 MED ORDER — LISINOPRIL 5 MG PO TABS: 5.0000 mg | ORAL_TABLET | Freq: Every day | ORAL | 3 refills | Status: AC

## 2024-04-06 ENCOUNTER — Encounter: Payer: Self-pay | Admitting: Cardiology

## 2024-04-06 ENCOUNTER — Ambulatory Visit (INDEPENDENT_AMBULATORY_CARE_PROVIDER_SITE_OTHER): Admitting: Cardiology

## 2024-04-06 VITALS — BP 130/78 | HR 73 | Ht 64.0 in | Wt 206.8 lb

## 2024-04-06 DIAGNOSIS — E039 Hypothyroidism, unspecified: Secondary | ICD-10-CM

## 2024-04-06 DIAGNOSIS — E782 Mixed hyperlipidemia: Secondary | ICD-10-CM

## 2024-04-06 DIAGNOSIS — R7303 Prediabetes: Secondary | ICD-10-CM | POA: Diagnosis not present

## 2024-04-06 DIAGNOSIS — I10 Essential (primary) hypertension: Secondary | ICD-10-CM

## 2024-04-06 DIAGNOSIS — M25571 Pain in right ankle and joints of right foot: Secondary | ICD-10-CM

## 2024-04-06 NOTE — Progress Notes (Signed)
 Established Patient Office Visit  Subjective:  Patient ID: Rebekah Salinas, female    DOB: 09/29/1955  Age: 68 y.o. MRN: [id number]  Chief Complaint  Patient presents with   Follow-up    4 month follow up    Patient in office for 4 month follow up, did not have fasting lab work done. Will get lab work today. Patient complaining of right ankle pain, states she had this pain previously, wore a boot for 6 months. Saw Dr. Neill in 2014, will refer her back to podiatry.  Continue same medications.     No other concerns at this time.   Past Medical History:  Diagnosis Date   Acute respiratory disease due to COVID-19 virus 10/09/2019   Diabetes mellitus without complication (HCC)    Hand cramps 12/14/2019   Hospital discharge follow-up 10/25/2019   Hypertension    Thyroid  disease     Past Surgical History:  Procedure Laterality Date   ABDOMINAL HYSTERECTOMY     BREAST BIOPSY Left    neg   BREAST BIOPSY Left    core bx- neg   BREAST BIOPSY Left 06/13/2019   [place]  bx 2:00 coil    TUBAL LIGATION      Social History   Socioeconomic History   Marital status: Married    Spouse name: Not on file   Number of children: Not on file   Years of education: Not on file   Highest education level: Not on file  Occupational History   Not on file  Tobacco Use   Smoking status: Never   Smokeless tobacco: Never  Vaping Use   Vaping status: Never Used  Substance and Sexual Activity   Alcohol use: Never   Drug use: Never   Sexual activity: Yes    Birth control/protection: None  Other Topics Concern   Not on file  Social History Narrative   Not on file   Social Drivers of Health   Financial Resource Strain: Not on file  Food Insecurity: No Food Insecurity (04/03/2021)   Hunger Vital Sign    Worried About Running Out of Food in the Last Year: Never true    Ran Out of Food in the Last Year: Never true  Transportation Needs: No Transportation Needs (04/03/2021)   PRAPARE -  Administrator, Civil Service (Medical): No    Lack of Transportation (Non-Medical): No  Physical Activity: Not on file  Stress: Not on file  Social Connections: Not on file  Intimate Partner Violence: Not on file    Family History  Problem Relation Age of Onset   Other Mother        murdered   Other Father        unknown medical history   CAD Sister    Breast cancer Sister 4       twin sister   Cancer Sister    Cancer Maternal Grandmother     Allergies  Allergen Reactions   Aspirin Nausea And Vomiting    Outpatient Medications Prior to Visit  Medication Sig   acetaminophen  (TYLENOL ) 325 MG tablet Take 650 mg by mouth every 6 (six) hours as needed.   atorvastatin  (LIPITOR) 40 MG tablet Take 1 tablet (40 mg total) by mouth daily.   cyclobenzaprine  (FLEXERIL ) 10 MG tablet TAKE ONE TABLET BY MOUTH 2 TIMES A DAY   levothyroxine  (SYNTHROID ) 75 MCG tablet TAKE ONE TABLET BY MOUTH EVERY DAY BEFORE BREAKFAST.   lisinopril  (ZESTRIL ) 5 MG tablet Take  1 tablet (5 mg total) by mouth once daily.   Multiple Vitamin (MULTIVITAMIN WITH MINERALS) TABS tablet Take 1 tablet by mouth daily.   omeprazole  (PRILOSEC) 20 MG capsule Take 1 capsule (20 mg total) by mouth daily.   primidone  (MYSOLINE ) 50 MG tablet TAKE ONE TABLET BY MOUTH 2 TIMES A DAY   hydrochlorothiazide (MICROZIDE) 12.5 MG capsule Take 12.5 mg by mouth daily. (Patient not taking: Reported on 04/06/2024)   ondansetron  (ZOFRAN -ODT) 4 MG disintegrating tablet Take 1 tablet (4 mg total) by mouth every 8 (eight) hours as needed for nausea or vomiting. (Patient not taking: Reported on 04/06/2024)   No facility-administered medications prior to visit.    Review of Systems  Constitutional: Negative.   HENT: Negative.    Eyes: Negative.   Respiratory: Negative.  Negative for shortness of breath.   Cardiovascular: Negative.  Negative for chest pain.  Gastrointestinal: Negative.  Negative for abdominal pain, constipation and  diarrhea.  Genitourinary: Negative.   Musculoskeletal:  Positive for joint pain. Negative for myalgias.  Skin: Negative.   Neurological: Negative.  Negative for dizziness and headaches.  Endo/Heme/Allergies: Negative.   All other systems reviewed and are negative.      Objective:   BP 130/78   Pulse 73   Ht 5' 4 (1.626 m)   Wt 206 lb 12.8 oz (93.8 kg)   SpO2 97%   BMI 35.50 kg/m   Vitals:   04/06/24 0915  BP: 130/78  Pulse: 73  Height: 5' 4 (1.626 m)  Weight: 206 lb 12.8 oz (93.8 kg)  SpO2: 97%  BMI (Calculated): 35.48    Physical Exam Vitals and nursing note reviewed.  Constitutional:      Appearance: Normal appearance. She is normal weight.  HENT:     Head: Normocephalic and atraumatic.     Nose: Nose normal.     Mouth/Throat:     Mouth: Mucous membranes are moist.  Eyes:     Extraocular Movements: Extraocular movements intact.     Conjunctiva/sclera: Conjunctivae normal.     Pupils: Pupils are equal, round, and reactive to light.  Cardiovascular:     Rate and Rhythm: Normal rate and regular rhythm.     Pulses: Normal pulses.     Heart sounds: Normal heart sounds.  Pulmonary:     Effort: Pulmonary effort is normal.     Breath sounds: Normal breath sounds.  Abdominal:     General: Abdomen is flat. Bowel sounds are normal.     Palpations: Abdomen is soft.  Musculoskeletal:        General: Normal range of motion.     Cervical back: Normal range of motion.  Skin:    General: Skin is warm and dry.  Neurological:     General: No focal deficit present.     Mental Status: She is alert and oriented to person, place, and time.  Psychiatric:        Mood and Affect: Mood normal.        Behavior: Behavior normal.        Thought Content: Thought content normal.        Judgment: Judgment normal.      No results found for any visits on 04/06/24.  No results found for this or any previous visit (from the past 2160 hours).    Assessment & Plan:  Fasting  lab work today Referral sent to podiatry Continue same medications  Problem List Items Addressed This Visit  Cardiovascular and Mediastinum   Essential hypertension - Primary   Relevant Orders   CMP14+EGFR     Endocrine   Hypothyroidism   Relevant Orders   TSH     Other   Prediabetes   Relevant Orders   CMP14+EGFR   Hemoglobin A1c   HLD (hyperlipidemia)   Relevant Orders   Lipid Profile   Other Visit Diagnoses       Acute right ankle pain       Relevant Orders   Ambulatory referral to Podiatry       Return in about 4 months (around 08/07/2024) for fasting labs prior.   Total time spent: 25 minutes  Google, NP  04/06/2024   This document may have been prepared by Dragon Voice Recognition software and as such may include unintentional dictation errors.

## 2024-04-07 ENCOUNTER — Ambulatory Visit: Payer: Self-pay | Admitting: Cardiology

## 2024-04-07 ENCOUNTER — Other Ambulatory Visit: Payer: Self-pay | Admitting: Cardiology

## 2024-04-07 DIAGNOSIS — E039 Hypothyroidism, unspecified: Secondary | ICD-10-CM

## 2024-04-07 LAB — CMP14+EGFR
ALT: 14 IU/L (ref 0–32)
AST: 20 IU/L (ref 0–40)
Albumin: 4.2 g/dL (ref 3.9–4.9)
Alkaline Phosphatase: 126 IU/L — ABNORMAL HIGH (ref 44–121)
BUN/Creatinine Ratio: 13 (ref 12–28)
BUN: 15 mg/dL (ref 8–27)
Bilirubin Total: 0.3 mg/dL (ref 0.0–1.2)
CO2: 24 mmol/L (ref 20–29)
Calcium: 9.4 mg/dL (ref 8.7–10.3)
Chloride: 102 mmol/L (ref 96–106)
Creatinine, Ser: 1.19 mg/dL — ABNORMAL HIGH (ref 0.57–1.00)
Globulin, Total: 2.8 g/dL (ref 1.5–4.5)
Glucose: 95 mg/dL (ref 70–99)
Potassium: 4.1 mmol/L (ref 3.5–5.2)
Sodium: 141 mmol/L (ref 134–144)
Total Protein: 7 g/dL (ref 6.0–8.5)
eGFR: 50 mL/min/1.73 — ABNORMAL LOW (ref >59)

## 2024-04-07 LAB — TSH: TSH: 8.98 u[IU]/mL — ABNORMAL HIGH (ref 0.450–4.500)

## 2024-04-07 LAB — HEMOGLOBIN A1C
Est. average glucose Bld gHb Est-mCnc: 123 mg/dL
Hgb A1c MFr Bld: 5.9 % — ABNORMAL HIGH (ref 4.8–5.6)

## 2024-04-07 LAB — LIPID PANEL
Chol/HDL Ratio: 3.5 ratio (ref 0.0–4.4)
Cholesterol, Total: 163 mg/dL (ref 100–199)
HDL: 47 mg/dL (ref >39)
LDL Chol Calc (NIH): 104 mg/dL — ABNORMAL HIGH (ref 0–99)
Triglycerides: 63 mg/dL (ref 0–149)
VLDL Cholesterol Cal: 12 mg/dL (ref 5–40)

## 2024-04-07 MED ORDER — LEVOTHYROXINE SODIUM 100 MCG PO TABS: 100.0000 ug | ORAL_TABLET | Freq: Every day | ORAL | 11 refills | Status: AC

## 2024-04-07 NOTE — Progress Notes (Signed)
 Left vm to call back

## 2024-04-07 NOTE — Progress Notes (Signed)
Pt informed

## 2024-04-08 ENCOUNTER — Ambulatory Visit: Admitting: Cardiology

## 2024-04-26 ENCOUNTER — Ambulatory Visit: Admitting: Podiatry

## 2024-04-26 DIAGNOSIS — M7661 Achilles tendinitis, right leg: Secondary | ICD-10-CM | POA: Diagnosis not present

## 2024-04-26 NOTE — Progress Notes (Signed)
 Subjective:  Patient ID: Rebekah Salinas, female    DOB: [birth year],  MRN: [id number]  Chief Complaint  Patient presents with   Foot Pain    Right ankle discomfort     68 y.o. female presents with the above complaint.  Patient presents with complaint of right Achilles tendon insertional pain pain with ambulation.  She states it hurts with every single step wearing get it evaluated because 5 out of 10 dull achy in nature hurts with ambulation hurts with pressure pain with dorsiflexion of the toes.   Review of Systems: Negative except as noted in the HPI. Denies N/V/F/Ch.  Past Medical History:  Diagnosis Date   Acute respiratory disease due to COVID-19 virus 10/09/2019   Diabetes mellitus without complication (HCC)    Hand cramps 12/14/2019   Hospital discharge follow-up 10/25/2019   Hypertension    Thyroid  disease     Current Outpatient Medications:    levothyroxine  (SYNTHROID ) 100 MCG tablet, Take 1 tablet (100 mcg total) by mouth daily., Disp: 30 tablet, Rfl: 11   acetaminophen  (TYLENOL ) 325 MG tablet, Take 650 mg by mouth every 6 (six) hours as needed., Disp: , Rfl:    atorvastatin  (LIPITOR) 40 MG tablet, Take 1 tablet (40 mg total) by mouth daily., Disp: 90 tablet, Rfl: 3   cyclobenzaprine  (FLEXERIL ) 10 MG tablet, TAKE ONE TABLET BY MOUTH 2 TIMES A DAY, Disp: 180 tablet, Rfl: 3   hydrochlorothiazide (MICROZIDE) 12.5 MG capsule, Take 12.5 mg by mouth daily. (Patient not taking: Reported on 04/06/2024), Disp: , Rfl:    lisinopril  (ZESTRIL ) 5 MG tablet, Take 1 tablet (5 mg total) by mouth once daily., Disp: 100 tablet, Rfl: 3   Multiple Vitamin (MULTIVITAMIN WITH MINERALS) TABS tablet, Take 1 tablet by mouth daily., Disp: , Rfl:    omeprazole  (PRILOSEC) 20 MG capsule, Take 1 capsule (20 mg total) by mouth daily., Disp: 90 capsule, Rfl: 3   ondansetron  (ZOFRAN -ODT) 4 MG disintegrating tablet, Take 1 tablet (4 mg total) by mouth every 8 (eight) hours as needed for nausea or vomiting.  (Patient not taking: Reported on 04/06/2024), Disp: 30 tablet, Rfl: 0   primidone  (MYSOLINE ) 50 MG tablet, TAKE ONE TABLET BY MOUTH 2 TIMES A DAY, Disp: 180 tablet, Rfl: 3  Social History   Tobacco Use  Smoking Status Never  Smokeless Tobacco Never    Allergies  Allergen Reactions   Aspirin Nausea And Vomiting   Objective:  There were no vitals filed for this visit. There is no height or weight on file to calculate BMI. Constitutional Well developed. Well nourished.  Vascular Dorsalis pedis pulses palpable bilaterally. Posterior tibial pulses palpable bilaterally. Capillary refill normal to all digits.  No cyanosis or clubbing noted. Pedal hair growth normal.  Neurologic Normal speech. Oriented to person, place, and time. Epicritic sensation to light touch grossly present bilaterally.  Dermatologic Nails well groomed and normal in appearance. No open wounds. No skin lesions.  Orthopedic: Pain on palpation to the right Achilles tendon pain with dorsiflexion of the joint no pain with plantarflexion of the joint positive heel prescribed test noted.  Positive Silfverskiold test noted gastrocnemius equinus positive Haglund's deformity noted   Radiographs: None Assessment:  No diagnosis found. Plan:  Patient was evaluated and treated and all questions answered.  Right Achilles tendinitis insertional pain - All questions and concerns were discussed with the patient in extensive detail given the amount of pain that she is experiencing she would benefit from a cam boot immobilization to  the right leg.  Patient is in agreement with the plan - Cam boot was dispensed - If there is no improvement we discussed MRI versus steroid injection during next visit  No follow-ups on file.

## 2024-04-28 ENCOUNTER — Encounter

## 2024-05-24 ENCOUNTER — Ambulatory Visit: Admitting: Podiatry

## 2024-05-24 DIAGNOSIS — M7661 Achilles tendinitis, right leg: Secondary | ICD-10-CM

## 2024-05-24 NOTE — Progress Notes (Signed)
 Subjective:  Patient ID: Rebekah Salinas, female    DOB: 04-25-1956,  MRN: [id number]  Chief Complaint  Patient presents with   Right Achilles tendinitis    Pt stated that she is doing better than she was     68 y.o. female presents with the above complaint.  Presents for follow-up of right Achilles tendinitis she states she is doing better.  The cam boot immobilization helped denies any other acute complaints.  Review of Systems: Negative except as noted in the HPI. Denies N/V/F/Ch.  Past Medical History:  Diagnosis Date   Acute respiratory disease due to COVID-19 virus 10/09/2019   Diabetes mellitus without complication (HCC)    Hand cramps 12/14/2019   Hospital discharge follow-up 10/25/2019   Hypertension    Thyroid  disease     Current Outpatient Medications:    levothyroxine  (SYNTHROID ) 100 MCG tablet, Take 1 tablet (100 mcg total) by mouth daily., Disp: 30 tablet, Rfl: 11   acetaminophen  (TYLENOL ) 325 MG tablet, Take 650 mg by mouth every 6 (six) hours as needed., Disp: , Rfl:    atorvastatin  (LIPITOR) 40 MG tablet, Take 1 tablet (40 mg total) by mouth daily., Disp: 90 tablet, Rfl: 3   cyclobenzaprine  (FLEXERIL ) 10 MG tablet, TAKE ONE TABLET BY MOUTH 2 TIMES A DAY, Disp: 180 tablet, Rfl: 3   hydrochlorothiazide (MICROZIDE) 12.5 MG capsule, Take 12.5 mg by mouth daily. (Patient not taking: Reported on 04/06/2024), Disp: , Rfl:    lisinopril  (ZESTRIL ) 5 MG tablet, Take 1 tablet (5 mg total) by mouth once daily., Disp: 100 tablet, Rfl: 3   Multiple Vitamin (MULTIVITAMIN WITH MINERALS) TABS tablet, Take 1 tablet by mouth daily., Disp: , Rfl:    omeprazole  (PRILOSEC) 20 MG capsule, Take 1 capsule (20 mg total) by mouth daily., Disp: 90 capsule, Rfl: 3   ondansetron  (ZOFRAN -ODT) 4 MG disintegrating tablet, Take 1 tablet (4 mg total) by mouth every 8 (eight) hours as needed for nausea or vomiting. (Patient not taking: Reported on 04/06/2024), Disp: 30 tablet, Rfl: 0   primidone   (MYSOLINE ) 50 MG tablet, TAKE ONE TABLET BY MOUTH 2 TIMES A DAY, Disp: 180 tablet, Rfl: 3  Social History   Tobacco Use  Smoking Status Never  Smokeless Tobacco Never    Allergies  Allergen Reactions   Aspirin Nausea And Vomiting   Objective:  There were no vitals filed for this visit. There is no height or weight on file to calculate BMI. Constitutional Well developed. Well nourished.  Vascular Dorsalis pedis pulses palpable bilaterally. Posterior tibial pulses palpable bilaterally. Capillary refill normal to all digits.  No cyanosis or clubbing noted. Pedal hair growth normal.  Neurologic Normal speech. Oriented to person, place, and time. Epicritic sensation to light touch grossly present bilaterally.  Dermatologic Nails well groomed and normal in appearance. No open wounds. No skin lesions.  Orthopedic: Pain on palpation to the right Achilles tendon pain with dorsiflexion of the joint no pain with plantarflexion of the joint positive heel prescribed test noted.  Positive Silfverskiold test noted gastrocnemius equinus positive Haglund's deformity noted   Radiographs: None Assessment:   1. Right Achilles tendinitis    Plan:  Patient was evaluated and treated and all questions answered.  Right Achilles tendinitis insertional pain - Clinically at this time she has had much improvement.  She also has allergies to lidocaine and other drugs therefore I will hold off on steroid injection for now.  She would benefit from transition out of boot into regular shoes  with Tri-Lock ankle brace Tri-Lock ankle brace was dispensed.  No follow-ups on file.

## 2024-06-07 ENCOUNTER — Ambulatory Visit
Admission: RE | Admit: 2024-06-07 | Discharge: 2024-06-07 | Disposition: A | Discharge status: Home / Self Care | Source: Ambulatory Visit | Attending: Cardiology | Admitting: Cardiology

## 2024-06-07 DIAGNOSIS — Z1231 Encounter for screening mammogram for malignant neoplasm of breast: Secondary | ICD-10-CM | POA: Insufficient documentation

## 2024-06-09 ENCOUNTER — Ambulatory Visit: Payer: Self-pay | Admitting: Cardiology

## 2024-06-09 NOTE — Progress Notes (Signed)
Pt informed

## 2024-08-15 ENCOUNTER — Ambulatory Visit (INDEPENDENT_AMBULATORY_CARE_PROVIDER_SITE_OTHER): Admitting: Cardiology

## 2024-08-15 ENCOUNTER — Encounter: Payer: Self-pay | Admitting: Cardiology

## 2024-08-15 VITALS — BP 116/82 | HR 76 | Ht 64.0 in | Wt 213.4 lb

## 2024-08-15 DIAGNOSIS — R7303 Prediabetes: Secondary | ICD-10-CM

## 2024-08-15 DIAGNOSIS — E039 Hypothyroidism, unspecified: Secondary | ICD-10-CM

## 2024-08-15 DIAGNOSIS — Z23 Encounter for immunization: Secondary | ICD-10-CM | POA: Diagnosis not present

## 2024-08-15 DIAGNOSIS — I10 Essential (primary) hypertension: Secondary | ICD-10-CM | POA: Diagnosis not present

## 2024-08-15 DIAGNOSIS — E66812 Obesity, class 2: Secondary | ICD-10-CM

## 2024-08-15 DIAGNOSIS — E782 Mixed hyperlipidemia: Secondary | ICD-10-CM

## 2024-08-15 DIAGNOSIS — Z6836 Body mass index (BMI) 36.0-36.9, adult: Secondary | ICD-10-CM | POA: Insufficient documentation

## 2024-08-15 NOTE — Progress Notes (Signed)
 Established Patient Office Visit  Subjective:  Patient ID: Rebekah Salinas, female    DOB: 05/26/1956  Age: 68 y.o. MRN: [id number]  Chief Complaint  Patient presents with   Follow-up    4 month lab results    Patient in office for 4 month follow up, discuss recent lab results. Patient doing well no complaints today. Patient requesting the flu vaccine. Did not have blood work done, will do fasting lab work today.  Up to date on mammogram.  Due for colonoscopy. Will order at next visit.  Continue current medications.     No other concerns at this time.   Past Medical History:  Diagnosis Date   Acute respiratory disease due to COVID-19 virus 10/09/2019   Diabetes mellitus without complication (HCC)    Hand cramps 12/14/2019   Hospital discharge follow-up 10/25/2019   Hypertension    Thyroid  disease     Past Surgical History:  Procedure Laterality Date   ABDOMINAL HYSTERECTOMY     BREAST BIOPSY Left    neg   BREAST BIOPSY Left    core bx- neg   BREAST BIOPSY Left 06/13/2019   us  bx 2:00 coil    TUBAL LIGATION      Social History   Socioeconomic History   Marital status: Married    Spouse name: Not on file   Number of children: Not on file   Years of education: Not on file   Highest education level: Not on file  Occupational History   Not on file  Tobacco Use   Smoking status: Never   Smokeless tobacco: Never  Vaping Use   Vaping status: Never Used  Substance and Sexual Activity   Alcohol use: Never   Drug use: Never   Sexual activity: Yes    Birth control/protection: None  Other Topics Concern   Not on file  Social History Narrative   Not on file   Social Drivers of Health   Financial Resource Strain: Not on file  Food Insecurity: No Food Insecurity (04/03/2021)   Hunger Vital Sign    Worried About Running Out of Food in the Last Year: Never true    Ran Out of Food in the Last Year: Never true  Transportation Needs: No Transportation Needs  (04/03/2021)   PRAPARE - Administrator, Civil Service (Medical): No    Lack of Transportation (Non-Medical): No  Physical Activity: Not on file  Stress: Not on file  Social Connections: Not on file  Intimate Partner Violence: Not on file    Family History  Problem Relation Age of Onset   Other Mother        murdered   Other Father        unknown medical history   CAD Sister    Breast cancer Sister 77       twin sister   Cancer Sister    Cancer Maternal Grandmother     Allergies  Allergen Reactions   Aspirin Nausea And Vomiting    Outpatient Medications Prior to Visit  Medication Sig   acetaminophen  (TYLENOL ) 325 MG tablet Take 650 mg by mouth every 6 (six) hours as needed.   atorvastatin  (LIPITOR) 40 MG tablet Take 1 tablet (40 mg total) by mouth daily.   cyclobenzaprine  (FLEXERIL ) 10 MG tablet TAKE ONE TABLET BY MOUTH 2 TIMES A DAY   levothyroxine  (SYNTHROID ) 100 MCG tablet Take 1 tablet (100 mcg total) by mouth daily.   lisinopril  (ZESTRIL ) 5 MG tablet  Take 1 tablet (5 mg total) by mouth once daily.   Multiple Vitamin (MULTIVITAMIN WITH MINERALS) TABS tablet Take 1 tablet by mouth daily.   omeprazole  (PRILOSEC) 20 MG capsule Take 1 capsule (20 mg total) by mouth daily.   primidone  (MYSOLINE ) 50 MG tablet TAKE ONE TABLET BY MOUTH 2 TIMES A DAY   hydrochlorothiazide (MICROZIDE) 12.5 MG capsule Take 12.5 mg by mouth daily. (Patient not taking: Reported on 08/15/2024)   [DISCONTINUED] ondansetron  (ZOFRAN -ODT) 4 MG disintegrating tablet Take 1 tablet (4 mg total) by mouth every 8 (eight) hours as needed for nausea or vomiting. (Patient not taking: Reported on 08/15/2024)   No facility-administered medications prior to visit.    Review of Systems  Constitutional: Negative.   HENT: Negative.    Eyes: Negative.   Respiratory: Negative.  Negative for shortness of breath.   Cardiovascular: Negative.  Negative for chest pain.  Gastrointestinal: Negative.  Negative  for abdominal pain, constipation and diarrhea.  Genitourinary: Negative.   Musculoskeletal:  Negative for joint pain and myalgias.  Skin: Negative.   Neurological: Negative.  Negative for dizziness and headaches.  Endo/Heme/Allergies: Negative.   All other systems reviewed and are negative.      Objective:   BP 116/82 (BP Location: Right Arm, Patient Position: Sitting, Cuff Size: Large)   Pulse 76   Ht 5' 4 (1.626 m)   Wt 213 lb 6.4 oz (96.8 kg)   SpO2 97%   BMI 36.63 kg/m   Vitals:   08/15/24 0914 08/15/24 0933  BP: 138/84 116/82  Pulse: 76   Height: 5' 4 (1.626 m)   Weight: 213 lb 6.4 oz (96.8 kg)   SpO2: 97%   BMI (Calculated): 36.61     Physical Exam Vitals and nursing note reviewed.  Constitutional:      Appearance: Normal appearance. She is normal weight.  HENT:     Head: Normocephalic and atraumatic.     Nose: Nose normal.     Mouth/Throat:     Mouth: Mucous membranes are moist.  Eyes:     Extraocular Movements: Extraocular movements intact.     Conjunctiva/sclera: Conjunctivae normal.     Pupils: Pupils are equal, round, and reactive to light.  Cardiovascular:     Rate and Rhythm: Normal rate and regular rhythm.     Pulses: Normal pulses.     Heart sounds: Normal heart sounds.  Pulmonary:     Effort: Pulmonary effort is normal.     Breath sounds: Normal breath sounds.  Abdominal:     General: Abdomen is flat. Bowel sounds are normal.     Palpations: Abdomen is soft.  Musculoskeletal:        General: Normal range of motion.     Cervical back: Normal range of motion.  Skin:    General: Skin is warm and dry.  Neurological:     General: No focal deficit present.     Mental Status: She is alert and oriented to person, place, and time.  Psychiatric:        Mood and Affect: Mood normal.        Behavior: Behavior normal.        Thought Content: Thought content normal.        Judgment: Judgment normal.      No results found for any visits on  08/15/24.  No results found for this or any previous visit (from the past 2160 hours).    Assessment & Plan:  Flu vaccine today  Fasting lab work today Continue current medications  Problem List Items Addressed This Visit       Cardiovascular and Mediastinum   Essential hypertension - Primary   Relevant Orders   CMP14+EGFR     Endocrine   Hypothyroidism   Relevant Orders   TSH     Other   Prediabetes   Relevant Orders   Hemoglobin A1c   HLD (hyperlipidemia)   Relevant Orders   Lipid Profile   Class 2 severe obesity due to excess calories with serious comorbidity and body mass index (BMI) of 36.0 to 36.9 in adult   Other Visit Diagnoses       Flu vaccine need       Relevant Orders   Flu Vaccine Trivalent High Dose (Fluad) (Completed)       Return in about 4 months (around 12/13/2024) for fasting lab work prior.   Total time spent: 25 minutes. This time includes review of previous notes and results and patient face to face interaction during today's visit.    Jeoffrey Pollen, NP  08/15/2024   This document may have been prepared by Dragon Voice Recognition software and as such may include unintentional dictation errors.

## 2024-08-16 ENCOUNTER — Ambulatory Visit: Payer: Self-pay | Admitting: Cardiology

## 2024-08-16 LAB — CMP14+EGFR
ALT: 15 IU/L (ref 0–32)
AST: 20 IU/L (ref 0–40)
Albumin: 4.1 g/dL (ref 3.9–4.9)
Alkaline Phosphatase: 107 IU/L (ref 49–135)
BUN/Creatinine Ratio: 16 (ref 12–28)
BUN: 19 mg/dL (ref 8–27)
Bilirubin Total: 0.6 mg/dL (ref 0.0–1.2)
CO2: 20 mmol/L (ref 20–29)
Calcium: 8.9 mg/dL (ref 8.7–10.3)
Chloride: 104 mmol/L (ref 96–106)
Creatinine, Ser: 1.18 mg/dL — ABNORMAL HIGH (ref 0.57–1.00)
Globulin, Total: 2.8 g/dL (ref 1.5–4.5)
Glucose: 96 mg/dL (ref 70–99)
Potassium: 4.3 mmol/L (ref 3.5–5.2)
Sodium: 139 mmol/L (ref 134–144)
Total Protein: 6.9 g/dL (ref 6.0–8.5)
eGFR: 51 mL/min/1.73 — ABNORMAL LOW (ref >59)

## 2024-08-16 LAB — TSH: TSH: 3.2 u[IU]/mL (ref 0.450–4.500)

## 2024-08-16 LAB — LIPID PANEL
Chol/HDL Ratio: 3.5 ratio (ref 0.0–4.4)
Cholesterol, Total: 149 mg/dL (ref 100–199)
HDL: 42 mg/dL (ref >39)
LDL Chol Calc (NIH): 92 mg/dL (ref 0–99)
Triglycerides: 78 mg/dL (ref 0–149)
VLDL Cholesterol Cal: 15 mg/dL (ref 5–40)

## 2024-08-16 LAB — HEMOGLOBIN A1C
Est. average glucose Bld gHb Est-mCnc: 128 mg/dL
Hgb A1c MFr Bld: 6.1 % — ABNORMAL HIGH (ref 4.8–5.6)

## 2024-08-16 NOTE — Progress Notes (Signed)
Pt informed

## 2024-09-26 ENCOUNTER — Other Ambulatory Visit: Payer: Self-pay

## 2024-09-26 DIAGNOSIS — M542 Cervicalgia: Secondary | ICD-10-CM

## 2024-09-26 DIAGNOSIS — G8929 Other chronic pain: Secondary | ICD-10-CM

## 2024-09-26 MED ORDER — CYCLOBENZAPRINE HCL 10 MG PO TABS: ORAL_TABLET | Freq: Two times a day (BID) | ORAL | 0 refills | Status: AC

## 2024-12-13 ENCOUNTER — Ambulatory Visit: Admitting: Cardiology
